# Patient Record
Sex: Female | Born: 1985 | Race: Black or African American | Hispanic: No | Marital: Single | Smoking: Former smoker
Health system: Southern US, Community
[De-identification: ages and names within clinical notes are randomized; demographics above are authoritative.]

## PROBLEM LIST (undated history)

## (undated) DIAGNOSIS — M25519 Pain in unspecified shoulder: Secondary | ICD-10-CM

## (undated) DIAGNOSIS — D649 Anemia, unspecified: Secondary | ICD-10-CM

## (undated) DIAGNOSIS — F419 Anxiety disorder, unspecified: Secondary | ICD-10-CM

## (undated) HISTORY — PX: EUSTACHIAN TUBE DILATION: SHX6770

## (undated) HISTORY — DX: Anemia, unspecified: D64.9

## (undated) HISTORY — DX: Anxiety disorder, unspecified: F41.9

## (undated) HISTORY — PX: TONSILLECTOMY: SUR1361

---

## 2003-01-11 ENCOUNTER — Emergency Department (HOSPITAL_COMMUNITY): Admission: EM | Admit: 2003-01-11 | Discharge: 2003-01-11 | Payer: Self-pay | Admitting: Emergency Medicine

## 2003-02-08 ENCOUNTER — Encounter: Admission: RE | Admit: 2003-02-08 | Discharge: 2003-02-08 | Payer: Self-pay | Admitting: Cardiology

## 2003-02-08 ENCOUNTER — Encounter: Payer: Self-pay | Admitting: Cardiology

## 2003-06-05 ENCOUNTER — Emergency Department (HOSPITAL_COMMUNITY): Admission: AD | Admit: 2003-06-05 | Discharge: 2003-06-05 | Payer: Self-pay | Admitting: Family Medicine

## 2003-08-31 ENCOUNTER — Emergency Department (HOSPITAL_COMMUNITY): Admission: AD | Admit: 2003-08-31 | Discharge: 2003-08-31 | Payer: Self-pay | Admitting: Family Medicine

## 2005-01-14 ENCOUNTER — Ambulatory Visit (HOSPITAL_COMMUNITY): Admission: RE | Admit: 2005-01-14 | Discharge: 2005-01-14 | Payer: Self-pay | Admitting: Obstetrics & Gynecology

## 2005-01-22 HISTORY — PX: DILATION AND EVACUATION: SHX1459

## 2005-11-01 ENCOUNTER — Emergency Department (HOSPITAL_COMMUNITY): Admission: EM | Admit: 2005-11-01 | Discharge: 2005-11-01 | Payer: Self-pay | Admitting: Family Medicine

## 2006-05-09 ENCOUNTER — Emergency Department (HOSPITAL_COMMUNITY): Admission: EM | Admit: 2006-05-09 | Discharge: 2006-05-09 | Payer: Self-pay | Admitting: Family Medicine

## 2006-12-27 ENCOUNTER — Emergency Department (HOSPITAL_COMMUNITY): Admission: EM | Admit: 2006-12-27 | Discharge: 2006-12-27 | Payer: Self-pay | Admitting: Family Medicine

## 2007-02-15 ENCOUNTER — Ambulatory Visit (HOSPITAL_COMMUNITY): Admission: RE | Admit: 2007-02-15 | Discharge: 2007-02-15 | Payer: Self-pay | Admitting: Family Medicine

## 2007-03-15 ENCOUNTER — Ambulatory Visit (HOSPITAL_COMMUNITY): Admission: RE | Admit: 2007-03-15 | Discharge: 2007-03-15 | Payer: Self-pay | Admitting: Obstetrics & Gynecology

## 2007-03-24 ENCOUNTER — Ambulatory Visit (HOSPITAL_COMMUNITY): Admission: RE | Admit: 2007-03-24 | Discharge: 2007-03-24 | Payer: Self-pay | Admitting: Obstetrics & Gynecology

## 2007-04-13 ENCOUNTER — Ambulatory Visit (HOSPITAL_COMMUNITY): Admission: RE | Admit: 2007-04-13 | Discharge: 2007-04-13 | Payer: Self-pay | Admitting: Obstetrics & Gynecology

## 2007-05-11 ENCOUNTER — Ambulatory Visit (HOSPITAL_COMMUNITY): Admission: RE | Admit: 2007-05-11 | Discharge: 2007-05-11 | Payer: Self-pay | Admitting: Obstetrics & Gynecology

## 2007-05-24 ENCOUNTER — Emergency Department (HOSPITAL_COMMUNITY): Admission: EM | Admit: 2007-05-24 | Discharge: 2007-05-24 | Payer: Self-pay | Admitting: Emergency Medicine

## 2007-06-02 ENCOUNTER — Inpatient Hospital Stay (HOSPITAL_COMMUNITY): Admission: AD | Admit: 2007-06-02 | Discharge: 2007-06-28 | Payer: Self-pay | Admitting: Family Medicine

## 2007-06-06 ENCOUNTER — Encounter: Payer: Self-pay | Admitting: Obstetrics & Gynecology

## 2007-06-07 ENCOUNTER — Encounter: Payer: Self-pay | Admitting: Obstetrics & Gynecology

## 2007-06-08 ENCOUNTER — Encounter: Payer: Self-pay | Admitting: Obstetrics & Gynecology

## 2007-06-16 ENCOUNTER — Encounter: Payer: Self-pay | Admitting: Obstetrics & Gynecology

## 2007-06-28 ENCOUNTER — Encounter: Payer: Self-pay | Admitting: Obstetrics & Gynecology

## 2007-07-02 ENCOUNTER — Inpatient Hospital Stay (HOSPITAL_COMMUNITY): Admission: AD | Admit: 2007-07-02 | Discharge: 2007-07-05 | Payer: Self-pay | Admitting: Obstetrics & Gynecology

## 2007-07-02 ENCOUNTER — Encounter: Payer: Self-pay | Admitting: Obstetrics & Gynecology

## 2008-08-26 ENCOUNTER — Emergency Department (HOSPITAL_COMMUNITY): Admission: EM | Admit: 2008-08-26 | Discharge: 2008-08-27 | Payer: Self-pay | Admitting: Emergency Medicine

## 2008-12-07 IMAGING — US US OB DETAIL+14 WK
1 series · 14 of 28 positions shown · non-contrast
Comparison: none

OBSTETRICAL ULTRASOUND:
 This ultrasound was performed in The [HOSPITAL], and the [facility] report will be stored to [REDACTED] PACS.

[Series 1: us ob detail+14 wk · 14 of 145 slices shown]
[im 6/145]
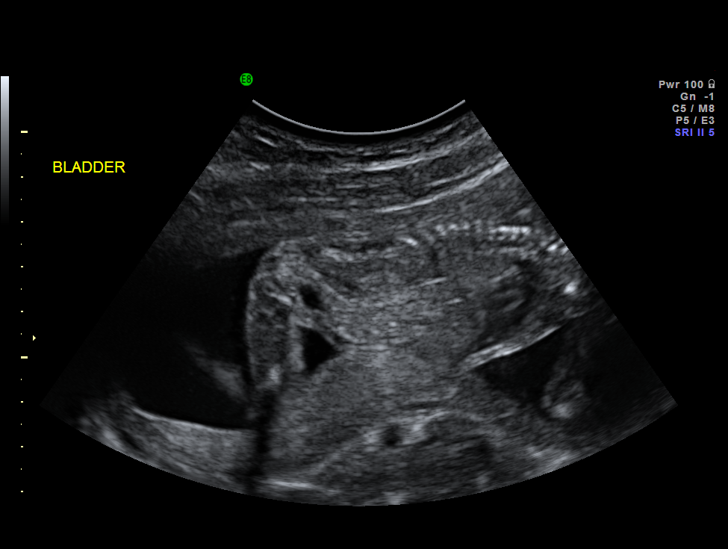
[im 17/145]
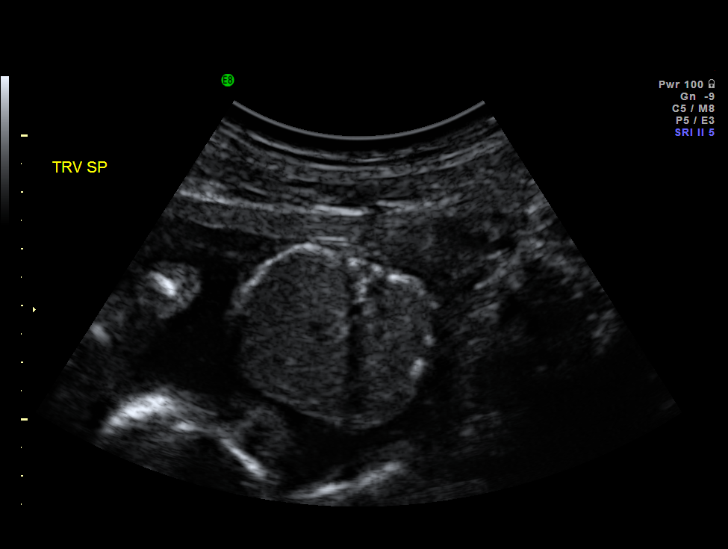
[im 27/145]
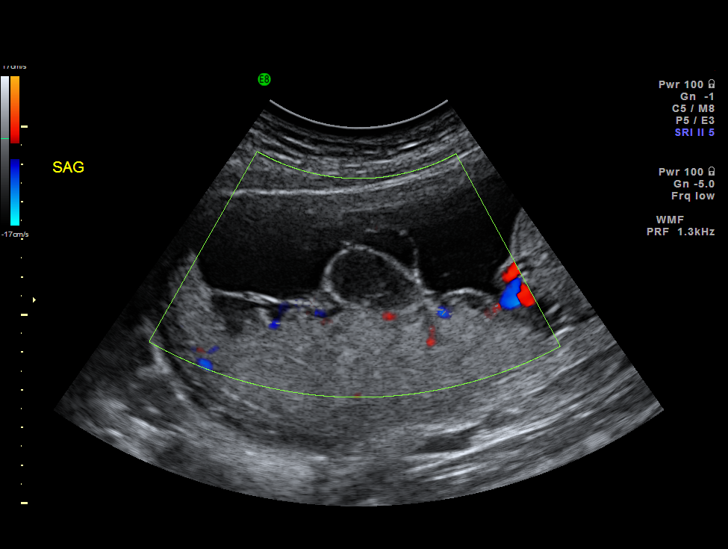
[im 38/145]
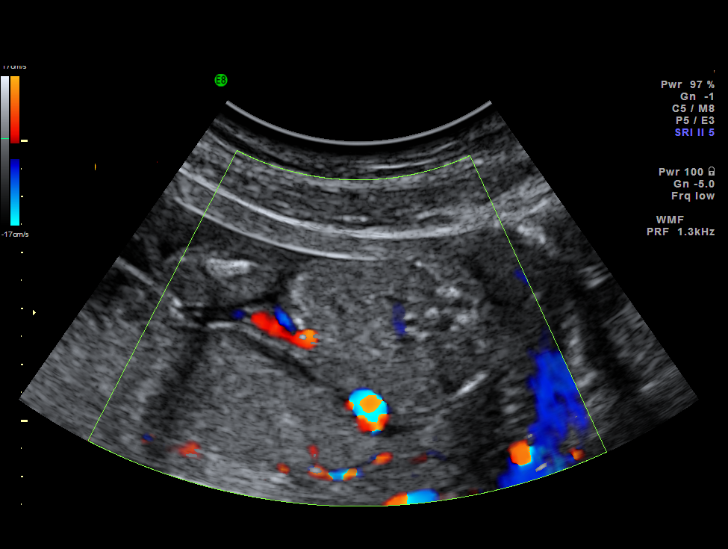
[im 49/145]
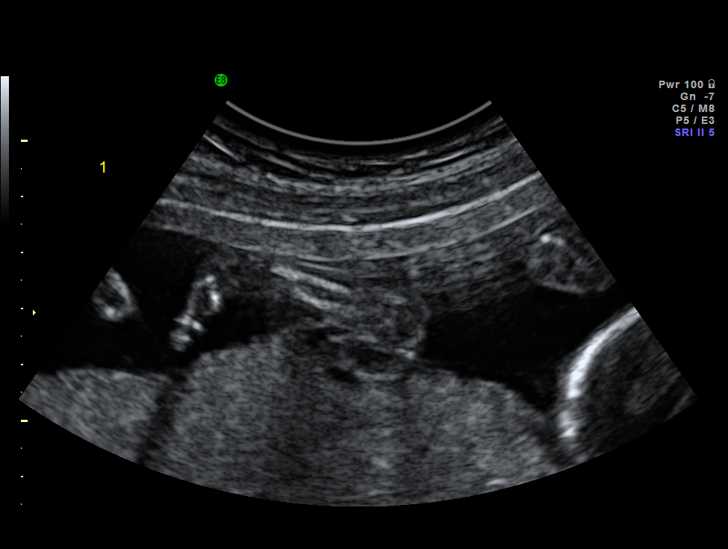
[im 59/145]
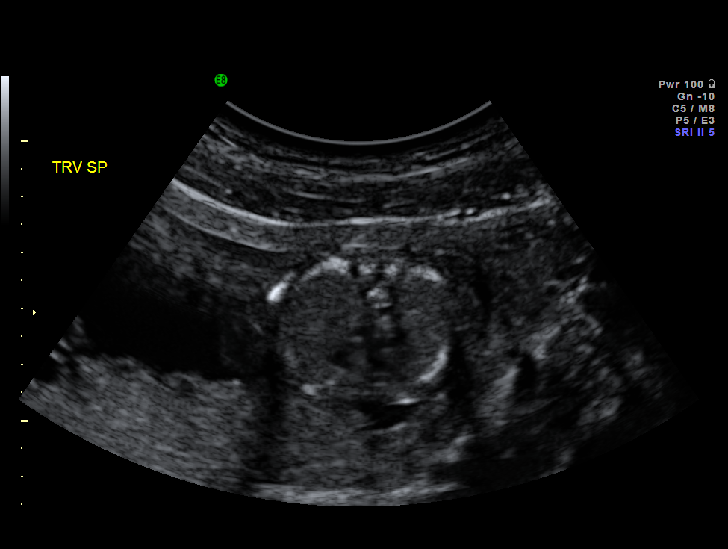
[im 70/145]
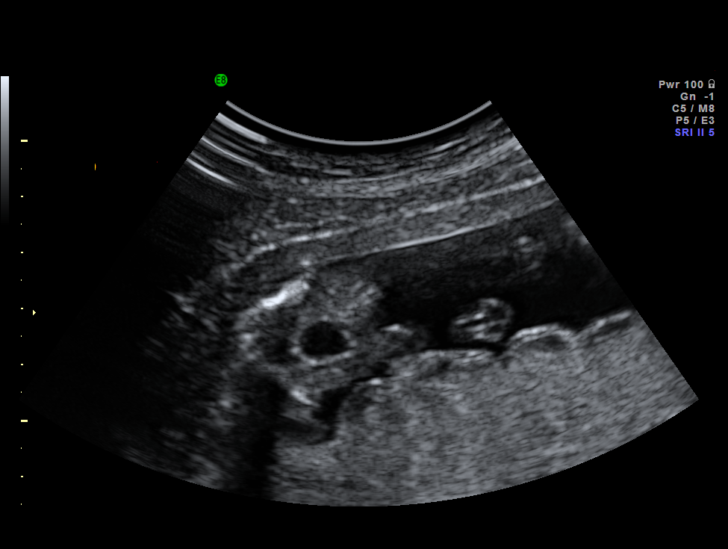
[im 81/145]
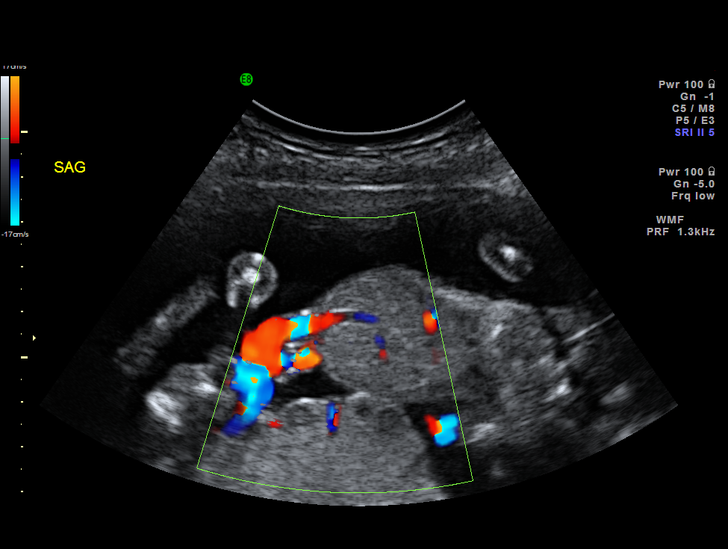
[im 91/145]
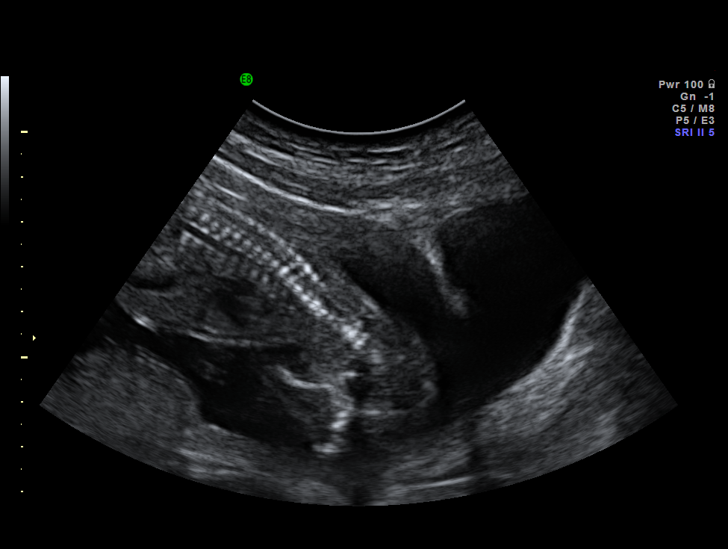
[im 102/145]
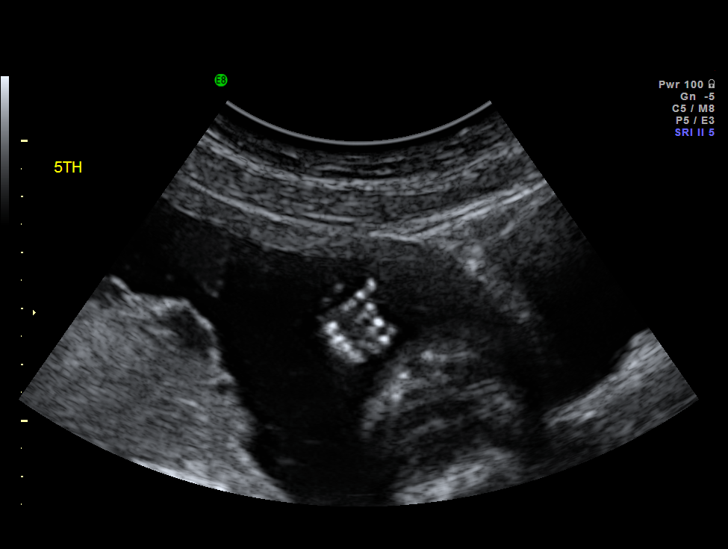
[im 113/145]
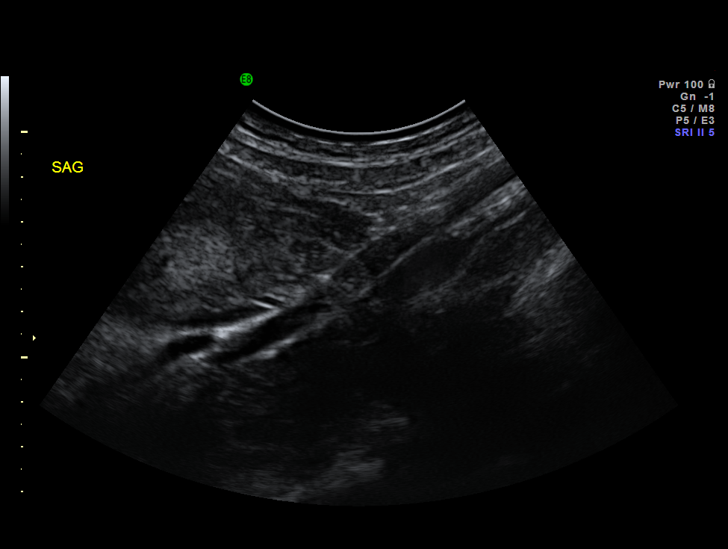
[im 123/145]
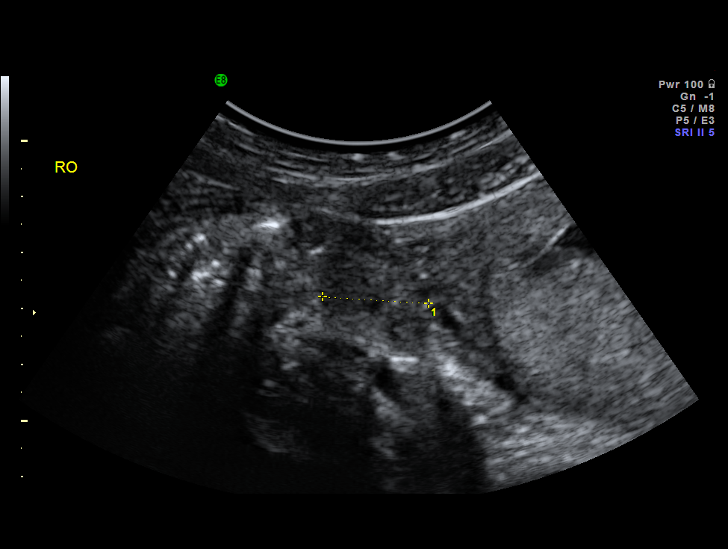
[im 134/145]
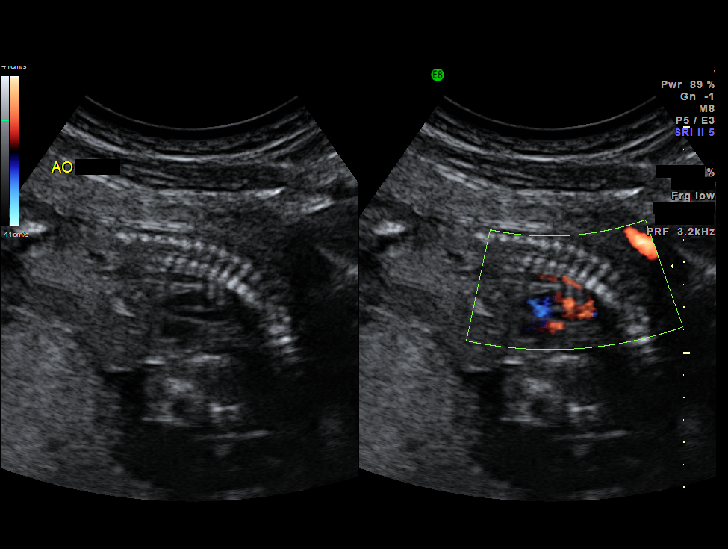
[im 145/145]
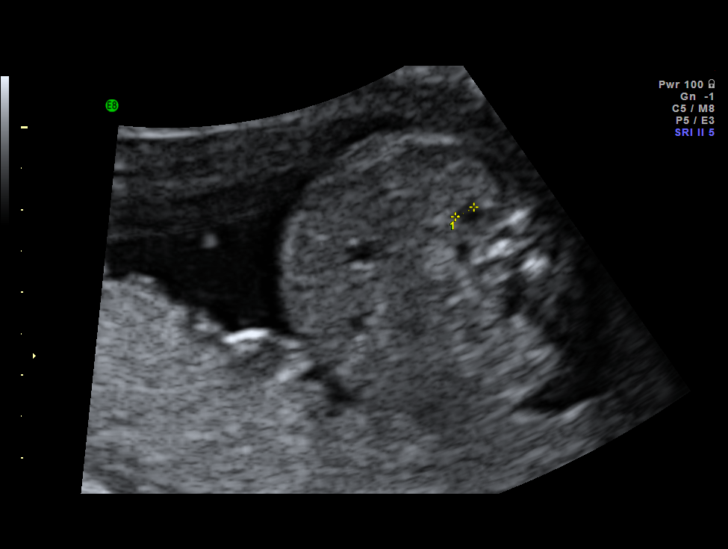

[14 of 28 positions shown; findings below may reference images not displayed]

IMPRESSION: The [facility] report has also been faxed to the ordering physician.

## 2009-03-08 ENCOUNTER — Emergency Department (HOSPITAL_COMMUNITY): Admission: EM | Admit: 2009-03-08 | Discharge: 2009-03-08 | Payer: Self-pay | Admitting: Emergency Medicine

## 2009-10-08 ENCOUNTER — Ambulatory Visit: Payer: Self-pay | Admitting: Internal Medicine

## 2009-10-24 ENCOUNTER — Ambulatory Visit (HOSPITAL_COMMUNITY): Admission: RE | Admit: 2009-10-24 | Discharge: 2009-10-24 | Payer: Self-pay | Admitting: *Deleted

## 2009-12-17 ENCOUNTER — Ambulatory Visit: Payer: Self-pay | Admitting: Obstetrics & Gynecology

## 2009-12-18 ENCOUNTER — Encounter (INDEPENDENT_AMBULATORY_CARE_PROVIDER_SITE_OTHER): Payer: Self-pay | Admitting: *Deleted

## 2009-12-18 LAB — CONVERTED CEMR LAB: Trich, Wet Prep: NONE SEEN

## 2010-01-29 ENCOUNTER — Ambulatory Visit: Payer: Self-pay | Admitting: Obstetrics & Gynecology

## 2010-01-29 ENCOUNTER — Ambulatory Visit (HOSPITAL_COMMUNITY): Admission: RE | Admit: 2010-01-29 | Discharge: 2010-01-29 | Payer: Self-pay | Admitting: Obstetrics & Gynecology

## 2010-02-19 ENCOUNTER — Ambulatory Visit: Payer: Self-pay | Admitting: Obstetrics and Gynecology

## 2010-06-18 ENCOUNTER — Emergency Department (HOSPITAL_COMMUNITY)
Admission: EM | Admit: 2010-06-18 | Discharge: 2010-06-18 | Payer: Self-pay | Source: Home / Self Care | Admitting: Emergency Medicine

## 2010-07-19 ENCOUNTER — Encounter: Payer: Self-pay | Admitting: Obstetrics & Gynecology

## 2010-07-19 ENCOUNTER — Encounter: Payer: Self-pay | Admitting: *Deleted

## 2010-07-20 ENCOUNTER — Encounter: Payer: Self-pay | Admitting: Obstetrics

## 2010-09-07 LAB — URINALYSIS, ROUTINE W REFLEX MICROSCOPIC
Glucose, UA: NEGATIVE mg/dL
Protein, ur: NEGATIVE mg/dL
Specific Gravity, Urine: 1.014 (ref 1.005–1.030)

## 2010-09-07 LAB — URINE MICROSCOPIC-ADD ON

## 2010-09-11 LAB — PREGNANCY, URINE: Preg Test, Ur: NEGATIVE

## 2010-09-11 LAB — CBC
HCT: 36.3 % (ref 36.0–46.0)
Hemoglobin: 11.8 g/dL — ABNORMAL LOW (ref 12.0–15.0)
MCHC: 32.5 g/dL (ref 30.0–36.0)
MCV: 79.8 fL (ref 78.0–100.0)
WBC: 5.7 10*3/uL (ref 4.0–10.5)

## 2010-10-02 LAB — GC/CHLAMYDIA PROBE AMP, GENITAL: Chlamydia, DNA Probe: NEGATIVE

## 2010-10-02 LAB — POCT URINALYSIS DIP (DEVICE)
Bilirubin Urine: NEGATIVE
Hgb urine dipstick: NEGATIVE
Ketones, ur: NEGATIVE mg/dL
Protein, ur: NEGATIVE mg/dL
pH: 6 (ref 5.0–8.0)

## 2010-10-02 LAB — RPR: RPR Ser Ql: NONREACTIVE

## 2010-10-02 LAB — URINALYSIS, ROUTINE W REFLEX MICROSCOPIC
Nitrite: NEGATIVE
Protein, ur: NEGATIVE mg/dL
Urobilinogen, UA: 0.2 mg/dL (ref 0.0–1.0)

## 2010-10-02 LAB — WET PREP, GENITAL: Yeast Wet Prep HPF POC: NONE SEEN

## 2010-10-08 LAB — POCT I-STAT, CHEM 8
BUN: 8 mg/dL (ref 6–23)
Calcium, Ion: 1.19 mmol/L (ref 1.12–1.32)
Chloride: 106 mEq/L (ref 96–112)
Creatinine, Ser: 0.8 mg/dL (ref 0.4–1.2)
Glucose, Bld: 77 mg/dL (ref 70–99)
HCT: 38 % (ref 36.0–46.0)
Hemoglobin: 12.9 g/dL (ref 12.0–15.0)
Potassium: 3.5 meq/L (ref 3.5–5.1)
Sodium: 141 meq/L (ref 135–145)
TCO2: 24 mmol/L (ref 0–100)

## 2010-10-08 LAB — DIFFERENTIAL
Basophils Relative: 0 % (ref 0–1)
Monocytes Absolute: 0.6 10*3/uL (ref 0.1–1.0)
Monocytes Relative: 10 % (ref 3–12)
Neutro Abs: 2.9 10*3/uL (ref 1.7–7.7)

## 2010-10-08 LAB — URINALYSIS, ROUTINE W REFLEX MICROSCOPIC
Bilirubin Urine: NEGATIVE
Glucose, UA: NEGATIVE mg/dL
Hgb urine dipstick: NEGATIVE
Nitrite: NEGATIVE
Specific Gravity, Urine: 1.02 (ref 1.005–1.030)
pH: 8.5 — ABNORMAL HIGH (ref 5.0–8.0)

## 2010-10-08 LAB — CBC
Hemoglobin: 11.2 g/dL — ABNORMAL LOW (ref 12.0–15.0)
MCHC: 33.1 g/dL (ref 30.0–36.0)
RBC: 4.34 MIL/uL (ref 3.87–5.11)

## 2010-10-08 LAB — POCT PREGNANCY, URINE: Preg Test, Ur: NEGATIVE
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
## 2010-12-21 ENCOUNTER — Inpatient Hospital Stay (INDEPENDENT_AMBULATORY_CARE_PROVIDER_SITE_OTHER)
Admission: RE | Admit: 2010-12-21 | Discharge: 2010-12-21 | Disposition: A | Discharge status: Home / Self Care | Payer: Self-pay | Source: Ambulatory Visit | Attending: Family Medicine | Admitting: Family Medicine

## 2010-12-21 DIAGNOSIS — K047 Periapical abscess without sinus: Secondary | ICD-10-CM

## 2010-12-21 DIAGNOSIS — B86 Scabies: Secondary | ICD-10-CM

## 2011-03-18 LAB — CBC
HCT: 31.3 — ABNORMAL LOW
Hemoglobin: 10.2 — ABNORMAL LOW
MCHC: 32.5
MCV: 79.3
Platelets: 205
RBC: 3.9
RDW: 13.8
RDW: 14.1
WBC: 8.6

## 2011-03-18 LAB — URINE MICROSCOPIC-ADD ON

## 2011-03-18 LAB — URINALYSIS, ROUTINE W REFLEX MICROSCOPIC
Glucose, UA: NEGATIVE
Leukocytes, UA: NEGATIVE
Specific Gravity, Urine: <1.005 — ABNORMAL LOW

## 2011-03-18 LAB — RPR: RPR Ser Ql: NONREACTIVE

## 2011-04-02 LAB — GLUCOSE, FASTING GESTATIONAL: Glucose Tolerance, Fasting: 85

## 2011-04-02 LAB — GLUCOSE, 3 HOUR GESTATIONAL: Glucose, GTT - 3 Hour: 108

## 2011-04-02 LAB — GLUCOSE, 1 HOUR GESTATIONAL: Glucose Tolerance, 1 hour: 156

## 2011-04-05 LAB — CBC
Hemoglobin: 10.3 — ABNORMAL LOW
MCHC: 32.5
Platelets: 209
Platelets: 211
RBC: 3.95
RDW: 14
WBC: 9.4

## 2011-04-05 LAB — MAGNESIUM
Magnesium: 4.7 — ABNORMAL HIGH
Magnesium: 5.4 — ABNORMAL HIGH

## 2011-04-05 LAB — SAMPLE TO BLOOD BANK

## 2011-04-05 LAB — COMPREHENSIVE METABOLIC PANEL
ALT: 12
Albumin: 2.4 — ABNORMAL LOW
Calcium: 8.7
GFR calc Af Amer: >60
Glucose, Bld: 86
Sodium: 135
Total Protein: 5.4 — ABNORMAL LOW

## 2011-04-05 LAB — URINALYSIS, ROUTINE W REFLEX MICROSCOPIC
Glucose, UA: NEGATIVE
Ketones, ur: NEGATIVE
Nitrite: NEGATIVE
Protein, ur: NEGATIVE

## 2011-04-05 LAB — LIPASE, BLOOD: Lipase: 21

## 2011-04-05 LAB — URINE CULTURE
Colony Count: NO GROWTH
Culture: NO GROWTH

## 2011-04-05 LAB — AMYLASE: Amylase: 56

## 2011-04-05 LAB — WET PREP, GENITAL: Yeast Wet Prep HPF POC: NONE SEEN

## 2011-04-06 LAB — URINALYSIS, ROUTINE W REFLEX MICROSCOPIC
Bilirubin Urine: NEGATIVE
Glucose, UA: NEGATIVE
Nitrite: NEGATIVE
Specific Gravity, Urine: 1.025
pH: 7

## 2011-04-06 LAB — I-STAT 8, (EC8 V) (CONVERTED LAB)
Acid-base deficit: 1
Chloride: 105
HCT: 34 — ABNORMAL LOW
Hemoglobin: 11.6 — ABNORMAL LOW
Operator id: 234501
Potassium: 3.8

## 2011-04-06 LAB — URINE MICROSCOPIC-ADD ON

## 2011-04-06 LAB — RPR: RPR Ser Ql: NONREACTIVE

## 2011-04-06 LAB — POCT I-STAT CREATININE: Operator id: 234501

## 2011-04-06 LAB — WET PREP, GENITAL: WBC, Wet Prep HPF POC: NONE SEEN

## 2011-04-13 LAB — POCT URINALYSIS DIP (DEVICE)
Glucose, UA: NEGATIVE
Hgb urine dipstick: NEGATIVE
Nitrite: NEGATIVE
Operator id: 282151
Urobilinogen, UA: 1

## 2011-04-13 LAB — HIV ANTIBODY (ROUTINE TESTING W REFLEX): HIV: NONREACTIVE

## 2011-04-13 LAB — GC/CHLAMYDIA PROBE AMP, GENITAL: Chlamydia, DNA Probe: NEGATIVE

## 2011-04-13 LAB — HEPATITIS B SURFACE ANTIBODY,QUALITATIVE: Hep B S Ab: POSITIVE — AB

## 2011-04-13 LAB — HSV 2 ANTIBODY, IGG: HSV 2 Glycoprotein G Ab, IgG: 0.22

## 2011-04-13 LAB — WET PREP, GENITAL
Trich, Wet Prep: NONE SEEN
WBC, Wet Prep HPF POC: NONE SEEN
Yeast Wet Prep HPF POC: NONE SEEN

## 2011-04-13 LAB — HEPATITIS C ANTIBODY: HCV Ab: NEGATIVE

## 2011-04-13 LAB — HSV 1 ANTIBODY, IGG: HSV 1 Glycoprotein G Ab, IgG: 5.03 — ABNORMAL HIGH

## 2011-05-02 DIAGNOSIS — W07XXXA Fall from chair, initial encounter: Secondary | ICD-10-CM | POA: Insufficient documentation

## 2011-05-02 DIAGNOSIS — M542 Cervicalgia: Secondary | ICD-10-CM | POA: Insufficient documentation

## 2011-05-02 DIAGNOSIS — S139XXA Sprain of joints and ligaments of unspecified parts of neck, initial encounter: Secondary | ICD-10-CM | POA: Insufficient documentation

## 2011-05-02 DIAGNOSIS — Y92009 Unspecified place in unspecified non-institutional (private) residence as the place of occurrence of the external cause: Secondary | ICD-10-CM | POA: Insufficient documentation

## 2011-05-03 ENCOUNTER — Emergency Department (HOSPITAL_COMMUNITY)
Admission: EM | Admit: 2011-05-03 | Discharge: 2011-05-03 | Disposition: A | Discharge status: Home / Self Care | Payer: Self-pay | Attending: Emergency Medicine | Admitting: Emergency Medicine

## 2011-05-03 ENCOUNTER — Emergency Department (HOSPITAL_COMMUNITY): Payer: Self-pay

## 2011-05-03 DIAGNOSIS — S161XXA Strain of muscle, fascia and tendon at neck level, initial encounter: Secondary | ICD-10-CM

## 2011-05-03 MED ORDER — KETOROLAC TROMETHAMINE 60 MG/2ML IM SOLN: 60.0000 mg | Freq: Once | INTRAMUSCULAR | Status: DC

## 2011-05-03 MED ORDER — CYCLOBENZAPRINE HCL 10 MG PO TABS: 10.0000 mg | ORAL_TABLET | Freq: Three times a day (TID) | ORAL | Status: AC | PRN

## 2011-05-03 MED ORDER — KETOROLAC TROMETHAMINE 30 MG/ML IJ SOLN
INTRAMUSCULAR | Status: AC
  Administered 2011-05-03: 60 mg via INTRAMUSCULAR
  Filled 2011-05-03: qty 2

## 2011-05-03 NOTE — ED Provider Notes (Addendum)
History     CSN: [id number] Arrival date & time: 05/03/2011 12:02 AM   First MD Initiated Contact with Patient 05/03/11 0451      Chief Complaint  Patient presents with  . Muscle Pain  . Neck Pain    (Consider location/radiation/quality/duration/timing/severity/associated sxs/prior treatment) HPI Comments: She states that she fell off a chair while playing with her family 3 weeks ago. This has been constant pain on the sides of her neck, trapezius muscles and in the center of her neck. She denies weakness numbness or difficulty walking  Patient is a 25 y.o. female presenting with neck injury. The history is provided by the patient.  Neck Injury Chronicity: 3 weeks ago. Episode onset: 3 weeks ago. The problem occurs constantly. The problem has not changed since onset.Pertinent negatives include no chest pain, no abdominal pain, no headaches and no shortness of breath. The symptoms are aggravated by bending and twisting. The symptoms are relieved by rest. She has tried acetaminophen for the symptoms. The treatment provided no relief.    History reviewed. No pertinent past medical history.  History reviewed. No pertinent past surgical history.  History reviewed. No pertinent family history.  History  Substance Use Topics  . Smoking status: Not on file  . Smokeless tobacco: Not on file  . Alcohol Use: Not on file    OB History    Grav Para Term Preterm Abortions TAB SAB Ect Mult Living                  Review of Systems  HENT: Positive for neck pain.   Respiratory: Negative for shortness of breath.   Cardiovascular: Negative for chest pain.  Gastrointestinal: Negative for abdominal pain.  Neurological: Negative for headaches.    Allergies  Review of patient's allergies indicates no known allergies.  Home Medications   Current Outpatient Rx  Name Route Sig Dispense Refill  . ACETAMINOPHEN 325 MG PO TABS Oral Take 650 mg by mouth every 6 (six) hours as needed. FOR  PAIN     . IBUPROFEN 200 MG PO TABS Oral Take 400 mg by mouth every 6 (six) hours as needed. FOR PAIN     . NAPROXEN SODIUM 220 MG PO TABS Oral Take 440 mg by mouth every 6 (six) hours as needed. FOR PAIN     . CYCLOBENZAPRINE HCL 10 MG PO TABS Oral Take 1 tablet (10 mg total) by mouth 3 (three) times daily as needed for muscle spasms. 20 tablet 0    BP 107/60  Pulse 60  Temp(Src) 98.3 F (36.8 C) (Oral)  Resp 18  Wt 127 lb (57.607 kg)  SpO2 100%  Physical Exam  Nursing note and vitals reviewed. Constitutional: She appears well-developed and well-nourished. No distress.  HENT:  Head: Normocephalic and atraumatic.  Mouth/Throat: Oropharynx is clear and moist. No oropharyngeal exudate.  Eyes: Conjunctivae and EOM are normal. Pupils are equal, round, and reactive to light. Right eye exhibits no discharge. Left eye exhibits no discharge. No scleral icterus.  Neck: Normal range of motion. Neck supple. No JVD present. No thyromegaly present.  Cardiovascular: Normal rate, regular rhythm, normal heart sounds and intact distal pulses.  Exam reveals no gallop and no friction rub.   No murmur heard. Pulmonary/Chest: Effort normal and breath sounds normal. No respiratory distress. She has no wheezes. She has no rales.  Abdominal: Soft. Bowel sounds are normal. She exhibits no distension and no mass. There is no tenderness.  Musculoskeletal: Normal range of  motion. She exhibits tenderness ( Tender to palpation in the bilateral trapezius and paraspinal muscles of the cervical spine. No other spinal tenderness to palpation). She exhibits no edema.  Lymphadenopathy:    She has no cervical adenopathy.  Neurological: She is alert. Coordination normal.  Skin: Skin is warm and dry. No rash noted. No erythema.  Psychiatric: She has a normal mood and affect. Her behavior is normal.    ED Course  Procedures (including critical care time)  Labs Reviewed - No data to display Dg Cervical Spine  Complete  05/03/2011  *RADIOLOGY REPORT*  Clinical Data: Trauma, posterior neck pain.  CERVICAL SPINE - COMPLETE 4+ VIEW  Comparison: None.  Findings: The imaged vertebral bodies and inter-vertebral disc spaces are maintained. No displaced acute fracture or dislocation identified.   The para-vertebral and overlying soft tissues are within normal limits.  Loss of lordosis may reflect positioning or muscle spasm.  Maintained C1-2 articulation.  IMPRESSION: No acute osseous abnormality.  Loss of lordosis may be positional or secondary to muscle spasm.  Original Report Authenticated By: Waneta Martins, M.D.     1. Cervical strain       MDM  Normal neurologic exam, strength, sensation, gait. Tenderness reproducible on my exam. We'll rule out spinal injury, treat with anti-inflammatories.      xrays negative.  Pt to be d/c home with muscle relaxer in addition to NSAIDs.  Vida Roller, MD 05/03/11 4098  Vida Roller, MD 05/03/11 [id number]

## 2011-05-03 NOTE — ED Notes (Signed)
Rx, given for flexeril.

## 2011-05-03 NOTE — ED Notes (Signed)
Pt complains of neck and back pain for three weeks, was playing with her neices and injured herself, states that the pain is unbearable

## 2013-06-30 ENCOUNTER — Emergency Department (HOSPITAL_COMMUNITY)
Admission: EM | Admit: 2013-06-30 | Discharge: 2013-06-30 | Disposition: A | Discharge status: Home / Self Care | Payer: Self-pay | Attending: Emergency Medicine | Admitting: Emergency Medicine

## 2013-06-30 ENCOUNTER — Encounter (HOSPITAL_COMMUNITY): Payer: Self-pay | Admitting: Emergency Medicine

## 2013-06-30 DIAGNOSIS — Z3202 Encounter for pregnancy test, result negative: Secondary | ICD-10-CM | POA: Insufficient documentation

## 2013-06-30 DIAGNOSIS — M549 Dorsalgia, unspecified: Secondary | ICD-10-CM | POA: Insufficient documentation

## 2013-06-30 DIAGNOSIS — R3919 Other difficulties with micturition: Secondary | ICD-10-CM | POA: Insufficient documentation

## 2013-06-30 LAB — URINE MICROSCOPIC-ADD ON

## 2013-06-30 LAB — URINALYSIS, ROUTINE W REFLEX MICROSCOPIC
Bilirubin Urine: NEGATIVE
GLUCOSE, UA: NEGATIVE mg/dL
Hgb urine dipstick: NEGATIVE
Ketones, ur: NEGATIVE mg/dL
Nitrite: NEGATIVE
PH: 7.5 (ref 5.0–8.0)
Protein, ur: NEGATIVE mg/dL
SPECIFIC GRAVITY, URINE: 1.019 (ref 1.005–1.030)
Urobilinogen, UA: 0.2 mg/dL (ref 0.0–1.0)

## 2013-06-30 LAB — PREGNANCY, URINE: PREG TEST UR: NEGATIVE

## 2013-06-30 MED ORDER — HYDROCODONE-ACETAMINOPHEN 5-325 MG PO TABS
1.0000 | ORAL_TABLET | Freq: Once | ORAL | Status: AC
  Administered 2013-06-30: 1 via ORAL
  Filled 2013-06-30: qty 1

## 2013-06-30 MED ORDER — CYCLOBENZAPRINE HCL 10 MG PO TABS: 10.0000 mg | ORAL_TABLET | Freq: Two times a day (BID) | ORAL | Status: DC | PRN

## 2013-06-30 MED ORDER — IBUPROFEN 800 MG PO TABS: 800.0000 mg | ORAL_TABLET | Freq: Three times a day (TID) | ORAL | Status: DC

## 2013-06-30 MED ORDER — METRONIDAZOLE 500 MG PO TABS
2000.0000 mg | ORAL_TABLET | Freq: Once | ORAL | Status: AC
  Administered 2013-06-30: 2000 mg via ORAL
  Filled 2013-06-30: qty 4

## 2013-06-30 MED ORDER — HYDROCODONE-ACETAMINOPHEN 5-325 MG PO TABS: 1.0000 | ORAL_TABLET | ORAL | Status: DC | PRN

## 2013-06-30 NOTE — ED Provider Notes (Signed)
CSN: [id number]     Arrival date & time 06/30/13  1741 History  This chart was scribed for non-physician practitioner, Elpidio AnisShari Syris Brookens, PA-C,working with Layla MawKristen N Ward, DO, by Karle PlumberJennifer Tensley, ED Scribe.  This patient was seen in room WTR9/WTR9 and the patient's care was started at 6:54 PM.  Chief Complaint  Patient presents with  . Back Pain   The history is provided by the patient. No language interpreter was used.   HPI Comments:  Ann Clark is a 28 y.o. female who presents to the Emergency Department complaining of worsening chronic back pain for the past 3-4 days. Pt states she has had back problems for the past year. 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 She states she has some trouble urinating secondary to it being hard to sit up. She states she does not have a PCP.  History reviewed. No pertinent past medical history. History reviewed. No pertinent past surgical history. History reviewed. No pertinent family history. History  Substance Use Topics  . Smoking status: Not on file  . Smokeless tobacco: Not on file  . Alcohol Use: Not on file   OB History   Grav Para Term Preterm Abortions TAB SAB Ect Mult Living                 Review of Systems  Musculoskeletal: Positive for back pain.  All other systems reviewed and are negative.    Allergies  Review of patient's allergies indicates no known allergies.  Home Medications   Current Outpatient Rx  Name  Route  Sig  Dispense  Refill  . acetaminophen (TYLENOL) 325 MG tablet   Oral   Take 650 mg by mouth every 6 (six) hours as needed. FOR PAIN          . ibuprofen (ADVIL,MOTRIN) 200 MG tablet   Oral   Take 400 mg by mouth every 6 (six) hours as needed. FOR PAIN           BP 98/70  Pulse 75  Temp(Src) 97.9 F (36.6 C) (Oral)  Resp 16 Physical Exam  Nursing note and vitals reviewed. Constitutional: She is oriented to person, place, and time.  She appears well-developed and well-nourished.  HENT:  Head: Normocephalic and atraumatic.  Eyes: EOM are normal.  Neck: Normal range of motion.  Pulmonary/Chest: Effort normal.  Abdominal: Soft. There is no tenderness.  Genitourinary:  Minimal left flank tenderness to palpation.   Musculoskeletal: Normal range of motion.  No lumbar or other spinal tenderness. Fully weight bearing.   Neurological: She is alert and oriented to person, place, and time.  Skin: Skin is warm and dry.  Psychiatric: She has a normal mood and affect. Her behavior is normal.    ED Course  Procedures (including critical care time) DIAGNOSTIC STUDIES:    COORDINATION OF CARE: 6:57 PM- Will obtain urine for a urinalysis. Pt verbalizes understanding and agrees to plan.  Medications - No data to display  Labs Review Labs Reviewed  URINALYSIS, ROUTINE W REFLEX MICROSCOPIC - Abnormal; Notable for the following:    APPearance CLOUDY (*)    Leukocytes, UA SMALL (*)    All other components within normal limits  PREGNANCY, URINE  URINE MICROSCOPIC-ADD ON   Results for orders placed during the hospital encounter of 06/30/13  URINALYSIS, ROUTINE W REFLEX MICROSCOPIC      Result Value Range   Color, Urine YELLOW  YELLOW   APPearance CLOUDY (*) CLEAR   Specific Gravity, Urine 1.019  1.005 - 1.030   pH 7.5  5.0 - 8.0   Glucose, UA NEGATIVE  NEGATIVE mg/dL   Hgb urine dipstick NEGATIVE  NEGATIVE   Bilirubin Urine NEGATIVE  NEGATIVE   Ketones, ur NEGATIVE  NEGATIVE mg/dL   Protein, ur NEGATIVE  NEGATIVE mg/dL   Urobilinogen, UA 0.2  0.0 - 1.0 mg/dL   Nitrite NEGATIVE  NEGATIVE   Leukocytes, UA SMALL (*) NEGATIVE  PREGNANCY, URINE      Result Value Range   Preg Test, Ur NEGATIVE  NEGATIVE  URINE MICROSCOPIC-ADD ON      Result Value Range   Squamous Epithelial / LPF RARE  RARE   WBC, UA 7-10  <3 WBC/hpf   RBC / HPF 0-2  <3 RBC/hpf   Bacteria, UA RARE  RARE   Urine-Other TRICHOMONAS PRESENT       Imaging Review No results found.  EKG Interpretation   None       MDM  No diagnosis found. 1. Chronic back pain  Uncomplicated muscular back pain.  I personally performed the services described in this documentation, which was scribed in my presence. The recorded information has been reviewed and is accurate.     Arnoldo Hooker, PA-C 07/01/13 1459

## 2013-06-30 NOTE — ED Notes (Signed)
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

## 2013-06-30 NOTE — Discharge Instructions (Signed)
\Heat Therapy Heat therapy can help ease achy, tense, stiff, and tight muscles and joints. Heat should not be used on new injuries. Wait at least 48 hours after the injury before using heat therapy. Heat also should not be used for discomfort or pain that occurs right after doing an activity. If you still have pain or stiffness 3 hours after finishing the activity, then heat therapy may be used. PRECAUTIONS  High heat or prolonged exposure to heat can cause burns. Be careful when using heat therapy to avoid burning your skin. If you have any of the following conditions, do not use heat until you have discussed heat therapy with your caregiver:  Poor circulation.  Healing wounds or scarred skin in the area being treated.  Diabetes, heart disease, or high blood pressure.  Numbness of the area being treated.  Unusual swelling of the area being treated.  Active infections.  Blood clots.  Cancer.  Inability to communicate your response to pain. This can include young children and people with dementia. HOME CARE INSTRUCTIONS Moist heat pack  Soak a clean towel in warm water, and squeeze out the extra water. The water temperature should be comfortable to the skin.  Put the warm, wet towel in a plastic bag.  Place a thin, dry towel between your skin and the bag.  Put the heat pack on the area for 5 minutes, and check your skin. Your skin may be pink, but it should not be red.  Leave the heat pack on the area for a total of 15 to 30 minutes.  Repeat this every 2 to 4 hours while awake. Do not use heat while you are sleeping. Warm water bath  Fill a tub with warm water. The water temperature should be comfortable to the skin.  Place the affected body part in the tub.  Soak the area for 20 to 40 minutes.  Repeat as needed. Hot water bottle  Fill the water bottle half full with hot water.  Press out the extra air. Close the cap tightly.  Place a dry towel between your skin and  the bottle.  Put the bottle on the area for 5 minutes, and check your skin. Your skin may be pink, but it should not be red.  Leave the bottle on the area for a total of 15 to 30 minutes.  Repeat this every 2 to 4 hours while awake. Electric heating pad  Place a dry towel between your skin and the heating pad.  Set the heating pad on low heat.  Put the heating pad on the area for 10 minutes, and check your skin. Your skin may be pink, but it should not be red.  Leave the heating pad on the area for a total of 20 to 40 minutes.  Repeat this every 2 to 4 hours while awake.  Do not lie on the heating pad.  Do not fall asleep while using the heating pad.  Do not use the heating pad near water. Contact with water can result in an electrical shock. SEEK MEDICAL CARE IF:  You have blisters, redness, swelling, or numbness.  You have any new problems.  Your problems are getting worse.  You have any questions or concerns. If you develop any problems, stop using heat therapy until you see your caregiver. MAKE SURE YOU:  Understand these instructions.  Will watch your condition.  Will get help right away if you are not doing well or get worse. Document Released: 09/06/2011  Document Reviewed: 09/06/2011 [facility] Patient Information 2014 [place], [place]. Back Pain, Adult Low back pain is very common. About 1 in 5 people have back pain.The cause of low back pain is rarely dangerous. The pain often gets better over time.About half of people with a sudden onset of back pain feel better in just 2 weeks. About 8 in 10 people feel better by 6 weeks.  CAUSES Some common causes of back pain include:  Strain of the muscles or ligaments supporting the spine.  Wear and tear (degeneration) of the spinal discs.  Arthritis.  Direct injury to the back. DIAGNOSIS Most of the time, the direct cause of low back pain is not known.However, back pain can be treated effectively even when the  exact cause of the pain is unknown.Answering your caregiver's questions about your overall health and symptoms is one of the most accurate ways to make sure the cause of your pain is not dangerous. If your caregiver needs more information, he or she may order lab work or imaging tests (X-rays or MRIs).However, even if imaging tests show changes in your back, this usually does not require surgery. HOME CARE INSTRUCTIONS For many people, back pain returns.Since low back pain is rarely dangerous, it is often a condition that people can learn to [facility] their own.   Remain active. It is stressful on the back to sit or stand in one place. Do not sit, drive, or stand in one place for more than 30 minutes at a time. Take short walks on level surfaces as soon as pain allows.Try to increase the length of time you walk each day.  Do not stay in bed.Resting more than 1 or 2 days can delay your recovery.  Do not avoid exercise or work.Your body is made to move.It is not dangerous to be active, even though your back may hurt.Your back will likely heal faster if you return to being active before your pain is gone.  Pay attention to your body when you bend and lift. Many people have less discomfortwhen lifting if they bend their knees, keep the load close to their bodies,and avoid twisting. Often, the most comfortable positions are those that put less stress on your recovering back.  Find a comfortable position to sleep. Use a firm mattress and lie on your side with your knees slightly bent. If you lie on your back, put a pillow under your knees.  Only take over-the-counter or prescription medicines as directed by your caregiver. Over-the-counter medicines to reduce pain and inflammation are often the most helpful.Your caregiver may prescribe muscle relaxant drugs.These medicines help dull your pain so you can more quickly return to your normal activities and healthy exercise.  Put ice on the injured  area.  Put ice in a plastic bag.  Place a towel between your skin and the bag.  Leave the ice on for 15-20 minutes, 03-04 times a day for the first 2 to 3 days. After that, ice and heat may be alternated to reduce pain and spasms.  Ask your caregiver about trying back exercises and gentle massage. This may be of some benefit.  Avoid feeling anxious or stressed.Stress increases muscle tension and can worsen back pain.It is important to recognize when you are anxious or stressed and learn ways to manage it.Exercise is a great option. SEEK MEDICAL CARE IF:  You have pain that is not relieved with rest or medicine.  You have pain that does not improve in 1 week.  You have  new symptoms.  You are generally not feeling well. SEEK IMMEDIATE MEDICAL CARE IF:   You have pain that radiates from your back into your legs.  You develop new bowel or bladder control problems.  You have unusual weakness or numbness in your arms or legs.  You develop nausea or vomiting.  You develop abdominal pain.  You feel faint. Document Released: 06/14/2005 Document Revised: 12/14/2011 Document Reviewed: 11/02/2010 [facility] Patient Information 2014 [place], [place].

## 2013-07-01 NOTE — ED Provider Notes (Signed)
Medical screening examination/treatment/procedure(s) were performed by non-physician practitioner and as supervising physician I was immediately available for consultation/collaboration.  EKG Interpretation   None         Kaylany Tesoriero N Julien Berryman, DO 07/01/13 2341 

## 2014-09-25 ENCOUNTER — Emergency Department (HOSPITAL_COMMUNITY)
Admission: EM | Admit: 2014-09-25 | Discharge: 2014-09-25 | Disposition: A | Discharge status: Home / Self Care | Payer: Medicaid Other | Attending: Emergency Medicine | Admitting: Emergency Medicine

## 2014-09-25 ENCOUNTER — Encounter (HOSPITAL_COMMUNITY): Payer: Self-pay | Admitting: *Deleted

## 2014-09-25 DIAGNOSIS — Y999 Unspecified external cause status: Secondary | ICD-10-CM | POA: Insufficient documentation

## 2014-09-25 DIAGNOSIS — S199XXA Unspecified injury of neck, initial encounter: Secondary | ICD-10-CM | POA: Insufficient documentation

## 2014-09-25 DIAGNOSIS — Z720 Tobacco use: Secondary | ICD-10-CM | POA: Insufficient documentation

## 2014-09-25 DIAGNOSIS — Y939 Activity, unspecified: Secondary | ICD-10-CM | POA: Insufficient documentation

## 2014-09-25 DIAGNOSIS — S46911A Strain of unspecified muscle, fascia and tendon at shoulder and upper arm level, right arm, initial encounter: Secondary | ICD-10-CM | POA: Insufficient documentation

## 2014-09-25 DIAGNOSIS — X58XXXA Exposure to other specified factors, initial encounter: Secondary | ICD-10-CM | POA: Diagnosis not present

## 2014-09-25 DIAGNOSIS — Z791 Long term (current) use of non-steroidal anti-inflammatories (NSAID): Secondary | ICD-10-CM | POA: Insufficient documentation

## 2014-09-25 DIAGNOSIS — S4991XA Unspecified injury of right shoulder and upper arm, initial encounter: Secondary | ICD-10-CM | POA: Diagnosis present

## 2014-09-25 DIAGNOSIS — Y929 Unspecified place or not applicable: Secondary | ICD-10-CM | POA: Insufficient documentation

## 2014-09-25 DIAGNOSIS — S46811A Strain of other muscles, fascia and tendons at shoulder and upper arm level, right arm, initial encounter: Secondary | ICD-10-CM

## 2014-09-25 MED ORDER — CYCLOBENZAPRINE HCL 10 MG PO TABS: 10.0000 mg | ORAL_TABLET | Freq: Two times a day (BID) | ORAL | Status: DC | PRN

## 2014-09-25 MED ORDER — KETOROLAC TROMETHAMINE 60 MG/2ML IM SOLN
60.0000 mg | Freq: Once | INTRAMUSCULAR | Status: AC
  Administered 2014-09-25: 60 mg via INTRAMUSCULAR
  Filled 2014-09-25: qty 2

## 2014-09-25 MED ORDER — HYDROCODONE-ACETAMINOPHEN 5-325 MG PO TABS: 2.0000 | ORAL_TABLET | ORAL | Status: DC | PRN

## 2014-09-25 NOTE — ED Provider Notes (Signed)
CSN: [id number]     Arrival date & time 09/25/14  1219 History  This chart was scribed for non-physician practitioner, Emilia BeckKaitlyn Fernandez Kenley, working with Blane OharaJoshua Zavitz, MD by Richarda Overlieichard Holland, ED Scribe. This patient was seen in room TR10C/TR10C and the patient's care was started at 1:13 PM.     Chief Complaint  Patient presents with  . Muscle Pain   The history is provided by the patient. No language interpreter was used.   HPI Comments: Ann Clark is a 29 y.o. female who presents to the Emergency Department complaining of worsening right shoulder pain for the last 3 days. She states that she has pain in her right neck that radiates to her head as well. Pt sates that her pain worsened this morning after she sneezed. She states that she has tried bengay cream, ibuprofen, aleve and tylenol with no relief. She reports no similar prior episodes. Pt reports no alleviating or exacerbating factors at this time. She denies any numbness or weakness.   History reviewed. No pertinent past medical history. History reviewed. No pertinent past surgical history. No family history on file. History  Substance Use Topics  . Smoking status: Current Every Day Smoker  . Smokeless tobacco: Not on file  . Alcohol Use: Yes   OB History    No data available     Review of Systems  Musculoskeletal: Positive for myalgias and neck pain.  Neurological: Negative for weakness and numbness.  All other systems reviewed and are negative.   Allergies  Review of patient's allergies indicates no known allergies.  Home Medications   Prior to Admission medications   Medication Sig Start Date End Date Taking? Authorizing Provider  acetaminophen (TYLENOL) 325 MG tablet Take 650 mg by mouth every 6 (six) hours as needed. FOR PAIN     Historical Provider, MD  cyclobenzaprine (FLEXERIL) 10 MG tablet Take 1 tablet (10 mg total) by mouth 2 (two) times daily as needed for muscle spasms. 06/30/13   Elpidio AnisShari Upstill, PA-C   HYDROcodone-acetaminophen (NORCO/VICODIN) 5-325 MG per tablet Take 1-2 tablets by mouth every 4 (four) hours as needed. 06/30/13   Elpidio AnisShari Upstill, PA-C  ibuprofen (ADVIL,MOTRIN) 200 MG tablet Take 400 mg by mouth every 6 (six) hours as needed. FOR PAIN     Historical Provider, MD  ibuprofen (ADVIL,MOTRIN) 800 MG tablet Take 1 tablet (800 mg total) by mouth 3 (three) times daily. 06/30/13   Shari Upstill, PA-C   BP 103/67 mmHg  Pulse 80  Temp(Src) 97.6 F (36.4 C) (Oral)  Resp 18  Ht 5' (1.524 m)  Wt 135 lb (61.236 kg)  BMI 26.37 kg/m2  SpO2 100% Physical Exam  Constitutional: She is oriented to person, place, and time. She appears well-developed and well-nourished.  HENT:  Head: Normocephalic and atraumatic.  Eyes: Right eye exhibits no discharge. Left eye exhibits no discharge.  Neck: No tracheal deviation present.  Cardiovascular: Normal rate.   Pulmonary/Chest: Effort normal. No respiratory distress.  Abdominal: She exhibits no distension. There is no tenderness.  Musculoskeletal:  No midline spine tenderness to palpation. Right trapezius tenderness to palpation.   Neurological: She is alert and oriented to person, place, and time.  Skin: Skin is warm and dry.  Psychiatric: She has a normal mood and affect. Her behavior is normal.  Nursing note and vitals reviewed.   ED Course  Procedures   DIAGNOSTIC STUDIES: Oxygen Saturation is 100% on RA, normal by my interpretation.    COORDINATION OF CARE: 1:17  PM Discussed treatment plan with pt at bedside and pt agreed to plan.   Labs Review Labs Reviewed - No data to display  Imaging Review No results found.   EKG Interpretation None      MDM   Final diagnoses:  Strain of trapezius muscle, right, initial encounter   Right trapezius tenderness to palpation. Patient likely has severe muscle spasm. Patient advised to use heat and try vicodin and flexeril for pain. No neuro deficits at this time.   I personally performed  the services described in this documentation, which was scribed in my presence. The recorded information has been reviewed and is accurate.     Emilia Beck, PA-C 09/25/14 1602  Blane Ohara, MD 09/25/14 [id number]

## 2014-09-25 NOTE — Discharge Instructions (Signed)
Take vicodin as needed for pain. Take flexeril as needed for muscle spasm. Apply heat to the affected area. Refer to attached documents for more information.

## 2014-09-25 NOTE — ED Notes (Signed)
Pt states that she has had neck pain for 3 days and states that it was worse when she woke this morning. Pt states that she feels like she slept wrong on it.

## 2015-03-24 ENCOUNTER — Emergency Department (HOSPITAL_COMMUNITY)
Admission: EM | Admit: 2015-03-24 | Discharge: 2015-03-24 | Disposition: A | Discharge status: Home / Self Care | Payer: Medicaid Other | Attending: Emergency Medicine | Admitting: Emergency Medicine

## 2015-03-24 ENCOUNTER — Encounter (HOSPITAL_COMMUNITY): Payer: Self-pay | Admitting: Cardiology

## 2015-03-24 DIAGNOSIS — Z3202 Encounter for pregnancy test, result negative: Secondary | ICD-10-CM | POA: Diagnosis not present

## 2015-03-24 DIAGNOSIS — R110 Nausea: Secondary | ICD-10-CM | POA: Insufficient documentation

## 2015-03-24 DIAGNOSIS — H6091 Unspecified otitis externa, right ear: Secondary | ICD-10-CM | POA: Insufficient documentation

## 2015-03-24 DIAGNOSIS — Z720 Tobacco use: Secondary | ICD-10-CM | POA: Diagnosis not present

## 2015-03-24 DIAGNOSIS — H9203 Otalgia, bilateral: Secondary | ICD-10-CM | POA: Insufficient documentation

## 2015-03-24 LAB — I-STAT BETA HCG BLOOD, ED (MC, WL, AP ONLY): I-stat hCG, quantitative: <5 m[IU]/mL (ref <5)

## 2015-03-24 MED ORDER — NEOMYCIN-POLYMYXIN-HC 3.5-10000-1 OT SUSP: 4.0000 [drp] | Freq: Four times a day (QID) | OTIC | Status: DC

## 2015-03-24 MED ORDER — ONDANSETRON HCL 4 MG PO TABS: 4.0000 mg | ORAL_TABLET | Freq: Three times a day (TID) | ORAL | Status: DC | PRN

## 2015-03-24 NOTE — ED Provider Notes (Signed)
CSN: [id number]     Arrival date & time 03/24/15  1324 History  This chart was scribed for non-physician practitioner, Trixie Dredge, PA-C, working with Elwin Mocha, MD by Marica Otter, ED Scribe. This patient was seen in room TR11C/TR11C and the patient's care was started at 3:49 PM.   Chief Complaint  Patient presents with  . Otalgia  . Nausea   Patient is a 29 y.o. female presenting with ear pain.  Otalgia Associated symptoms: cough   Associated symptoms: no abdominal pain, no diarrhea, no fever, no neck pain, no rash, no sore throat and no vomiting    [facility]  HPI Comments: Ann Clark is a 29 y.o. female who presents to the Emergency Department complaining of bilateral ear pain with associated nausea onset three weeks ago. Pt notes her pain began with the right ear three weeks ago and the left ear began to hurt this past week. The primary symptom is that they feel "blocked."  Pt also complains of associated cough onset 3-4 days ago. Pt reports using zyrtec, benadryl and theraflu at home for her Sx-- while the meds improved her cough, it did not improve her ear pain. Pt denies abd pain, vomiting, diarrhea, vaginal discharge, fever. Pt notes abnormal periods since she started depo. Pt denies she is pregnant, noting that she has been taking home pregnancy tests which are negative-- last pregnancy test completed was one month ago.    History reviewed. No pertinent past medical history. History reviewed. No pertinent past surgical history. History reviewed. No pertinent family history. Social History  Substance Use Topics  . Smoking status: Current Every Day Smoker  . Smokeless tobacco: None  . Alcohol Use: Yes   OB History    No data available     Review of Systems  Constitutional: Negative for fever.  HENT: Positive for ear pain. Negative for sore throat.   Respiratory: Positive for cough. Negative for shortness of breath.   Cardiovascular: Negative for chest pain.   Gastrointestinal: Positive for nausea. Negative for vomiting, abdominal pain and diarrhea.  Genitourinary: Negative for dysuria, urgency, frequency, vaginal bleeding and vaginal discharge.  Musculoskeletal: Negative for neck pain and neck stiffness.  Skin: Negative for rash.  Allergic/Immunologic: Negative for immunocompromised state.  Hematological: Does not bruise/bleed easily.  Psychiatric/Behavioral: Negative for self-injury.   Allergies  Review of patient's allergies indicates no known allergies.  Home Medications   Prior to Admission medications   Medication Sig Start Date End Date Taking? Authorizing Provider  acetaminophen (TYLENOL) 325 MG tablet Take 650 mg by mouth every 6 (six) hours as needed. FOR PAIN     Historical Provider, MD  cyclobenzaprine (FLEXERIL) 10 MG tablet Take 1 tablet (10 mg total) by mouth 2 (two) times daily as needed for muscle spasms. 09/25/14   Emilia Beck, PA-C  HYDROcodone-acetaminophen (NORCO/VICODIN) 5-325 MG per tablet Take 2 tablets by mouth every 4 (four) hours as needed. 09/25/14   Kaitlyn Szekalski, PA-C  ibuprofen (ADVIL,MOTRIN) 200 MG tablet Take 400 mg by mouth every 6 (six) hours as needed. FOR PAIN     Historical Provider, MD  ibuprofen (ADVIL,MOTRIN) 800 MG tablet Take 1 tablet (800 mg total) by mouth 3 (three) times daily. 06/30/13   Elpidio Anis, PA-C   Triage Vitals: BP 109/70 mmHg  Pulse 68  Temp(Src) 98.4 F (36.9 C) (Oral)  Resp 16  Ht 5' (1.524 m)  Wt 154 lb 9.6 oz (70.126 kg)  BMI 30.19 kg/m2  SpO2  100%  LMP 01/13/2015 Physical Exam  Constitutional: She appears well-developed and well-nourished. No distress.  HENT:  Head: Normocephalic and atraumatic.  Right Ear: External ear normal. Tympanic membrane is erythematous.  Left Ear: Tympanic membrane, external ear and ear canal normal.  Mouth/Throat: Oropharynx is clear and moist. No oropharyngeal exudate.  No frontal or maxillary tenderness   Eyes: Conjunctivae are  normal.  Neck: Neck supple.  Cardiovascular: Normal rate and regular rhythm.   Pulmonary/Chest: Effort normal and breath sounds normal. No respiratory distress. She has no wheezes. She has no rales.  Abdominal: Soft. She exhibits no distension. There is no tenderness. There is no rebound and no guarding.  Neurological: She is alert.  Skin: She is not diaphoretic.  Nursing note and vitals reviewed.  ED Course  Procedures (including critical care time) DIAGNOSTIC STUDIES: Oxygen Saturation is 100% on RA, nl by my interpretation.   Results for orders placed or performed during the hospital encounter of 03/24/15  I-Stat Beta hCG blood, ED ([facility], [facility], [facility] only)  Result Value Ref Range   I-stat hCG, quantitative <5.0 <5 mIU/mL   Comment 3           No results found.   COORDINATION OF CARE: 3:56 PM: Discussed treatment plan which includes pregnancy test and meds with pt at bedside; patient verbalizes understanding and agrees with treatment plan.  MDM   Final diagnoses:  Otalgia, bilateral  Otitis external, right  Nausea   Afebrile, nontoxic patient with bilateral ear discomfort x 3 weeks.  Ears overall appear normal, some erythema of right canal.  Possibly painful due to other mild URI symptoms.  Pt with occasional nausea without abdominal pain or other GI/GU symptoms.  Pregnancy test negative.   D/C home with cortisporin otic, zofran, PCP follow up.  Discussed result, findings, treatment, and follow up  with patient.  Pt given return precautions.  Pt verbalizes understanding and agrees with plan.       I personally performed the services described in this documentation, which was scribed in my presence. The recorded information has been reviewed and is accurate.    Trixie Dredge, PA-C 03/24/15 1733  Elwin Mocha, MD 03/25/15 1254

## 2015-03-24 NOTE — Discharge Instructions (Signed)
Read the information below.  Use the prescribed medication as directed.  Please discuss all new medications with your pharmacist.  You may return to the Emergency Department at any time for worsening condition or any new symptoms that concern you.  If you develop fevers, uncontrolled ear pain, bleeding or discharge from your ear, see your doctor or return for a recheck.      Otalgia Otalgia is pain in or around the ear. When the pain is from the ear itself it is called primary otalgia. Pain may also be coming from somewhere else, like the head and neck. This is called secondary otalgia.  CAUSES  Causes of primary otalgia include:  Middle ear infection.  It can also be caused by injury to the ear or infection of the ear canal (swimmer's ear). Swimmer's ear causes pain, swelling and often drainage from the ear canal. Causes of secondary otalgia include:  Sinus infections.  Allergies and colds that cause stuffiness of the nose and tubes that drain the ears (eustachian tubes).  Dental problems like cavities, gum infections or teething.  Sore Throat (tonsillitis and pharyngitis).  Swollen glands in the neck.  Infection of the bone behind the ear (mastoiditis).  TMJ discomfort (problems with the joint between your jaw and your skull).  Other problems such as nerve disorders, circulation problems, heart disease and tumors of the head and neck can also cause symptoms of ear pain. This is rare. DIAGNOSIS  Evaluation, Diagnosis and Testing:  Examination by your medical caregiver is recommended to evaluate and diagnose the cause of otalgia.  Further testing or referral to a specialist may be indicated if the cause of the ear pain is not found and the symptom persists. TREATMENT   Your doctor may prescribe antibiotics if an ear infection is diagnosed.  Pain relievers and topical analgesics may be recommended.  It is important to take all medications as prescribed. HOME CARE INSTRUCTIONS     It may be helpful to sleep with the painful ear in the up position.  A warm compress over the painful ear may provide relief.  A soft diet and avoiding gum may help while ear pain is present. SEEK IMMEDIATE MEDICAL CARE IF:  You develop severe pain, a high fever, repeated vomiting or dehydration.  You develop extreme dizziness, headache, confusion, ringing in the ears (tinnitus) or hearing loss. Document Released: 07/22/2004 Document Revised: 09/06/2011 Document Reviewed: 04/23/2009 [facility] Patient Information 2015 [place], [place]. This information is not intended to replace advice given to you by your health care provider. Make sure you discuss any questions you have with your health care provider.  Otitis Externa Otitis externa is a germ infection in the outer ear. The outer ear is the area from the eardrum to the outside of the ear. Otitis externa is sometimes called "swimmer's ear." HOME CARE  Put drops in the ear as told by your doctor.  Only take medicine as told by your doctor.  If you have diabetes, your doctor may give you more directions. Follow your doctor's directions.  Keep all doctor visits as told. To avoid another infection:  Keep your ear dry. Use the corner of a towel to dry your ear after swimming or bathing.  Avoid scratching or putting things inside your ear.  Avoid swimming in lakes, dirty water, or pools that use a chemical called chlorine poorly.  You may use ear drops after swimming. Combine equal amounts of white vinegar and alcohol in a bottle. Put 3 or  4 drops in each ear. GET HELP IF:   You have a fever.  Your ear is still red, puffy (swollen), or painful after 3 days.  You still have yellowish-white fluid (pus) coming from the ear after 3 days.  Your redness, puffiness, or pain gets worse.  You have a really bad headache.  You have redness, puffiness, pain, or tenderness behind your ear. MAKE SURE YOU:   Understand these  instructions.  Will watch your condition.  Will get help right away if you are not doing well or get worse. Document Released: 12/01/2007 Document Revised: 10/29/2013 Document Reviewed: 07/01/2011 [facility] Patient Information 2015 [place], [place]. This information is not intended to replace advice given to you by your health care provider. Make sure you discuss any questions you have with your health care provider.

## 2015-03-24 NOTE — ED Notes (Signed)
Pt reports bilateral ear pain and nausea over the past couple of days. Denies any vomiting.

## 2015-05-18 ENCOUNTER — Encounter (HOSPITAL_COMMUNITY): Payer: Self-pay | Admitting: Emergency Medicine

## 2015-05-18 ENCOUNTER — Emergency Department (HOSPITAL_COMMUNITY): Payer: Medicaid Other

## 2015-05-18 ENCOUNTER — Emergency Department (HOSPITAL_COMMUNITY)
Admission: EM | Admit: 2015-05-18 | Discharge: 2015-05-18 | Disposition: A | Discharge status: Home / Self Care | Payer: Medicaid Other | Attending: Emergency Medicine | Admitting: Emergency Medicine

## 2015-05-18 DIAGNOSIS — Z79899 Other long term (current) drug therapy: Secondary | ICD-10-CM | POA: Insufficient documentation

## 2015-05-18 DIAGNOSIS — Z791 Long term (current) use of non-steroidal anti-inflammatories (NSAID): Secondary | ICD-10-CM | POA: Diagnosis not present

## 2015-05-18 DIAGNOSIS — K219 Gastro-esophageal reflux disease without esophagitis: Secondary | ICD-10-CM | POA: Diagnosis not present

## 2015-05-18 DIAGNOSIS — F17200 Nicotine dependence, unspecified, uncomplicated: Secondary | ICD-10-CM | POA: Diagnosis not present

## 2015-05-18 DIAGNOSIS — M25511 Pain in right shoulder: Secondary | ICD-10-CM

## 2015-05-18 HISTORY — DX: Pain in unspecified shoulder: M25.519

## 2015-05-18 MED ORDER — METHOCARBAMOL 500 MG PO TABS: 500.0000 mg | ORAL_TABLET | Freq: Two times a day (BID) | ORAL | Status: DC | PRN

## 2015-05-18 MED ORDER — ONDANSETRON 4 MG PO TBDP: 4.0000 mg | ORAL_TABLET | Freq: Three times a day (TID) | ORAL | Status: DC | PRN

## 2015-05-18 MED ORDER — ONDANSETRON 4 MG PO TBDP
4.0000 mg | ORAL_TABLET | Freq: Once | ORAL | Status: AC
  Administered 2015-05-18: 4 mg via ORAL
  Filled 2015-05-18: qty 1

## 2015-05-18 MED ORDER — ACETAMINOPHEN 325 MG PO TABS
650.0000 mg | ORAL_TABLET | Freq: Once | ORAL | Status: AC
  Administered 2015-05-18: 650 mg via ORAL
  Filled 2015-05-18: qty 2

## 2015-05-18 MED ORDER — GI COCKTAIL ~~LOC~~
30.0000 mL | Freq: Once | ORAL | Status: AC
  Administered 2015-05-18: 30 mL via ORAL
  Filled 2015-05-18: qty 30

## 2015-05-18 MED ORDER — OMEPRAZOLE 20 MG PO CPDR: 20.0000 mg | DELAYED_RELEASE_CAPSULE | Freq: Every day | ORAL | Status: DC

## 2015-05-18 MED ORDER — NAPROXEN 250 MG PO TABS: 250.0000 mg | ORAL_TABLET | Freq: Two times a day (BID) | ORAL | Status: DC

## 2015-05-18 NOTE — ED Notes (Addendum)
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  Reports shoulder always hurts but it has been worse since starting new job.

## 2015-05-18 NOTE — ED Notes (Signed)
Patient transported to X-ray 

## 2015-05-18 NOTE — Discharge Instructions (Signed)
Shoulder Pain The shoulder is the joint that connects your arms to your body. The bones that form the shoulder joint include the upper arm bone (humerus), the shoulder blade (scapula), and the collarbone (clavicle). The top of the humerus is shaped like a ball and fits into a rather flat socket on the scapula (glenoid cavity). A combination of muscles and strong, fibrous tissues that connect muscles to bones (tendons) support your shoulder joint and hold the ball in the socket. Small, fluid-filled sacs (bursae) are located in different areas of the joint. They act as cushions between the bones and the overlying soft tissues and help reduce friction between the gliding tendons and the bone as you move your arm. Your shoulder joint allows a wide range of motion in your arm. This range of motion allows you to do things like scratch your back or throw a ball. However, this range of motion also makes your shoulder more prone to pain from overuse and injury. Causes of shoulder pain can originate from both injury and overuse and usually can be grouped in the following four categories:  Redness, swelling, and pain (inflammation) of the tendon (tendinitis) or the bursae (bursitis).  Instability, such as a dislocation of the joint.  Inflammation of the joint (arthritis).  Broken bone (fracture). HOME CARE INSTRUCTIONS   Apply ice to the sore area.  Put ice in a plastic bag.  Place a towel between your skin and the bag.  Leave the ice on for 15-20 minutes, 3-4 times per day for the first 2 days, or as directed by your health care provider.  Stop using cold packs if they do not help with the pain.  If you have a shoulder sling or immobilizer, wear it as long as your caregiver instructs. Only remove it to shower or bathe. Move your arm as little as possible, but keep your hand moving to prevent swelling.  Squeeze a soft ball or foam pad as much as possible to help prevent swelling.  Only take  over-the-counter or prescription medicines for pain, discomfort, or fever as directed by your caregiver. SEEK MEDICAL CARE IF:   Your shoulder pain increases, or new pain develops in your arm, hand, or fingers.  Your hand or fingers become cold and numb.  Your pain is not relieved with medicines. SEEK IMMEDIATE MEDICAL CARE IF:   Your arm, hand, or fingers are numb or tingling.  Your arm, hand, or fingers are significantly swollen or turn white or blue. MAKE SURE YOU:   Understand these instructions.  Will watch your condition.  Will get help right away if you are not doing well or get worse.   This information is not intended to replace advice given to you by your health care provider. Make sure you discuss any questions you have with your health care provider.   Document Released: 03/24/2005 Document Revised: 07/05/2014 Document Reviewed: 10/07/2014 Elsevier Interactive Patient Education 2016 [employer] Inc. Shoulder Range of Motion Exercises Shoulder range of motion (ROM) exercises are designed to keep the shoulder moving freely. They are often recommended for people who have shoulder pain. MOVEMENT EXERCISE When you are able, do this exercise 5-6 days per week, or as told by your health care provider. Work toward doing 2 sets of 10 swings. Pendulum Exercise How To Do This Exercise Lying Down  Lie face-down on a bed with your abdomen close to the side of the bed.  Let your arm hang over the side of the bed.  Relax your shoulder, arm, and hand.  Slowly and gently swing your arm forward and back. Do not use your neck muscles to swing your arm. They should be relaxed. If you are struggling to swing your arm, have someone gently swing it for you. When you do this exercise for the first time, swing your arm at a 15 degree angle for 15 seconds, or swing your arm 10 times. As pain lessens over time, increase the angle of the swing to 30-45 degrees.  Repeat steps 1-4 with the other  arm. How To Do This Exercise While Standing  Stand next to a sturdy chair or table and hold on to it with your hand.  Bend forward at the waist.  Bend your knees slightly.  Relax your other arm and let it hang limp.  Relax the shoulder blade of the arm that is hanging and let it drop.  While keeping your shoulder relaxed, use body motion to swing your arm in small circles. The first time you do this exercise, swing your arm for about 30 seconds or 10 times. When you do it next time, swing your arm for a little longer.  Stand up tall and relax.  Repeat steps 1-7, this time changing the direction of the circles.  Repeat steps 1-8 with the other arm. STRETCHING EXERCISES Do these exercises 3-4 times per day on 5-6 days per week or as told by your health care provider. Work toward holding the stretch for 20 seconds. Stretching Exercise 1  Lift your arm straight out in front of you.  Bend your arm 90 degrees at the elbow (right angle) so your forearm goes across your body and looks like the letter "L."  Use your other arm to gently pull the elbow forward and across your body.  Repeat steps 1-3 with the other arm. Stretching Exercise 2 You will need a towel or rope for this exercise.  Bend one arm behind your back with the palm facing outward.  Hold a towel with your other hand.  Reach the arm that holds the towel above your head, and bend that arm at the elbow. Your wrist should be behind your neck.  Use your free hand to grab the free end of the towel.  With the higher hand, gently pull the towel up behind you.  With the lower hand, pull the towel down behind you.  Repeat steps 1-6 with the other arm. STRENGTHENING EXERCISES Do each of these exercises at four different times of day (sessions) every day or as told by your health care provider. To begin with, repeat each exercise 5 times (repetitions). Work toward doing 3 sets of 12 repetitions or as told by your health care  provider. Strengthening Exercise 1 You will need a light weight for this activity. As you grow stronger, you may use a heavier weight.  Standing with a weight in your hand, lift your arm straight out to the side until it is at the same height as your shoulder.  Bend your arm at 90 degrees so that your fingers are pointing to the ceiling.  Slowly raise your hand until your arm is straight up in the air.  Repeat steps 1-3 with the other arm. Strengthening Exercise 2 You will need a light weight for this activity. As you grow stronger, you may use a heavier weight.  Standing with a weight in your hand, gradually move your straight arm in an arc, starting at your side, then out in front  of you, then straight up over your head.  Gradually move your other arm in an arc, starting at your side, then out in front of you, then straight up over your head.  Repeat steps 1-2 with the other arm. Strengthening Exercise 3 You will need an elastic band for this activity. As you grow stronger, gradually increase the size of the bands or increase the number of bands that you use at one time.  While standing, hold an elastic band in one hand and raise that arm up in the air.  With your other hand, pull down the band until that hand is by your side.  Repeat steps 1-2 with the other arm.   This information is not intended to replace advice given to you by your health care provider. Make sure you discuss any questions you have with your health care provider.   Document Released: 03/13/2003 Document Revised: 10/29/2014 Document Reviewed: 06/10/2014 Elsevier Interactive Patient Education 2016 [employer]. Food Choices for Gastroesophageal Reflux Disease, Adult When you have gastroesophageal reflux disease (GERD), the foods you eat and your eating habits are very important. Choosing the right foods can help ease the discomfort of GERD. WHAT GENERAL GUIDELINES DO I NEED TO FOLLOW?  Choose fruits,  vegetables, whole grains, low-fat dairy products, and low-fat meat, fish, and poultry.  Limit fats such as oils, salad dressings, butter, nuts, and avocado.  Keep a food diary to identify foods that cause symptoms.  Avoid foods that cause reflux. These may be different for different people.  Eat frequent small meals instead of three large meals each day.  Eat your meals slowly, in a relaxed setting.  Limit fried foods.  Cook foods using methods other than frying.  Avoid drinking alcohol.  Avoid drinking large amounts of liquids with your meals.  Avoid bending over or lying down until 2-3 hours after eating. WHAT FOODS ARE NOT RECOMMENDED? The following are some foods and drinks that may worsen your symptoms: Vegetables Tomatoes. Tomato juice. Tomato and spaghetti sauce. Chili peppers. Onion and garlic. Horseradish. Fruits Oranges, grapefruit, and lemon (fruit and juice). Meats High-fat meats, fish, and poultry. This includes hot dogs, ribs, ham, sausage, salami, and bacon. Dairy Whole milk and chocolate milk. Sour cream. Cream. Butter. Ice cream. Cream cheese.  Beverages Coffee and tea, with or without caffeine. Carbonated beverages or energy drinks. Condiments Hot sauce. Barbecue sauce.  Sweets/Desserts Chocolate and cocoa. Donuts. Peppermint and spearmint. Fats and Oils High-fat foods, including [place] fries and potato chips. Other Vinegar. Strong spices, such as black pepper, white pepper, red pepper, cayenne, curry powder, cloves, ginger, and chili powder. The items listed above may not be a complete list of foods and beverages to avoid. Contact your dietitian for more information.   This information is not intended to replace advice given to you by your health care provider. Make sure you discuss any questions you have with your health care provider.   Document Released: 06/14/2005 Document Revised: 07/05/2014 Document Reviewed: 04/18/2013 Elsevier Interactive  Patient Education 2016 Elsevier Inc. Gastroesophageal Reflux Disease, Adult Normally, food travels down the esophagus and stays in the stomach to be digested. However, when a person has gastroesophageal reflux disease (GERD), food and stomach acid move back up into the esophagus. When this happens, the esophagus becomes sore and inflamed. Over time, GERD can create small holes (ulcers) in the lining of the esophagus.  CAUSES This condition is caused by a problem with the muscle between the esophagus and the stomach (  lower esophageal sphincter, or LES). Normally, the LES muscle closes after food passes through the esophagus to the stomach. When the LES is weakened or abnormal, it does not close properly, and that allows food and stomach acid to go back up into the esophagus. The LES can be weakened by certain dietary substances, medicines, and medical conditions, including:  Tobacco use.  Pregnancy.  Having a hiatal hernia.  Heavy alcohol use.  Certain foods and beverages, such as coffee, chocolate, onions, and peppermint. RISK FACTORS This condition is more likely to develop in:  People who have an increased body weight.  People who have connective tissue disorders.  People who use NSAID medicines. SYMPTOMS Symptoms of this condition include:  Heartburn.  Difficult or painful swallowing.  The feeling of having a lump in the throat.  Abitter taste in the mouth.  Bad breath.  Having a large amount of saliva.  Having an upset or bloated stomach.  Belching.  Chest pain.  Shortness of breath or wheezing.  Ongoing (chronic) cough or a night-time cough.  Wearing away of tooth enamel.  Weight loss. Different conditions can cause chest pain. Make sure to see your health care provider if you experience chest pain. DIAGNOSIS Your health care provider will take a medical history and perform a physical exam. To determine if you have mild or severe GERD, your health care  provider may also monitor how you respond to treatment. You may also have other tests, including:  An endoscopy toexamine your stomach and esophagus with a small camera.  A test thatmeasures the acidity level in your esophagus.  A test thatmeasures how much pressure is on your esophagus.  A barium swallow or modified barium swallow to show the shape, size, and functioning of your esophagus. TREATMENT The goal of treatment is to help relieve your symptoms and to prevent complications. Treatment for this condition may vary depending on how severe your symptoms are. Your health care provider may recommend:  Changes to your diet.  Medicine.  Surgery. HOME CARE INSTRUCTIONS Diet  Follow a diet as recommended by your health care provider. This may involve avoiding foods and drinks such as:  Coffee and tea (with or without caffeine).  Drinks that containalcohol.  Energy drinks and sports drinks.  Carbonated drinks or sodas.  Chocolate and cocoa.  Peppermint and mint flavorings.  Garlic and onions.  Horseradish.  Spicy and acidic foods, including peppers, chili powder, curry powder, vinegar, hot sauces, and barbecue sauce.  Citrus fruit juices and citrus fruits, such as oranges, lemons, and limes.  Tomato-based foods, such as red sauce, chili, salsa, and pizza with red sauce.  Fried and fatty foods, such as donuts, french fries, potato chips, and high-fat dressings.  High-fat meats, such as hot dogs and fatty cuts of red and white meats, such as rib eye steak, sausage, ham, and bacon.  High-fat dairy items, such as whole milk, butter, and cream cheese.  Eat small, frequent meals instead of large meals.  Avoid drinking large amounts of liquid with your meals.  Avoid eating meals during the 2-3 hours before bedtime.  Avoid lying down right after you eat.  Do not exercise right after you eat. General Instructions  Pay attention to any changes in your  symptoms.  Take over-the-counter and prescription medicines only as told by your health care provider. Do not take aspirin, ibuprofen, or other NSAIDs unless your health care provider told you to do so.  Do not use any tobacco products,  including cigarettes, chewing tobacco, and e-cigarettes. If you need help quitting, ask your health care provider.  Wear loose-fitting clothing. Do not wear anything tight around your waist that causes pressure on your abdomen.  Raise (elevate) the head of your bed 6 inches (15cm).  Try to reduce your stress, such as with yoga or meditation. If you need help reducing stress, ask your health care provider.  If you are overweight, reduce your weight to an amount that is healthy for you. Ask your health care provider for guidance about a safe weight loss goal.  Keep all follow-up visits as told by your health care provider. This is important. SEEK MEDICAL CARE IF:  You have new symptoms.  You have unexplained weight loss.  You have difficulty swallowing, or it hurts to swallow.  You have wheezing or a persistent cough.  Your symptoms do not improve with treatment.  You have a hoarse voice. SEEK IMMEDIATE MEDICAL CARE IF:  You have pain in your arms, neck, jaw, teeth, or back.  You feel sweaty, dizzy, or light-headed.  You have chest pain or shortness of breath.  You vomit and your vomit looks like blood or coffee grounds.  You faint.  Your stool is bloody or black.  You cannot swallow, drink, or eat.   This information is not intended to replace advice given to you by your health care provider. Make sure you discuss any questions you have with your health care provider.   Document Released: 03/24/2005 Document Revised: 03/05/2015 Document Reviewed: 10/09/2014 Elsevier Interactive Patient Education [employer] Elsevier Inc.

## 2015-05-18 NOTE — ED Provider Notes (Signed)
CSN: [id number]     Arrival date & time 05/18/15  1859 History   First MD Initiated Contact with Patient 05/18/15 1925     Chief Complaint  Patient presents with  . Shoulder Pain   Ann Clark is a 29 y.o. female with a history of shoulder pain who presents to the ED complaining of worsening right shoulder pain over the past month. The patient complains of 8 out of 10 pain at her right shoulder which is worse with movement. Patient reports she has a history of shoulder dislocation when she was younger. She denies recent dislocations. She denies any recent trauma to her right shoulder. She reports she has been taking 5 ibuprofen, 2 aleve and 3 tylenol at at time at night to try and help with her pain. She reports this makes her stomach upset and she has been having lots of acid reflux symptoms with associated nausea. 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 The patient denies fevers, chills, abdominal pain, vomiting, diarrhea, numbness, tingling, weakness, or rashes.  (Consider location/radiation/quality/duration/timing/severity/associated sxs/prior Treatment) HPI  Past Medical History  Diagnosis Date  . Shoulder pain    History reviewed. No pertinent past surgical history. No family history on file. Social History  Substance Use Topics  . Smoking status: Current Every Day Smoker  . Smokeless tobacco: None  . Alcohol Use: Yes   OB History    No data available     Review of Systems  Constitutional: Negative for fever and chills.  Gastrointestinal: Positive for nausea. Negative for vomiting, abdominal pain and diarrhea.  Musculoskeletal: Positive for arthralgias. Negative for joint swelling, neck pain and neck stiffness.  Skin: Negative for rash and wound.  Neurological: Negative for weakness and numbness.      Allergies  Review of patient's allergies indicates no known allergies.  Home Medications   Prior  to Admission medications   Medication Sig Start Date End Date Taking? Authorizing Provider  acetaminophen (TYLENOL) 325 MG tablet Take 650 mg by mouth every 6 (six) hours as needed. FOR PAIN     Historical Provider, MD  cyclobenzaprine (FLEXERIL) 10 MG tablet Take 1 tablet (10 mg total) by mouth 2 (two) times daily as needed for muscle spasms. 09/25/14   Emilia Beck, PA-C  HYDROcodone-acetaminophen (NORCO/VICODIN) 5-325 MG per tablet Take 2 tablets by mouth every 4 (four) hours as needed. 09/25/14   Kaitlyn Szekalski, PA-C  ibuprofen (ADVIL,MOTRIN) 200 MG tablet Take 400 mg by mouth every 6 (six) hours as needed. FOR PAIN     Historical Provider, MD  ibuprofen (ADVIL,MOTRIN) 800 MG tablet Take 1 tablet (800 mg total) by mouth 3 (three) times daily. 06/30/13   Elpidio Anis, PA-C  methocarbamol (ROBAXIN) 500 MG tablet Take 1 tablet (500 mg total) by mouth 2 (two) times daily as needed for muscle spasms. 05/18/15   Everlene Farrier, PA-C  naproxen (NAPROSYN) 250 MG tablet Take 1 tablet (250 mg total) by mouth 2 (two) times daily with a meal. 05/18/15   Everlene Farrier, PA-C  neomycin-polymyxin-hydrocortisone (CORTISPORIN) 3.5-10000-1 otic suspension Place 4 drops into the right ear 4 (four) times daily. X 7 days 03/24/15   Trixie Dredge, PA-C  omeprazole (PRILOSEC) 20 MG capsule Take 1 capsule (20 mg total) by mouth daily. 05/18/15   Everlene Farrier, PA-C  ondansetron (ZOFRAN ODT) 4 MG disintegrating tablet Take 1 tablet (4 mg total) by mouth every 8 (eight) hours as needed for  nausea or vomiting. 05/18/15   Everlene FarrierWilliam Isam Unrein, PA-C  ondansetron (ZOFRAN) 4 MG tablet Take 1 tablet (4 mg total) by mouth every 8 (eight) hours as needed for nausea or vomiting. 03/24/15   Trixie DredgeEmily West, PA-C   BP 119/85 mmHg  Pulse 74  Temp(Src) 98.4 F (36.9 C) (Oral)  Resp 16  Ht 5' (1.524 m)  Wt 149 lb (67.586 kg)  BMI 29.10 kg/m2  SpO2 99%  LMP 03/16/2015 (Approximate) Physical Exam  Constitutional: She appears  well-developed and well-nourished. No distress.  HENT:  Head: Normocephalic and atraumatic.  Eyes: Right eye exhibits no discharge. Left eye exhibits no discharge.  Cardiovascular: Normal rate, regular rhythm and intact distal pulses.   Bilateral radial pulses are intact. Good capillary refill to right distal fingertips.  Pulmonary/Chest: Effort normal. No respiratory distress.  Abdominal: Soft. There is no tenderness. There is no guarding.  Musculoskeletal: Normal range of motion. She exhibits no edema or tenderness.  Patient has good range of motion of her right shoulder. Patient has good strength of her right upper extremity. No bony point tenderness of the shoulder. Good and equal grip strength bilaterally. No clavicle tenderness. No joint edema or deformity noted.  Neurological: She is alert. Coordination normal.  The patient's sensation is intact in her bilateral upper extremities.  Skin: Skin is warm and dry. No rash noted. She is not diaphoretic. No erythema. No pallor.  Psychiatric: She has a normal mood and affect. Her behavior is normal.  Nursing note and vitals reviewed.   ED Course  Procedures (including critical care time) Labs Review Labs Reviewed - No data to display  Imaging Review Dg Shoulder Right  05/18/2015  CLINICAL DATA:  Posterior RIGHT shoulder pain for 2 weeks. History of prior dislocation. EXAM: RIGHT SHOULDER - 2+ VIEW COMPARISON:  None. FINDINGS: There is no evidence of fracture or dislocation. There is no evidence of arthropathy or other focal bone abnormality. Soft tissues are unremarkable. IMPRESSION: Negative. Electronically Signed   By: Elsie StainJohn T Curnes M.D.   On: 05/18/2015 20:09   I have personally reviewed and evaluated these images as part of my medical decision-making.   EKG Interpretation None      Filed Vitals:   05/18/15 1909  BP: 119/85  Pulse: 74  Temp: 98.4 F (36.9 C)  TempSrc: Oral  Resp: 16  Height: 5' (1.524 m)  Weight: 149 lb  (67.586 kg)  SpO2: 99%     MDM   Meds given in ED:  Medications  ondansetron (ZOFRAN-ODT) disintegrating tablet 4 mg (4 mg Oral Given 05/18/15 1940)  gi cocktail (Maalox,Lidocaine,Donnatal) (30 mLs Oral Given 05/18/15 1940)  acetaminophen (TYLENOL) tablet 650 mg (650 mg Oral Given 05/18/15 1940)    Discharge Medication List as of 05/18/2015  8:19 PM    START taking these medications   Details  methocarbamol (ROBAXIN) 500 MG tablet Take 1 tablet (500 mg total) by mouth 2 (two) times daily as needed for muscle spasms., Starting 05/18/2015, Until Discontinued, Print    naproxen (NAPROSYN) 250 MG tablet Take 1 tablet (250 mg total) by mouth 2 (two) times daily with a meal., Starting 05/18/2015, Until Discontinued, Print    omeprazole (PRILOSEC) 20 MG capsule Take 1 capsule (20 mg total) by mouth daily., Starting 05/18/2015, Until Discontinued, Print    ondansetron (ZOFRAN ODT) 4 MG disintegrating tablet Take 1 tablet (4 mg total) by mouth every 8 (eight) hours as needed for nausea or vomiting., Starting 05/18/2015, Until Discontinued, Print  Final diagnoses:  Right shoulder pain  Gastroesophageal reflux disease, esophagitis presence not specified   This is a 29 y.o. female with a history of shoulder pain who presents to the ED complaining of worsening right shoulder pain over the past month. The patient complains of 8 out of 10 pain at her right shoulder which is worse with movement. Patient reports she has a history of shoulder dislocation when she was younger. She denies recent dislocations. She denies any recent trauma to her right shoulder. She reports all the medication she is taking at night is upsetting her stomach. She reports acid reflux symptoms. On exam the patient is afebrile nontoxic appearing. She has good range of motion of her right shoulder. No right shoulder deformity. No right shoulder bony point tenderness. 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 She reports her pain is improved with rest. Patient is requesting x-ray. Left shoulder x-ray is unremarkable. Patient provided with GI cocktail to help settle her stomach. At reevaluation after GI cocktail she reports her stomach feels much better. I advised her to no longer take any over-the-counter pain medicines and only take medicines are prescribed to her. We'll start the patient on Prilosec for her acid reflux. I advised to only take an NSAID with food and to stop taking if her stomach is upset. I also provided the patient with follow-up with orthopedic surgeon Dr. Roda Shutters. I also encouraged follow up by PCP. I advised the patient to follow-up with their primary care provider this week. I advised the patient to return to the emergency department with new or worsening symptoms or new concerns. The patient verbalized understanding and agreement with plan.       Everlene Farrier, PA-C 05/18/15 2047  Doug Sou, MD 05/18/15 2326

## 2015-07-09 ENCOUNTER — Ambulatory Visit: Payer: Medicaid Other

## 2015-07-16 ENCOUNTER — Encounter: Payer: Self-pay | Admitting: Family Medicine

## 2015-07-16 ENCOUNTER — Ambulatory Visit: Payer: Medicaid Other | Attending: Family Medicine | Admitting: Family Medicine

## 2015-07-16 ENCOUNTER — Encounter (HOSPITAL_BASED_OUTPATIENT_CLINIC_OR_DEPARTMENT_OTHER): Payer: Medicaid Other | Admitting: Clinical

## 2015-07-16 VITALS — BP 111/74 | HR 62 | Temp 97.8°F | Resp 13 | Ht 60.0 in | Wt 152.0 lb

## 2015-07-16 DIAGNOSIS — R110 Nausea: Secondary | ICD-10-CM | POA: Diagnosis not present

## 2015-07-16 DIAGNOSIS — M2550 Pain in unspecified joint: Secondary | ICD-10-CM | POA: Diagnosis not present

## 2015-07-16 DIAGNOSIS — F319 Bipolar disorder, unspecified: Secondary | ICD-10-CM | POA: Diagnosis present

## 2015-07-16 DIAGNOSIS — Z131 Encounter for screening for diabetes mellitus: Secondary | ICD-10-CM | POA: Diagnosis not present

## 2015-07-16 DIAGNOSIS — N926 Irregular menstruation, unspecified: Secondary | ICD-10-CM

## 2015-07-16 LAB — POCT GLYCOSYLATED HEMOGLOBIN (HGB A1C): HEMOGLOBIN A1C: 5.2

## 2015-07-16 LAB — POCT URINE PREGNANCY: PREG TEST UR: NEGATIVE

## 2015-07-16 LAB — RHEUMATOID FACTOR: Rhuematoid fact SerPl-aCnc: <10 IU/mL (ref <=14)

## 2015-07-16 MED ORDER — PANTOPRAZOLE SODIUM 40 MG PO TBEC: 40.0000 mg | DELAYED_RELEASE_TABLET | Freq: Every day | ORAL | Status: DC

## 2015-07-16 NOTE — Progress Notes (Signed)
ASSESSMENT: Pt currently experiencing untreated bipolar I disorder, and needs to establish care with psychiatry and f/u with PCP and [facility]; would benefit from therapy to cope with symptoms of bipolar disorder.  Stage of Change: contemplative  PLAN: 1. F/U with behavioral health consultant in 2 weeks, or earlier, as needed 2. Psychiatric Medications: none. 3. Behavioral recommendation(s):   -Go to [facility] of the [place] walk in clinic on 07-17-15   SUBJECTIVE: Pt. referred by Dr Venetia Night for bipolar:  Pt. reports the following symptoms/concerns: Pt states that her primary concern is feeling so anxious that she cannot sleep, that nothing has helped; she is taking up to 3 Tylenol pm tablets/night and still only sleeping 2 hours at a time; was being treated by [facility], but did not like it there and does not want to go back.  Duration of problem: undetermined number of years Severity: severe  OBJECTIVE: Orientation & Cognition: Oriented x3. Thought processes normal and appropriate to situation. Mood: appropriate. Affect: appropriate Appearance: appropriate Risk of harm to self or others: no known risk of harm to self or others (noted"never want to hurt myself or anyone else" on PHQ9 Substance use: tobacco Assessments administered: PHQ9: 23/ GAD7: 21  Diagnosis: Bipolar I disorder CPT Code: F31.9 -------------------------------------------- Other(s) present in the room: none  Time spent with patient in exam room: 16 minutes

## 2015-07-16 NOTE — Patient Instructions (Signed)

## 2015-07-16 NOTE — Patient Instructions (Addendum)
PLAN:  1. F/U with behavioral health consultant in 2 weeks, or earlier, as needed 2. Psychiatric medications: none 3. Behavioral recommendation(s):  -Go to [facility] of the [facility] walk-in clinic on 07-17-15 -Call Asher Muir at [phone] to let [facility] know that [facility] appointment was complete

## 2015-07-16 NOTE — Progress Notes (Signed)
Patient here to establish care She has not had a menstrual cycle since October 2016 She was taking depo shot but last shot was in April 2016 She has been diagnosed with Bipolar and was receiving treatment at [facility] but was unhappy with care and has not been seen recently States she does smoke marijuana and used to drink 1 pint of liquor a day and 6 pack of beer -she reports last drink was on [place] Years Eve

## 2015-07-16 NOTE — Progress Notes (Signed)
Subjective:  Patient ID: Ann Clark, female    DOB: October 14, 1985  Age: 30 y.o. MRN: [id number]  CC: Establish Care   HPI Ann Clark is a 30 year old female with a history of bipolar disorder who comes into the clinic to establish care. She was under the assumption she would be having a physical today but was not scheduled for one. She complains of nausea which is unrelated to meals are not specific to any time of the day and denies any reflux or abdominal pain. For her bipolar she was treated with Seroquel in the past which did not help and so she stopped it. Complains of multiple joint aches for several years in her knees and shoulders and has been on NSAIDs with no relief. States her shoulders get dislocated and she is able to reposition it back on her own. She is concerned she has not had a period been 03/2015 when she stopped her Depo-Provera but then before being on Depo-Provera she had been on Nexplanon.  Outpatient Prescriptions Prior to Visit  Medication Sig Dispense Refill  . ibuprofen (ADVIL,MOTRIN) 200 MG tablet Take 400 mg by mouth every 6 (six) hours as needed. FOR PAIN     . naproxen (NAPROSYN) 250 MG tablet Take 1 tablet (250 mg total) by mouth 2 (two) times daily with a meal. 30 tablet 0  . acetaminophen (TYLENOL) 325 MG tablet Take 650 mg by mouth every 6 (six) hours as needed. Reported on 07/16/2015    . ondansetron (ZOFRAN) 4 MG tablet Take 1 tablet (4 mg total) by mouth every 8 (eight) hours as needed for nausea or vomiting. 12 tablet 0  . cyclobenzaprine (FLEXERIL) 10 MG tablet Take 1 tablet (10 mg total) by mouth 2 (two) times daily as needed for muscle spasms. (Patient not taking: Reported on 07/16/2015) 20 tablet 0  . HYDROcodone-acetaminophen (NORCO/VICODIN) 5-325 MG per tablet Take 2 tablets by mouth every 4 (four) hours as needed. 6 tablet 0  . ibuprofen (ADVIL,MOTRIN) 800 MG tablet Take 1 tablet (800 mg total) by mouth 3 (three) times daily. 21 tablet 0  .  methocarbamol (ROBAXIN) 500 MG tablet Take 1 tablet (500 mg total) by mouth 2 (two) times daily as needed for muscle spasms. 20 tablet 0  . neomycin-polymyxin-hydrocortisone (CORTISPORIN) 3.5-10000-1 otic suspension Place 4 drops into the right ear 4 (four) times daily. X 7 days 10 mL 0  . omeprazole (PRILOSEC) 20 MG capsule Take 1 capsule (20 mg total) by mouth daily. 30 capsule 1  . ondansetron (ZOFRAN ODT) 4 MG disintegrating tablet Take 1 tablet (4 mg total) by mouth every 8 (eight) hours as needed for nausea or vomiting. 10 tablet 0   No facility-administered medications prior to visit.    ROS Review of Systems  Constitutional: Negative for activity change, appetite change and fatigue.  HENT: Negative for congestion, sinus pressure and sore throat.   Eyes: Negative for visual disturbance.  Respiratory: Negative for cough, chest tightness, shortness of breath and wheezing.   Cardiovascular: Negative for chest pain and palpitations.  Gastrointestinal: Positive for nausea. Negative for abdominal pain, constipation and abdominal distention.  Endocrine: Negative for polydipsia.  Genitourinary: Negative for dysuria and frequency.  Musculoskeletal: Positive for arthralgias. Negative for back pain.  Skin: Negative for rash.  Neurological: Negative for tremors, light-headedness and numbness.  Hematological: Does not bruise/bleed easily.  Psychiatric/Behavioral: Negative for behavioral problems and agitation.    Objective:  BP 111/74 mmHg  Pulse 62  Temp(Src)  97.8 F (36.6 C)  Resp 13  Ht 5' (1.524 m)  Wt 152 lb (68.947 kg)  BMI 29.69 kg/m2  SpO2 99%  LMP 03/29/2015  BP/Weight 07/16/2015 05/18/2015 03/24/2015  Systolic BP 111 119 109  Diastolic BP 74 85 70  Wt. (Lbs) 152 149 154.6  BMI 29.69 29.1 30.19      Physical Exam  Constitutional: She is oriented to person, place, and time. She appears well-developed and well-nourished.  Cardiovascular: Normal rate, normal heart sounds  and intact distal pulses.   No murmur heard. Pulmonary/Chest: Effort normal and breath sounds normal. She has no wheezes. She has no rales. She exhibits no tenderness.  Abdominal: Soft. Bowel sounds are normal. She exhibits no distension and no mass. There is no tenderness.  Musculoskeletal: Normal range of motion.  Neurological: She is alert and oriented to person, place, and time.     Assessment & Plan:   1. Diabetes mellitus screening A1c is 5.2 - HgB A1c  2. Missed period I have explained to her that this is likely due to the DT the fact of being on Nexplanon and then subsequently on Depo-Provera - POCT urine pregnancy  3. Arthralgia Unknown etiology - ANA - Rheumatoid factor - Sedimentation rate - Cyclic Citrul Peptide Antibody, IGG  4. Bipolar I disorder (HCC) Previously on Seroquel which she did not like and is currently not under the care of of any psychiatrists.  5. Nausea Will treat presumptively for GERD and reassess at next viist No orders of the defined types were placed in this encounter.    Follow-up: Return in about 2 weeks (around 07/30/2015) for Complete physical exam.   Jaclyn Shaggy MD

## 2015-07-17 LAB — SEDIMENTATION RATE: Sed Rate: 1 mm/hr (ref 0–20)

## 2015-07-17 LAB — CYCLIC CITRUL PEPTIDE ANTIBODY, IGG

## 2015-07-17 LAB — ANA: ANA: NEGATIVE

## 2015-07-23 ENCOUNTER — Telehealth: Payer: Self-pay

## 2015-07-23 NOTE — Telephone Encounter (Signed)
-----   Message from Jaclyn Shaggy, MD sent at 07/18/2015  2:14 PM EST ----- Please inform the patient that labs are normal. Thank you.

## 2015-07-23 NOTE — Telephone Encounter (Signed)
CMA called patient, patient didn't answer. A message was left for patient to return my call asap.

## 2015-07-29 ENCOUNTER — Telehealth: Payer: Self-pay

## 2015-07-29 NOTE — Telephone Encounter (Signed)
CMA called patient, patient did not answer. Left message for patient to return my call. This is the 3rd attempt to reach patient. Letter will be sent to patients address on file.

## 2015-07-29 NOTE — Telephone Encounter (Signed)
CMA called Patient, patient did not answer. This is the 2nd attempt reaching patient. A message was left for patient to return my call.

## 2015-07-30 ENCOUNTER — Encounter: Payer: Self-pay | Admitting: Family Medicine

## 2015-07-30 ENCOUNTER — Other Ambulatory Visit (HOSPITAL_COMMUNITY)
Admission: RE | Admit: 2015-07-30 | Discharge: 2015-07-30 | Disposition: A | Discharge status: Home / Self Care | Payer: Medicaid Other | Source: Ambulatory Visit | Attending: Family Medicine | Admitting: Family Medicine

## 2015-07-30 ENCOUNTER — Ambulatory Visit: Payer: Medicaid Other | Attending: Family Medicine | Admitting: Family Medicine

## 2015-07-30 ENCOUNTER — Other Ambulatory Visit: Payer: Self-pay

## 2015-07-30 VITALS — BP 98/59 | HR 71 | Temp 98.3°F | Resp 16 | Ht 60.0 in | Wt 150.0 lb

## 2015-07-30 DIAGNOSIS — N760 Acute vaginitis: Secondary | ICD-10-CM | POA: Diagnosis present

## 2015-07-30 DIAGNOSIS — M791 Myalgia, unspecified site: Secondary | ICD-10-CM | POA: Insufficient documentation

## 2015-07-30 DIAGNOSIS — Z0000 Encounter for general adult medical examination without abnormal findings: Secondary | ICD-10-CM

## 2015-07-30 DIAGNOSIS — B3731 Acute candidiasis of vulva and vagina: Secondary | ICD-10-CM

## 2015-07-30 DIAGNOSIS — Z01419 Encounter for gynecological examination (general) (routine) without abnormal findings: Secondary | ICD-10-CM | POA: Insufficient documentation

## 2015-07-30 DIAGNOSIS — N912 Amenorrhea, unspecified: Secondary | ICD-10-CM | POA: Diagnosis not present

## 2015-07-30 DIAGNOSIS — Z79899 Other long term (current) drug therapy: Secondary | ICD-10-CM | POA: Insufficient documentation

## 2015-07-30 DIAGNOSIS — K5909 Other constipation: Secondary | ICD-10-CM

## 2015-07-30 DIAGNOSIS — Z13220 Encounter for screening for lipoid disorders: Secondary | ICD-10-CM

## 2015-07-30 DIAGNOSIS — B373 Candidiasis of vulva and vagina: Secondary | ICD-10-CM | POA: Diagnosis not present

## 2015-07-30 DIAGNOSIS — Z1151 Encounter for screening for human papillomavirus (HPV): Secondary | ICD-10-CM | POA: Insufficient documentation

## 2015-07-30 DIAGNOSIS — K589 Irritable bowel syndrome without diarrhea: Secondary | ICD-10-CM | POA: Diagnosis not present

## 2015-07-30 DIAGNOSIS — K5900 Constipation, unspecified: Secondary | ICD-10-CM | POA: Insufficient documentation

## 2015-07-30 DIAGNOSIS — N911 Secondary amenorrhea: Secondary | ICD-10-CM | POA: Diagnosis not present

## 2015-07-30 DIAGNOSIS — Z113 Encounter for screening for infections with a predominantly sexual mode of transmission: Secondary | ICD-10-CM | POA: Diagnosis present

## 2015-07-30 DIAGNOSIS — M609 Myositis, unspecified: Secondary | ICD-10-CM | POA: Diagnosis not present

## 2015-07-30 DIAGNOSIS — K219 Gastro-esophageal reflux disease without esophagitis: Secondary | ICD-10-CM | POA: Diagnosis not present

## 2015-07-30 DIAGNOSIS — Z1159 Encounter for screening for other viral diseases: Secondary | ICD-10-CM

## 2015-07-30 DIAGNOSIS — Z124 Encounter for screening for malignant neoplasm of cervix: Secondary | ICD-10-CM

## 2015-07-30 DIAGNOSIS — IMO0001 Reserved for inherently not codable concepts without codable children: Secondary | ICD-10-CM

## 2015-07-30 DIAGNOSIS — Z23 Encounter for immunization: Secondary | ICD-10-CM

## 2015-07-30 LAB — COMPLETE METABOLIC PANEL WITH GFR
ALBUMIN: 4.1 g/dL (ref 3.6–5.1)
ALK PHOS: 39 U/L (ref 33–115)
ALT: 7 U/L (ref 6–29)
AST: 15 U/L (ref 10–30)
BILIRUBIN TOTAL: 0.5 mg/dL (ref 0.2–1.2)
BUN: 10 mg/dL (ref 7–25)
CO2: 21 mmol/L (ref 20–31)
CREATININE: 0.81 mg/dL (ref 0.50–1.10)
Calcium: 8.8 mg/dL (ref 8.6–10.2)
Chloride: 106 mmol/L (ref 98–110)
GLUCOSE: 79 mg/dL (ref 65–99)
Potassium: 4.5 mmol/L (ref 3.5–5.3)
Sodium: 136 mmol/L (ref 135–146)
TOTAL PROTEIN: 6.7 g/dL (ref 6.1–8.1)

## 2015-07-30 LAB — LIPID PANEL
Cholesterol: 118 mg/dL — ABNORMAL LOW (ref 125–200)
HDL: 40 mg/dL — AB (ref >=46)
LDL CALC: 66 mg/dL (ref <130)
TRIGLYCERIDES: 59 mg/dL (ref <150)
Total CHOL/HDL Ratio: 3 Ratio (ref <=5.0)
VLDL: 12 mg/dL (ref <30)

## 2015-07-30 MED ORDER — FLUCONAZOLE 150 MG PO TABS
150.0000 mg | ORAL_TABLET | Freq: Once | ORAL | Status: DC
Start: 2015-07-30 — End: 2016-04-07

## 2015-07-30 MED ORDER — METHOCARBAMOL 500 MG PO TABS: 500.0000 mg | ORAL_TABLET | Freq: Three times a day (TID) | ORAL | Status: DC | PRN

## 2015-07-30 MED ORDER — POLYETHYLENE GLYCOL 3350 17 GM/SCOOP PO POWD: 1.0000 | Freq: Once | ORAL | Status: DC

## 2015-07-30 MED ORDER — OMEPRAZOLE 40 MG PO CPDR: 40.0000 mg | DELAYED_RELEASE_CAPSULE | Freq: Every day | ORAL | Status: DC

## 2015-07-30 NOTE — Progress Notes (Signed)
Patient's here for complete physical.  Patient has no further concerns

## 2015-07-30 NOTE — Progress Notes (Signed)
Subjective:  Patient ID: Ann Clark, female    DOB: 04-07-1986  Age: 30 y.o. MRN: [id number]  CC: Annual Exam   HPI Ann Clark presents for a complete physical exam. She complains of constipation which comes from a previous history of IBS and is requesting MiraLAX. She would also like to receive omeprazole which helps her reflux symptoms. She continues to have myalgias for which she takes ibuprofen with minimal relief and previous blood work was negative for autoimmune disease  Outpatient Prescriptions Prior to Visit  Medication Sig Dispense Refill  . acetaminophen (TYLENOL) 325 MG tablet Take 650 mg by mouth every 6 (six) hours as needed. Reported on 07/16/2015    . diphenhydramine-acetaminophen (TYLENOL PM) 25-500 MG TABS tablet Take 1 tablet by mouth at bedtime as needed.    Marland Kitchen ibuprofen (ADVIL,MOTRIN) 200 MG tablet Take 400 mg by mouth every 6 (six) hours as needed. FOR PAIN     . naproxen (NAPROSYN) 250 MG tablet Take 1 tablet (250 mg total) by mouth 2 (two) times daily with a meal. 30 tablet 0  . pantoprazole (PROTONIX) 40 MG tablet Take 1 tablet (40 mg total) by mouth daily. 30 tablet 3  . ondansetron (ZOFRAN) 4 MG tablet Take 1 tablet (4 mg total) by mouth every 8 (eight) hours as needed for nausea or vomiting. (Patient not taking: Reported on 07/30/2015) 12 tablet 0   No facility-administered medications prior to visit.    ROS Review of Systems  Constitutional: Negative for activity change, appetite change and fatigue.  HENT: Negative for congestion, sinus pressure and sore throat.   Eyes: Negative for visual disturbance.  Respiratory: Negative for cough, chest tightness, shortness of breath and wheezing.   Cardiovascular: Negative for chest pain and palpitations.  Gastrointestinal: Positive for constipation. Negative for abdominal pain and abdominal distention.  Endocrine: Negative for polydipsia.  Genitourinary: Negative for dysuria and frequency.  Musculoskeletal:  Positive for myalgias. Negative for back pain and arthralgias.  Skin: Negative for rash.  Neurological: Negative for tremors, light-headedness and numbness.  Hematological: Does not bruise/bleed easily.  Psychiatric/Behavioral: Negative for behavioral problems and agitation.    Objective:  BP 98/59 mmHg  Pulse 71  Temp(Src) 98.3 F (36.8 C) (Oral)  Resp 16  Ht 5' (1.524 m)  Wt 150 lb (68.04 kg)  BMI 29.30 kg/m2  SpO2 99%  LMP 03/29/2015  BP/Weight 07/30/2015 07/16/2015 05/18/2015  Systolic BP 98 111 119  Diastolic BP 59 74 85  Wt. (Lbs) 150 152 149  BMI 29.3 29.69 29.1      Physical Exam  Constitutional: She is oriented to person, place, and time. She appears well-developed and well-nourished. No distress.  HENT:  Head: Normocephalic.  Right Ear: External ear normal.  Left Ear: External ear normal.  Nose: Nose normal.  Mouth/Throat: Oropharynx is clear and moist.  Eyes: Conjunctivae and EOM are normal. Pupils are equal, round, and reactive to light.  Neck: Normal range of motion. No JVD present.  Cardiovascular: Normal rate, regular rhythm, normal heart sounds and intact distal pulses.  Exam reveals no gallop.   No murmur heard. Pulmonary/Chest: Effort normal and breath sounds normal. No respiratory distress. She has no wheezes. She has no rales. She exhibits no tenderness.  Abdominal: Soft. Bowel sounds are normal. She exhibits no distension and no mass. There is no tenderness.  Genitourinary: No breast swelling, tenderness or discharge. There is no rash on the right labia. There is no rash on the left labia.  Vaginal discharge (whitish curdy discharge) found.  Cervix: Normal, no cervical motion tenderness.  Musculoskeletal: Normal range of motion. She exhibits no edema or tenderness.  Neurological: She is alert and oriented to person, place, and time. She has normal reflexes.  Skin: Skin is warm and dry. She is not diaphoretic.  Psychiatric: She has a normal mood and  affect.     Assessment & Plan:   1. Screening for malignant neoplasm of cervix - Cervicovaginal ancillary only  2. Absence of menstruation Secondary to recent use of Nexplanon and Depo-Provera  3. Screening for lipid disorders - Lipid panel - COMPLETE METABOLIC PANEL WITH GFR  4. Screening for viral disease - VDRL, Serum  5. Myalgia and myositis Unknown etiology - HIV antibody - methocarbamol (ROBAXIN) 500 MG tablet; Take 1 tablet (500 mg total) by mouth every 8 (eight) hours as needed for muscle spasms.  Dispense: 90 tablet; Refill: 1  6. Gastroesophageal reflux disease without esophagitis - omeprazole (PRILOSEC) 40 MG capsule; Take 1 capsule (40 mg total) by mouth daily.  Dispense: 30 capsule; Refill: 2  7. Other constipation -MiraLAX Dietary modifications to increase intake of fluids and fiber  8. Health care maintenance - Tdap vaccine greater than or equal to 7yo IM  9. Vaginal candidiasis -Treated with Diflucan   Meds ordered this encounter  Medications  . omeprazole (PRILOSEC) 40 MG capsule    Sig: Take 1 capsule (40 mg total) by mouth daily.    Dispense:  30 capsule    Refill:  2  . polyethylene glycol powder (MIRALAX) powder    Sig: Take 255 g by mouth once.    Dispense:  850 g    Refill:  2  . methocarbamol (ROBAXIN) 500 MG tablet    Sig: Take 1 tablet (500 mg total) by mouth every 8 (eight) hours as needed for muscle spasms.    Dispense:  90 tablet    Refill:  1    Follow-up: Return in about 3 months (around 10/27/2015) for Follow-up for myalgias.   Jaclyn Shaggy MD

## 2015-07-31 ENCOUNTER — Other Ambulatory Visit: Payer: Self-pay | Admitting: Family Medicine

## 2015-07-31 DIAGNOSIS — B9689 Other specified bacterial agents as the cause of diseases classified elsewhere: Secondary | ICD-10-CM

## 2015-07-31 DIAGNOSIS — N760 Acute vaginitis: Principal | ICD-10-CM

## 2015-07-31 LAB — CYTOLOGY - PAP

## 2015-07-31 LAB — VDRL: VDRL, Serum: NONREACTIVE

## 2015-07-31 LAB — HIV ANTIBODY (ROUTINE TESTING W REFLEX): HIV: NONREACTIVE

## 2015-07-31 LAB — CERVICOVAGINAL ANCILLARY ONLY
Chlamydia: NEGATIVE
NEISSERIA GONORRHEA: NEGATIVE
TRICH (WINDOWPATH): POSITIVE — AB
WET PREP (BD AFFIRM): POSITIVE — AB

## 2015-07-31 MED ORDER — METRONIDAZOLE 0.75 % VA GEL: 1.0000 | Freq: Every day | VAGINAL | Status: DC

## 2015-08-01 ENCOUNTER — Telehealth: Payer: Self-pay

## 2015-08-01 ENCOUNTER — Other Ambulatory Visit: Payer: Self-pay | Admitting: Family Medicine

## 2015-08-01 DIAGNOSIS — A599 Trichomoniasis, unspecified: Secondary | ICD-10-CM

## 2015-08-01 HISTORY — DX: Trichomoniasis, unspecified: A59.9

## 2015-08-01 MED ORDER — METRONIDAZOLE 500 MG PO TABS: 500.0000 mg | ORAL_TABLET | Freq: Two times a day (BID) | ORAL | Status: DC

## 2015-08-01 NOTE — Telephone Encounter (Signed)
-----   Message from Jaclyn Shaggy, MD sent at 07/31/2015  9:37 AM EST ----- Please inform her that she tested positive for bacterial vaginosis which is not an STD and I have sent a prescription for MetroGel to her pharmacy. The results of her Pap smear and other test are still pending.

## 2015-08-01 NOTE — Telephone Encounter (Signed)
CMA called patient, patient verified name and DOB. Patient was given lab results including HIV status. Patient verbalized she understood with no further questions.     She tested positive for Trichomonas which is an STD; i have sent a prescription for metronidazole to her pharmacy. Her partner needs to be treated too.

## 2015-08-05 LAB — CERVICOVAGINAL ANCILLARY ONLY: HERPES (WINDOWPATH): NEGATIVE

## 2015-08-06 ENCOUNTER — Telehealth: Payer: Self-pay | Admitting: *Deleted

## 2015-08-06 NOTE — Telephone Encounter (Signed)
Verified name and date of birth and gave results 

## 2015-08-06 NOTE — Telephone Encounter (Signed)
-----   Message from Jaclyn Shaggy, MD sent at 08/05/2015  8:27 AM EST ----- Negative for Herpes

## 2015-10-03 ENCOUNTER — Telehealth: Payer: Self-pay | Admitting: Clinical

## 2015-10-03 NOTE — Telephone Encounter (Signed)
Attempt to f/u with pt, voicemail not set up, no message left.

## 2015-10-25 ENCOUNTER — Emergency Department (HOSPITAL_COMMUNITY): Payer: Medicaid Other

## 2015-10-25 ENCOUNTER — Emergency Department (HOSPITAL_COMMUNITY)
Admission: EM | Admit: 2015-10-25 | Discharge: 2015-10-25 | Disposition: A | Discharge status: Home / Self Care | Payer: Medicaid Other | Attending: Emergency Medicine | Admitting: Emergency Medicine

## 2015-10-25 ENCOUNTER — Encounter (HOSPITAL_COMMUNITY): Payer: Self-pay | Admitting: *Deleted

## 2015-10-25 DIAGNOSIS — Y9289 Other specified places as the place of occurrence of the external cause: Secondary | ICD-10-CM | POA: Diagnosis not present

## 2015-10-25 DIAGNOSIS — S43014A Anterior dislocation of right humerus, initial encounter: Secondary | ICD-10-CM | POA: Insufficient documentation

## 2015-10-25 DIAGNOSIS — Y998 Other external cause status: Secondary | ICD-10-CM | POA: Diagnosis not present

## 2015-10-25 DIAGNOSIS — W06XXXA Fall from bed, initial encounter: Secondary | ICD-10-CM | POA: Diagnosis not present

## 2015-10-25 DIAGNOSIS — F17200 Nicotine dependence, unspecified, uncomplicated: Secondary | ICD-10-CM | POA: Insufficient documentation

## 2015-10-25 DIAGNOSIS — S43004A Unspecified dislocation of right shoulder joint, initial encounter: Secondary | ICD-10-CM

## 2015-10-25 DIAGNOSIS — Y9389 Activity, other specified: Secondary | ICD-10-CM | POA: Insufficient documentation

## 2015-10-25 DIAGNOSIS — S4991XA Unspecified injury of right shoulder and upper arm, initial encounter: Secondary | ICD-10-CM | POA: Diagnosis present

## 2015-10-25 MED ORDER — ONDANSETRON HCL 4 MG/2ML IJ SOLN
4.0000 mg | Freq: Once | INTRAMUSCULAR | Status: AC
  Administered 2015-10-25: 4 mg via INTRAVENOUS
  Filled 2015-10-25: qty 2

## 2015-10-25 MED ORDER — IBUPROFEN 600 MG PO TABS: 600.0000 mg | ORAL_TABLET | Freq: Four times a day (QID) | ORAL | Status: DC | PRN

## 2015-10-25 MED ORDER — CYCLOBENZAPRINE HCL 10 MG PO TABS: 10.0000 mg | ORAL_TABLET | Freq: Three times a day (TID) | ORAL | Status: DC | PRN

## 2015-10-25 MED ORDER — FENTANYL CITRATE (PF) 100 MCG/2ML IJ SOLN
25.0000 ug | Freq: Once | INTRAMUSCULAR | Status: AC
  Administered 2015-10-25: 25 ug via INTRAVENOUS
  Filled 2015-10-25: qty 2

## 2015-10-25 MED ORDER — MORPHINE SULFATE (PF) 4 MG/ML IV SOLN
4.0000 mg | Freq: Once | INTRAVENOUS | Status: AC
  Administered 2015-10-25: 4 mg via INTRAVENOUS
  Filled 2015-10-25: qty 1

## 2015-10-25 MED ORDER — PROPOFOL 10 MG/ML IV BOLUS
INTRAVENOUS | Status: AC
  Administered 2015-10-25: 100 mg via INTRAVENOUS
  Filled 2015-10-25: qty 40

## 2015-10-25 MED ORDER — PROPOFOL 10 MG/ML IV BOLUS
1.0000 mg/kg | Freq: Once | INTRAVENOUS | Status: AC
  Administered 2015-10-25: 100 mg via INTRAVENOUS

## 2015-10-25 NOTE — Discharge Instructions (Signed)
How to Use a Shoulder Immobilizer °A shoulder immobilizer is a device that you may have to wear after a shoulder injury or surgery. This device keeps your arm from moving. This prevents additional pain or injury. It also supports your arm next to your body as your shoulder heals. °You may need to wear a shoulder immobilizer to treat a broken bone (fracture) in your shoulder. You may also need to wear one if you have an injury that moves your shoulder out of position (dislocation). There are different types of shoulder immobilizers. The one that you get depends on your injury. °RISKS AND COMPLICATIONS °Wearing a shoulder immobilizer in the wrong way can let your injured shoulder move around too much. This may delay healing and make your pain and swelling worse. °HOW TO USE YOUR SHOULDER IMMOBILIZER °· The part of the immobilizer that goes around your neck (sling) should support your upper arm, with your elbow bent and your lower arm and hand across your chest. °· Make sure that your elbow: °· Is snug against the back pocket of the sling. °· Does not move away from your body. °· The strap of the immobilizer should go over your shoulder and support your arm and hand. Your hand should be slightly higher than your elbow. It should not hang loosely over the edge of the sling. °· If the long strap has a pad, place it where it is most comfortable on your neck. °· Carefully follow your health care provider's instructions for wearing your shoulder immobilizer. Your health care provider may want you to: °· Loosen your immobilizer to straighten your elbow and move your wrist and fingers. You may have to do this several times each day. Ask your health care provider when you should do this and how often. °· Remove your immobilizer once every day to shower, but limit the movement in your injured arm. Before putting the immobilizer back on, use a towel to dry the area under your arm completely. °· Remove your immobilizer to do  shoulder exercises at home as directed by your health care provider. °· Wear your immobilizer while you sleep. You may sleep more comfortably if you have your upper body raised on pillows. °SEEK MEDICAL CARE IF: °· Your immobilizer is not supporting your arm properly. °· Your immobilizer gets damaged. °· You have worsening pain or swelling in your shoulder, arm, or hand. °· Your shoulder, arm, or hand changes color or temperature. °· You lose feeling in your shoulder, arm, or hand. °  °This information is not intended to replace advice given to you by your health care provider. Make sure you discuss any questions you have with your health care provider. °  °Document Released: 07/22/2004 Document Revised: 10/29/2014 Document Reviewed: 05/22/2014 °Elsevier Interactive Patient Education ©2016 [employer] ° °Shoulder Dislocation °A shoulder dislocation happens when the upper arm bone (humerus) moves out of the shoulder joint. The shoulder joint is the part of the shoulder where the humerus, shoulder blade (scapula), and collarbone (clavicle) meet. °CAUSES °This condition is often caused by: °· A fall. °· A hit to the shoulder. °· A forceful movement of the shoulder. °RISK FACTORS °This condition is more likely to develop in people who play sports. °SYMPTOMS °Symptoms of this condition include: °· Deformity of the shoulder. °· Intense pain. °· Inability to move the shoulder. °· Numbness, weakness, or tingling in your neck or down your arm. °· Bruising or swelling around your shoulder. °DIAGNOSIS °This condition is diagnosed with   a physical exam. After the exam, tests may be done to check for related problems. Tests that may be done include: °· X-ray. This may be done to check for broken bones. °· MRI. This may be done to check for damage to the tissues around the shoulder. °· Electromyogram. This may be done to check for nerve damage. °TREATMENT °This condition is treated with a procedure to place the humerus back in  the joint. This procedure is called a reduction. There are two types of reduction: °· Closed reduction. In this procedure, the humerus is placed back in the joint without surgery. The health care provider uses his or her hands to guide the bone back into place. °· Open reduction. In this procedure, the humerus is placed back in the joint with surgery. An open reduction may be recommended if: °¨ You have a weak shoulder joint or weak ligaments. °¨ You have had more than one shoulder dislocation. °¨ The nerves or blood vessels around your shoulder have been damaged. °After the humerus is placed back into the joint, your arm will be placed in a splint or sling to prevent it from moving. You will need to wear the splint or sling until your shoulder heals. When the splint or sling is removed, you may have physical therapy to help improve the range of motion in your shoulder joint. °HOME CARE INSTRUCTIONS °If You Have a Splint or Sling: °· Wear it as told by your health care provider. Remove it only as told by your health care provider. °· Loosen it if your fingers become numb and tingle, or if they turn cold and blue. °· Keep it clean and dry. °Bathing °· Do not take baths, swim, or use a hot tub until your health care provider approves. Ask your health care provider if you can take showers. You may only be allowed to take sponge baths for bathing. °· If your health care provider approves bathing and showering, cover your splint or sling with a watertight plastic bag to protect it from water. Do not let the splint or sling get wet. °Managing Pain, Stiffness, and Swelling °· If directed, apply ice to the injured area. °¨ Put ice in a plastic bag. °¨ Place a towel between your skin and the bag. °¨ Leave the ice on for 20 minutes, 2-3 times per day. °· Move your fingers often to avoid stiffness and to decrease swelling. °· Raise (elevate) the injured area above the level of your heart while you are sitting or lying  down. °Driving °· Do not drive while wearing a splint or sling on a hand that you use for driving. °· Do not drive or operate heavy machinery while taking pain medicine. °Activity °· Return to your normal activities as told by your health care provider. Ask your health care provider what activities are safe for you. °· Perform range-of-motion exercises only as told by your health care provider. °· Exercise your hand by squeezing a soft ball. This helps to decrease stiffness and swelling in your hand and wrist. °General Instructions °· Take over-the-counter and prescription medicines only as told by your health care provider. °· Do not use any tobacco products, including cigarettes, chewing tobacco, or e-cigarettes. Tobacco can delay bone and tissue healing. If you need help quitting, ask your health care provider. °· Keep all follow-up visits as told by your health care provider. This is important. °SEEK MEDICAL CARE IF: °· Your splint or sling gets damaged. °SEEK IMMEDIATE MEDICAL CARE   IF: °· Your pain gets worse rather than better. °· You lose feeling in your arm or hand. °· Your arm or hand becomes white and cold. °  °This information is not intended to replace advice given to you by your health care provider. Make sure you discuss any questions you have with your health care provider. °  °Document Released: 03/09/2001 Document Revised: 03/05/2015 Document Reviewed: 10/07/2014 °Elsevier Interactive Patient Education ©2016 Else[employer] Inc[employer] ° °

## 2015-10-25 NOTE — Sedation Documentation (Signed)
Right shoulder reduced. Placed in sling by Waylan BogaJon, Ortho Tech.

## 2015-10-25 NOTE — ED Notes (Signed)
Bed: WA21 Expected date:  Expected time:  Means of arrival:  Comments: Dislocated shoulder

## 2015-10-25 NOTE — ED Notes (Signed)
Per EMS pt coming from home with c/o right shoulder dislocation, per report pt rolled over in bed and felt shoulder "pop out", she had similar episodes in past, and usually was able to reduce injury on her own this time she couldn't. Pain 7/10, 150 mcg fentanyl given en route.

## 2015-10-25 NOTE — ED Provider Notes (Signed)
CSN: [id number]     Arrival date & time 10/25/15  1327 History   First MD Initiated Contact with Patient 10/25/15 1359     Chief Complaint  Patient presents with  . Shoulder Injury     (Consider location/radiation/quality/duration/timing/severity/associated sxs/prior Treatment) Patient is a 30 y.o. female presenting with shoulder injury.  Shoulder Injury Pertinent negatives include no chest pain, no abdominal pain, no headaches and no shortness of breath.  Trying to pull blanket over head in bed and right shoulder popped out 1 hour ago Tried to put it back in herself, has succeeded in past but could not this time This is probably 10th time she has dislocated shoulder First happened at age 89 No n/v/numbness/weakness Pain right now 10/10, med in ambulance fentanyl helped   Past Medical History  Diagnosis Date  . Shoulder pain    History reviewed. No pertinent past surgical history. Family History  Problem Relation Age of Onset  . COPD Mother   . Diabetes Mother   . Hyperlipidemia Mother   . Hypertension Mother   . Kidney disease Father    Social History  Substance Use Topics  . Smoking status: Current Every Day Smoker  . Smokeless tobacco: None  . Alcohol Use: No     Comment: last drink Jun 29 2015-used to drink pint of liquor daily and 6 pack beer   OB History    No data available     Review of Systems  Constitutional: Negative for fever.  HENT: Negative for sore throat.   Eyes: Negative for visual disturbance.  Respiratory: Negative for cough and shortness of breath.   Cardiovascular: Negative for chest pain.  Gastrointestinal: Negative for abdominal pain.  Genitourinary: Negative for difficulty urinating.  Musculoskeletal: Positive for arthralgias. Negative for back pain and neck pain.  Skin: Negative for rash.  Neurological: Negative for syncope and headaches.      Allergies  Review of patient's allergies indicates no known allergies.  Home  Medications   Prior to Admission medications   Medication Sig Start Date End Date Taking? Authorizing Provider  diphenhydramine-acetaminophen (TYLENOL PM) 25-500 MG TABS tablet Take 1-2 tablets by mouth at bedtime as needed (sleep).    Yes Historical Provider, MD  methocarbamol (ROBAXIN) 500 MG tablet Take 1 tablet (500 mg total) by mouth every 8 (eight) hours as needed for muscle spasms. 07/30/15  Yes Jaclyn ShaggyEnobong Amao, MD  cyclobenzaprine (FLEXERIL) 10 MG tablet Take 1 tablet (10 mg total) by mouth 3 (three) times daily as needed for muscle spasms. 10/25/15   Alvira MondayErin Naoma Boxell, MD  fluconazole (DIFLUCAN) 150 MG tablet Take 1 tablet (150 mg total) by mouth once. Patient not taking: Reported on 10/25/2015 07/30/15   Jaclyn ShaggyEnobong Amao, MD  ibuprofen (ADVIL,MOTRIN) 600 MG tablet Take 1 tablet (600 mg total) by mouth every 6 (six) hours as needed. 10/25/15   Alvira MondayErin Romanita Fager, MD  metroNIDAZOLE (FLAGYL) 500 MG tablet Take 1 tablet (500 mg total) by mouth 2 (two) times daily. Patient not taking: Reported on 10/25/2015 08/01/15   Jaclyn ShaggyEnobong Amao, MD  metroNIDAZOLE (METROGEL VAGINAL) 0.75 % vaginal gel Place 1 Applicatorful vaginally at bedtime. For 5 days. Patient not taking: Reported on 10/25/2015 07/31/15   Jaclyn ShaggyEnobong Amao, MD  naproxen (NAPROSYN) 250 MG tablet Take 1 tablet (250 mg total) by mouth 2 (two) times daily with a meal. Patient not taking: Reported on 10/25/2015 05/18/15   Everlene FarrierWilliam Dansie, PA-C  omeprazole (PRILOSEC) 40 MG capsule Take 1 capsule (40 mg total) by mouth  daily. Patient not taking: Reported on 10/25/2015 07/30/15   Jaclyn Shaggy, MD  ondansetron (ZOFRAN) 4 MG tablet Take 1 tablet (4 mg total) by mouth every 8 (eight) hours as needed for nausea or vomiting. Patient not taking: Reported on 07/30/2015 03/24/15   Trixie Dredge, PA-C  pantoprazole (PROTONIX) 40 MG tablet Take 1 tablet (40 mg total) by mouth daily. Patient not taking: Reported on 10/25/2015 07/16/15   Jaclyn Shaggy, MD  polyethylene glycol powder (MIRALAX)  powder Take 255 g by mouth once. Patient not taking: Reported on 10/25/2015 07/30/15   Jaclyn Shaggy, MD   BP 106/73 mmHg  Pulse 60  Temp(Src) 97.7 F (36.5 C) (Oral)  Resp 12  Ht 5' (1.524 m)  Wt 150 lb (68.04 kg)  BMI 29.30 kg/m2  SpO2 100% Physical Exam  Constitutional: She is oriented to person, place, and time. She appears well-developed and well-nourished. She appears distressed.  HENT:  Head: Normocephalic and atraumatic.  Eyes: Conjunctivae and EOM are normal.  Neck: Normal range of motion.  Cardiovascular: Normal rate, regular rhythm, normal heart sounds and intact distal pulses.  Exam reveals no gallop and no friction rub.   No murmur heard. Pulmonary/Chest: Effort normal and breath sounds normal. No respiratory distress. She has no wheezes. She has no rales.  Abdominal: Soft. She exhibits no distension. There is no tenderness. There is no guarding.  Musculoskeletal: She exhibits no edema.       Right shoulder: She exhibits decreased range of motion, tenderness, bony tenderness, deformity and pain.       Right wrist: She exhibits normal range of motion, no tenderness, no bony tenderness and no laceration.       Right hand: Normal sensation noted. Decreased sensation is not present in the ulnar distribution, is not present in the medial distribution and is not present in the radial distribution. Normal strength noted. She exhibits no finger abduction, no thumb/finger opposition and no wrist extension trouble.  Neurological: She is alert and oriented to person, place, and time.  Skin: Skin is warm and dry. No rash noted. She is not diaphoretic. No erythema.  Nursing note and vitals reviewed.   ED Course  .Sedation Date/Time: 10/25/2015 8:18 PM Performed by: Alvira Monday Authorized by: Alvira Monday  Consent:    Consent obtained:  Written   Consent given by:  Patient   Alternatives discussed:  Analgesia without sedation and regional anesthesia Indications:     Sedation purpose:  Dislocation reduction   Procedure necessitating sedation performed by:  Physician performing sedation   Intended level of sedation:  Moderate (conscious sedation) Pre-sedation assessment:    ASA classification: class 1 - normal, healthy patient     Neck mobility: normal     Mouth opening:  3 or more finger widths   Thyromental distance:  3 finger widths   Mallampati score:  II - soft palate, uvula, fauces visible   Pre-sedation assessments completed and reviewed: airway patency, cardiovascular function, hydration status, mental status and nausea/vomiting   Immediate pre-procedure details:    Reassessment: Patient reassessed immediately prior to procedure   Procedure details (see MAR for exact dosages):    Sedation start time:  10/25/2015 3:45 PM   Preoxygenation:  Nasal cannula   Sedation:  Propofol   Analgesia:  Fentanyl   Intra-procedure monitoring:  Blood pressure monitoring, cardiac monitor, continuous capnometry, continuous pulse oximetry, frequent vital sign checks and frequent LOC assessments   Intra-procedure events: none     Sedation end time:  10/25/2015 4:05 PM Post-procedure details:    Post-sedation assessments completed and reviewed: cardiovascular function and mental status     Patient is stable for discharge or admission: Yes     Patient tolerance:  Tolerated well, no immediate complications Reduction of dislocation Date/Time: 10/25/2015 3:45 PM Performed by: Alvira Monday Authorized by: Alvira Monday Consent: Verbal consent obtained. Written consent obtained. Risks and benefits: risks, benefits and alternatives were discussed Consent given by: patient Patient understanding: patient states understanding of the procedure being performed Patient consent: the patient's understanding of the procedure matches consent given Procedure consent: procedure consent matches procedure scheduled Relevant documents: relevant documents present and  verified Test results: test results available and properly labeled Site marked: the operative site was marked Imaging studies: imaging studies available Required items: required blood products, implants, devices, and special equipment available Patient identity confirmed: verbally with patient Time out: Immediately prior to procedure a "time out" was called to verify the correct patient, procedure, equipment, support staff and site/side marked as required. Local anesthesia used: no Patient sedated: yes Sedatives: propofol Sedation start date/time: 10/25/2015 3:45 PM Sedation end date/time: 10/25/2015 4:05 PM Vitals: Vital signs were monitored during sedation. Patient tolerance: Patient tolerated the procedure well with no immediate complications   (including critical care time) Labs Review Labs Reviewed - No data to display  Imaging Review Dg Shoulder Right  10/25/2015  CLINICAL DATA:  Larey Seat and dislocated right shoulder EXAM: RIGHT SHOULDER - 2+ VIEW COMPARISON:  05/18/2015 FINDINGS: Suboptimal study due to limited positioning. The humeral head does appear to be positioned anteriorly relative to the glenoid on the scapula Y-view. No evidence of fracture. IMPRESSION: Limited study.  Possible anterior dislocation. Electronically Signed   By: Esperanza Heir M.D.   On: 10/25/2015 14:29   Dg Shoulder Right Port  10/25/2015  CLINICAL DATA:  Status post EXAM: PORTABLE RIGHT SHOULDER - 2+ VIEW COMPARISON:  None. FINDINGS: Interval reduction of the glenohumeral joint. Osseous structures are in anatomic alignment. No fracture identified. IMPRESSION: 1. Interval reduction of glenohumeral joint. Electronically Signed   By: Signa Kell M.D.   On: 10/25/2015 16:39   I have personally reviewed and evaluated these images and lab results as part of my medical decision-making.   EKG Interpretation None      MDM   Final diagnoses:  Shoulder dislocation, right, initial encounter   30yo female  with history of shoulder pain and shoulder dislocations presents with concern for right shoulder dislocation. Pt reports she has had multiple dislocations in the past and over the last few years has been able to reduce them at home.  Pt in pain, neurovascularly intact.  XR limited by positioning, however shows possible anterior dislocation and clinical history and exam concerning for anterior shoulder dislocation.  Patient given propofol sedation and reduction performed with success.  Post reduction XR shows reduction. Pt NV intact.  Placed in shoulder immobilizer and recommend follow up with Orthopedics. Patient discharged in stable condition with understanding of reasons to return.   Alvira Monday, MD 10/25/15 2037

## 2015-11-12 ENCOUNTER — Encounter: Payer: Self-pay | Admitting: Physical Therapy

## 2015-11-12 ENCOUNTER — Ambulatory Visit: Payer: Medicaid Other | Attending: Orthopedic Surgery | Admitting: Physical Therapy

## 2015-11-12 DIAGNOSIS — M546 Pain in thoracic spine: Secondary | ICD-10-CM | POA: Diagnosis present

## 2015-11-12 DIAGNOSIS — M542 Cervicalgia: Secondary | ICD-10-CM | POA: Diagnosis present

## 2015-11-12 DIAGNOSIS — M25511 Pain in right shoulder: Secondary | ICD-10-CM | POA: Insufficient documentation

## 2015-11-12 NOTE — Therapy (Addendum)
[place], [place], [address] Phone: [phone]   Fax:  [phone]  Physical Therapy Evaluation/Discharge Summary  Patient Details  Name: Ann Clark MRN: [id number] Date of Birth: [birth year] No Data Recorded  Encounter Date: 11/12/2015      PT End of Session - 11/12/15 1759    Visit Number 1   Number of Visits 9   Date for PT Re-Evaluation 12/12/15   Authorization Type medicaid, self pay   PT Start Time 2458   PT Stop Time 1500   PT Time Calculation (min) 45 min   Activity Tolerance Patient limited by fatigue;No increased pain   Behavior During Therapy [facility] for tasks assessed/performed      Past Medical History  Diagnosis Date  . Shoulder pain     History reviewed. No pertinent past surgical history.  There were no vitals filed for this visit.       Subjective Assessment - 11/12/15 1420    Subjective Reports dislocations over the last 15 years. typically by reaching [place] or back. Shoulder dislocations causing pain in    Limitations Sitting;Lifting;Standing;Walking;Writing;House hold activities   How long can you sit comfortably? constant discomfort   Patient Stated Goals brush teeth, lifting, reaching behind/overhead   Currently in Pain? Yes   Pain Location Shoulder   Pain Orientation Right   Pain Descriptors / Indicators Aching;Sore   Pain Type Acute pain   Pain Radiating Towards RUE   Aggravating Factors  reaching behind and overhead result in dislocations   Pain Relieving Factors rest            [facility] PT Assessment - 11/12/15 0001    Assessment   Medical Diagnosis recurrent anterior R GHJ dislocation   Onset Date/Surgical Date --  no surgery   Hand Dominance Right   Prior Therapy none   Precautions   Precautions None   Restrictions   Weight Bearing Restrictions No   Balance Screen   Has the patient fallen in the past 6 months No   Prior Function   Level of  Independence Independent   Vocation --  not currently working   Cognition   Overall Cognitive Status Within Functional Limits for tasks assessed   Observation/Other Assessments   Observations --   Posture/Postural Control   Posture Comments kyphotic posture with forward head, R winging scapula   ROM / Strength   AROM / PROM / Strength Strength   Strength   Strength Assessment Site Shoulder   Right/Left Shoulder Right   Right Shoulder Extension 4/5   Right Shoulder ABduction 4-/5   Right Shoulder Internal Rotation 4-/5   Right Shoulder External Rotation 4-/5                   [facility] Adult PT Treatment/Exercise - 11/12/15 0001    Exercises   Exercises Shoulder   Shoulder Exercises: Supine   Other Supine Exercises serratus punches   Shoulder Exercises: Seated   Other Seated Exercises scapular retractions   Shoulder Exercises: Standing   External Rotation 20 reps   External Rotation Limitations leaning against wall.    Shoulder Exercises: ROM/Strengthening   Modified Plank Limitations hands on table, A/P and lat weight shift   Other ROM/Strengthening Exercises 6-way isometric                PT Education - 11/12/15 1759    Education provided Yes   Education Details anatomy of condition, POC, HEP  Person(s) Educated Patient   Methods Explanation;Demonstration;Tactile cues;Verbal cues;Handout   Comprehension Verbalized understanding;Returned demonstration;Verbal cues required;Tactile cues required;Need further instruction             PT Long Term Goals - 11/12/15 1804    PT LONG TERM GOAL #1   Title Pt will be able to brush teeth without limitation by 6/10   Time 4   Period Weeks   Status New   PT LONG TERM GOAL #2   Title Verbalize improved ability to reach overhead   Time 4   Period Weeks   Status New   PT LONG TERM GOAL #3   Title Able to perform 6-way isometrics with 30 sec holds, minimal difficutly   Time 4   Period Weeks   Status New    PT LONG TERM GOAL #4   Title Pt will verbalize minimal discomfort in cervical and thoracic spine   Time 4   Period Weeks   Status New   PT LONG TERM GOAL #5   Title Independent with HEP   Time 4   Period Weeks   Status New               Plan - 11/12/15 1801    Clinical Impression Statement Pt presents with 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 Pt MMT are fair but has severe lack of endurance of periscapular musculature as well as postural extensors. Pt c/o cervical and thoracic pain as would be expected with dislocations. Will benefit from skille PT in order to improve strength and endurance of GHJ and periscapular musculature to provide support to GHJ. Pt reports MD saying she may require surgery and was educated on benefits of strengthening prior to surgery if that was done.    Rehab Potential Fair   Clinical Impairments Affecting Rehab Potential recurrent dislocations   PT Frequency 2x / week   PT Duration 4 weeks   PT Treatment/Interventions ADLs/Self Care Home Management;Cryotherapy;Electrical Stimulation;Ultrasound;Moist Heat;Iontophoresis 20m/ml Dexamethasone;Functional mobility training;Therapeutic activities;Therapeutic exercise;Patient/family education;Neuromuscular re-education;Manual techniques;Taping;Dry needling;Passive range of motion   PT Next Visit Plan GHJ stabilization exercises   PT Home Exercise Plan see pt instructions   Consulted and Agree with Plan of Care Patient      Patient will benefit from skilled therapeutic intervention in order to improve the following deficits and impairments:  Improper body mechanics, Pain, Postural dysfunction, Decreased activity tolerance, Decreased endurance, Decreased strength, Impaired UE functional use  Visit Diagnosis: Pain in right shoulder  Cervicalgia  Pain in thoracic spine     Problem List Patient Active Problem List   Diagnosis Date Noted  .  Trichomonas infection 08/01/2015  . Myalgia and myositis 07/30/2015  . GERD (gastroesophageal reflux disease) 07/30/2015  . Arthralgia 07/16/2015  . Bipolar I disorder ([place] 07/16/2015   Micha Dosanjh C. Jermar Colter PT, DPT 11/12/2015 6:13 PM   [facility] SpringsCThe [facility]GElkton NAl[place] [address]Phone: [phone]  Fax:  [phone] Name: ETOMICA ARSENEAULTMRN: [id number] of Birth: 102-05-1986 PHYSICAL THERAPY DISCHARGE SUMMARY  Visits from Start of Care: 1  Current functional level related to goals / functional outcomes: See above   Remaining deficits: See above   Education / Equipment: Anatomy of condition, POC, HEP  Plan: Patient agrees to discharge.  Patient goals were not met. Patient is being discharged due to not returning since the last visit.  ?????     Endi Lagman C. Lacye Mccarn  PT, DPT 02/06/16 8:11 AM

## 2015-11-27 ENCOUNTER — Telehealth: Payer: Self-pay | Admitting: Family Medicine

## 2015-11-27 DIAGNOSIS — K219 Gastro-esophageal reflux disease without esophagitis: Secondary | ICD-10-CM

## 2015-11-27 MED ORDER — OMEPRAZOLE 40 MG PO CPDR: 40.0000 mg | DELAYED_RELEASE_CAPSULE | Freq: Every day | ORAL | Status: DC

## 2015-11-27 NOTE — Telephone Encounter (Signed)
Patient would like to know if she can get pain medication (tylenol) for her shoulder pain until she is seen by the Ortho.......  Please follow up   Patient is also requesting her acid reflux....omeprazole

## 2015-11-27 NOTE — Telephone Encounter (Signed)
Return call placed to patient to follow-up on shoulder pain. Call placed to #[phone]; unable to reach. Was informed incorrect number. In addition, call placed to #[phone]; unable to reach. HIPPA compliant message left requesting return call.  Refilled omeprazole per clinic protocol. Awaiting return call from patient.

## 2015-12-25 ENCOUNTER — Other Ambulatory Visit: Payer: Self-pay | Admitting: Orthopedic Surgery

## 2015-12-25 DIAGNOSIS — M25511 Pain in right shoulder: Secondary | ICD-10-CM

## 2016-01-03 ENCOUNTER — Emergency Department (HOSPITAL_COMMUNITY)
Admission: EM | Admit: 2016-01-03 | Discharge: 2016-01-03 | Disposition: A | Discharge status: Home / Self Care | Payer: Medicaid Other | Attending: Emergency Medicine | Admitting: Emergency Medicine

## 2016-01-03 ENCOUNTER — Encounter (HOSPITAL_COMMUNITY): Payer: Self-pay

## 2016-01-03 ENCOUNTER — Emergency Department (HOSPITAL_COMMUNITY): Payer: Medicaid Other

## 2016-01-03 DIAGNOSIS — F17200 Nicotine dependence, unspecified, uncomplicated: Secondary | ICD-10-CM | POA: Insufficient documentation

## 2016-01-03 DIAGNOSIS — M542 Cervicalgia: Secondary | ICD-10-CM | POA: Diagnosis present

## 2016-01-03 DIAGNOSIS — M436 Torticollis: Secondary | ICD-10-CM | POA: Diagnosis not present

## 2016-01-03 MED ORDER — METHOCARBAMOL 500 MG PO TABS: 500.0000 mg | ORAL_TABLET | Freq: Two times a day (BID) | ORAL | Status: DC | PRN

## 2016-01-03 MED ORDER — DIAZEPAM 5 MG PO TABS
5.0000 mg | ORAL_TABLET | Freq: Once | ORAL | Status: AC
  Administered 2016-01-03: 5 mg via ORAL
  Filled 2016-01-03: qty 1

## 2016-01-03 MED ORDER — KETOROLAC TROMETHAMINE 30 MG/ML IJ SOLN
30.0000 mg | Freq: Once | INTRAMUSCULAR | Status: AC
  Administered 2016-01-03: 30 mg via INTRAMUSCULAR
  Filled 2016-01-03: qty 1

## 2016-01-03 MED ORDER — NAPROXEN 500 MG PO TABS: 500.0000 mg | ORAL_TABLET | Freq: Two times a day (BID) | ORAL | Status: DC

## 2016-01-03 NOTE — ED Provider Notes (Signed)
CSN: [id number]     Arrival date & time 01/03/16  1608 History  By signing my name below, I, Rosario AdieWilliam Andrew Hiatt, attest that this documentation has been prepared under the direction and in the presence of Everlene FarrierWilliam Tymira Horkey, PA-C.   Electronically Signed: Rosario AdieWilliam Andrew Hiatt, ED Scribe. 01/03/2016. 4:57 PM.   Chief Complaint  Patient presents with  . Neck Pain   The history is provided by the patient and medical records. No language interpreter was used.   HPI Comments: Ann Clark is a 30 y.o. female who presents to the Emergency Department complaining of sudden onset, gradually worsening, constant, midline neck pain and neck stiffness onset PTA. She states that her pain and stiffness have been occuring intermittently after she had a right shoulder dislocation and relocation procedure on 10/25/15, but it began most recently today after she woke up from a nap PTA.  She has associated upper back pain. She reports intermittent tingling sensation in her bilateral arms and reports that it hurts to move her arms. Pt reports that when her pain and stiffness occur that she has been taking Ibuprofen, Trazodone, Gabapentin, and muscle relaxants with no noted relief of her symptoms. Her pain is worsened with movement of her neck. Pt denies any recent falls or trauma to her neck. She denies fever, IV drug use, headache, changes to her vision, body aches, or tingling.  Past Medical History  Diagnosis Date  . Shoulder pain    History reviewed. No pertinent past surgical history. Family History  Problem Relation Age of Onset  . COPD Mother   . Diabetes Mother   . Hyperlipidemia Mother   . Hypertension Mother   . Kidney disease Father    Social History  Substance Use Topics  . Smoking status: Current Every Day Smoker  . Smokeless tobacco: None  . Alcohol Use: No     Comment: last drink Jun 29 2015-used to drink pint of liquor daily and 6 pack beer   OB History    No data available     Review of  Systems  Constitutional: Negative for fever and chills.  Eyes: Negative for visual disturbance.  Respiratory: Negative for cough and shortness of breath.   Gastrointestinal: Negative for nausea, vomiting and abdominal pain.  Musculoskeletal: Positive for myalgias, back pain (upper) and neck pain.  Skin: Negative for rash and wound.  Neurological: Positive for numbness. Negative for dizziness, weakness, light-headedness and headaches.       Negative for tingling.   Allergies  Review of patient's allergies indicates no known allergies.  Home Medications   Prior to Admission medications   Medication Sig Start Date End Date Taking? Authorizing Provider  cyclobenzaprine (FLEXERIL) 10 MG tablet Take 1 tablet (10 mg total) by mouth 3 (three) times daily as needed for muscle spasms. 10/25/15   Alvira MondayErin Schlossman, MD  diphenhydramine-acetaminophen (TYLENOL PM) 25-500 MG TABS tablet Take 1-2 tablets by mouth at bedtime as needed (sleep).     Historical Provider, MD  fluconazole (DIFLUCAN) 150 MG tablet Take 1 tablet (150 mg total) by mouth once. Patient not taking: Reported on 10/25/2015 07/30/15   Jaclyn ShaggyEnobong Amao, MD  ibuprofen (ADVIL,MOTRIN) 600 MG tablet Take 1 tablet (600 mg total) by mouth every 6 (six) hours as needed. 10/25/15   Alvira MondayErin Schlossman, MD  methocarbamol (ROBAXIN) 500 MG tablet Take 1 tablet (500 mg total) by mouth 2 (two) times daily as needed for muscle spasms. 01/03/16   Everlene FarrierWilliam Khadijah Mastrianni, PA-C  metroNIDAZOLE (FLAGYL)  500 MG tablet Take 1 tablet (500 mg total) by mouth 2 (two) times daily. Patient not taking: Reported on 10/25/2015 08/01/15   Jaclyn Shaggy, MD  metroNIDAZOLE (METROGEL VAGINAL) 0.75 % vaginal gel Place 1 Applicatorful vaginally at bedtime. For 5 days. Patient not taking: Reported on 10/25/2015 07/31/15   Jaclyn Shaggy, MD  naproxen (NAPROSYN) 500 MG tablet Take 1 tablet (500 mg total) by mouth 2 (two) times daily with a meal. 01/03/16   Everlene Farrier, PA-C  omeprazole (PRILOSEC) 40 MG  capsule Take 1 capsule (40 mg total) by mouth daily. 11/27/15   Jaclyn Shaggy, MD  ondansetron (ZOFRAN) 4 MG tablet Take 1 tablet (4 mg total) by mouth every 8 (eight) hours as needed for nausea or vomiting. Patient not taking: Reported on 07/30/2015 03/24/15   Trixie Dredge, PA-C  pantoprazole (PROTONIX) 40 MG tablet Take 1 tablet (40 mg total) by mouth daily. Patient not taking: Reported on 10/25/2015 07/16/15   Jaclyn Shaggy, MD  polyethylene glycol powder (MIRALAX) powder Take 255 g by mouth once. Patient not taking: Reported on 10/25/2015 07/30/15   Jaclyn Shaggy, MD   BP 117/88 mmHg  Pulse 80  Temp(Src) 98.3 F (36.8 C)  Resp 18  SpO2 100%  LMP 01/01/2016   Physical Exam  Constitutional: She is oriented to person, place, and time. She appears well-developed and well-nourished. No distress.  Nontoxic appearing.  HENT:  Head: Normocephalic and atraumatic.  Right Ear: External ear normal.  Left Ear: External ear normal.  Mouth/Throat: Oropharynx is clear and moist. No oropharyngeal exudate.  Eyes: Conjunctivae and EOM are normal. Pupils are equal, round, and reactive to light. Right eye exhibits no discharge. Left eye exhibits no discharge.  Neck: Neck supple. No JVD present. No spinous process tenderness present. No tracheal deviation and no erythema present. No Brudzinski's sign and no Kernig's sign noted.  No midline neck tenderness. Patient is able to place her chin to her chest and look up at the ceiling. She is able to rotate her head greater than 45 each direction. No meningeal signs.  Cardiovascular: Normal rate, regular rhythm, normal heart sounds and intact distal pulses.   Bilateral radial pulses are intact. Good capillary refill to her bilateral distal fingertips.  Pulmonary/Chest: Effort normal and breath sounds normal. No stridor. No respiratory distress. She has no wheezes. She has no rales.  Abdominal: Soft. There is no tenderness.  Musculoskeletal: Normal range of motion.  5/5  strength in her bilateral UEs.  Lymphadenopathy:    She has no cervical adenopathy.  Neurological: She is alert and oriented to person, place, and time. No cranial nerve deficit. Coordination normal.  Sensation is intact to her bilateral UEs.   Skin: Skin is warm and dry. No rash noted. She is not diaphoretic. No erythema. No pallor.  Psychiatric: She has a normal mood and affect. Her behavior is normal.  Nursing note and vitals reviewed.  ED Course  Procedures (including critical care time)  DIAGNOSTIC STUDIES: Oxygen Saturation is 100% on RA, normal by my interpretation.   COORDINATION OF CARE: 4:57 PM-Discussed next steps with pt including DG neck. Pt verbalized understanding and is agreeable with the plan.   Imaging Review Dg Cervical Spine Complete  01/03/2016  CLINICAL DATA:  30 year old female with neck stiffness since yesterday. EXAM: CERVICAL SPINE - COMPLETE 4+ VIEW COMPARISON:  05/03/2011. FINDINGS: There is no evidence of cervical spine fracture or prevertebral soft tissue swelling. Alignment is normal. No other significant bone abnormalities are identified.  IMPRESSION: Negative cervical spine radiographs. Electronically Signed   By: Trudie Reed M.D.   On: 01/03/2016 17:41   I have personally reviewed and evaluated these images as part of my medical decision-making.  Filed Vitals:   01/03/16 1613  BP: 117/88  Pulse: 80  Temp: 98.3 F (36.8 C)  Resp: 18   MDM   Meds given in ED:  Medications  diazepam (VALIUM) tablet 5 mg (5 mg Oral Given 01/03/16 1706)  ketorolac (TORADOL) 30 MG/ML injection 30 mg (30 mg Intramuscular Given 01/03/16 1706)    Discharge Medication List as of 01/03/2016  5:53 PM      Final diagnoses:  Torticollis, acute   This is a 30 y.o. female who presents to the Emergency Department complaining of sudden onset, gradually worsening, constant, midline neck pain and neck stiffness onset PTA. She states that her pain and stiffness have been  occuring intermittently after she had a right shoulder dislocation and relocation procedure on 10/25/15, but it began most recently today after she woke up from a nap PTA.  She has associated upper back pain. She reports intermittent tingling sensation in her bilateral arms and reports that it hurts to move her arms. On exam the patient is afebrile and nontoxic appearing. She has no focal neurological deficits. No midline neck tenderness. No meningeal signs. Suspect neck muscle spasm which is possibly related to her previous shoulder injury. Patient is concerned about her neck and is requesting x-rays. Cervical spine x-ray was obtained which was unremarkable. I am not concerned for meningitis, cervical spine fracture or epidural abscess. Patient received Valium and Toradol in the emergency department with some improvement of her pain. We'll discharge her with naproxen and Robaxin and have her follow closely with her orthopedic surgeon and primary care doctor. I discussed return precautions. I advised the patient to follow-up with their primary care provider this week. I advised the patient to return to the emergency department with new or worsening symptoms or new concerns. The patient verbalized understanding and agreement with plan.    This patient was discussed with Dr. Effie Shy who agrees with assessment and plan.  I personally performed the services described in this documentation, which was scribed in my presence. The recorded information has been reviewed and is accurate.       Everlene Farrier, PA-C 01/03/16 1805  Mancel Bale, MD 01/04/16 [phone]

## 2016-01-03 NOTE — Discharge Instructions (Signed)
Acute Torticollis °Torticollis is a condition in which the muscles of the neck tighten (contract) abnormally, causing the neck to twist and the head to move into an unnatural position. Torticollis that develops suddenly is called acute torticollis. If torticollis becomes chronic and is left untreated, the face and neck can become deformed. °CAUSES °This condition may be caused by: °· Sleeping in an awkward position (common). °· Extending or twisting the neck muscles beyond their normal position. °· Infection. °In some cases, the cause may not be known. °SYMPTOMS °Symptoms of this condition include: °· An unnatural position of the head. °· Neck pain. °· A limited ability to move the neck. °· Twisting of the neck to one side. °DIAGNOSIS °This condition is diagnosed with a physical exam. You may also have imaging tests, such as an X-ray, CT scan, or MRI. °TREATMENT °Treatment for this condition involves trying to relax the neck muscles. It may include: °· Medicines or shots. °· Physical therapy. °· Surgery. This may be done in severe cases. °HOME CARE INSTRUCTIONS °· Take medicines only as directed by your health care provider. °· Do stretching exercises and massage your neck as directed by your health care provider. °· Keep all follow-up visits as directed by your health care provider. This is important. °SEEK MEDICAL CARE IF: °· You develop a fever. °SEEK IMMEDIATE MEDICAL CARE IF: °· You develop difficulty breathing. °· You develop noisy breathing (stridor). °· You start drooling. °· You have trouble swallowing or have pain with swallowing. °· You develop numbness or weakness in your hands or feet. °· You have changes in your speech, understanding, or vision. °· Your pain gets worse. °  °This information is not intended to replace advice given to you by your health care provider. Make sure you discuss any questions you have with your health care provider. °  °Document Released: 06/11/2000 Document Revised:  10/29/2014 Document Reviewed: 06/10/2014 °Elsevier Interactive Patient Education ©2016 [employer] ° °

## 2016-01-03 NOTE — ED Notes (Signed)
patient complains of neck pain/ stiffness after a nap today. States she has had same in past but normally resolves without intervention for the past 6 months

## 2016-01-07 ENCOUNTER — Ambulatory Visit
Admission: RE | Admit: 2016-01-07 | Discharge: 2016-01-07 | Disposition: A | Discharge status: Home / Self Care | Payer: Medicaid Other | Source: Ambulatory Visit | Attending: Orthopedic Surgery | Admitting: Orthopedic Surgery

## 2016-01-07 DIAGNOSIS — M25511 Pain in right shoulder: Secondary | ICD-10-CM

## 2016-01-07 MED ORDER — IOPAMIDOL (ISOVUE-M 200) INJECTION 41%
15.0000 mL | Freq: Once | INTRAMUSCULAR | Status: AC
  Administered 2016-01-07: 10 mL via INTRA_ARTICULAR

## 2016-01-30 ENCOUNTER — Other Ambulatory Visit: Payer: Self-pay | Admitting: Orthopedic Surgery

## 2016-02-02 ENCOUNTER — Ambulatory Visit: Payer: Medicaid Other

## 2016-02-20 ENCOUNTER — Encounter (HOSPITAL_COMMUNITY): Payer: Self-pay | Admitting: *Deleted

## 2016-02-20 MED ORDER — CEFAZOLIN SODIUM-DEXTROSE 2-4 GM/100ML-% IV SOLN: 2.0000 g | INTRAVENOUS | Status: DC

## 2016-02-20 NOTE — Progress Notes (Signed)
Unable to reach patient on the phone.  No voice mail.

## 2016-02-23 ENCOUNTER — Ambulatory Visit (HOSPITAL_COMMUNITY): Admission: RE | Admit: 2016-02-23 | Payer: Medicaid Other | Source: Ambulatory Visit | Admitting: Orthopedic Surgery

## 2016-02-23 ENCOUNTER — Encounter (HOSPITAL_COMMUNITY): Admission: RE | Payer: Self-pay | Source: Ambulatory Visit

## 2016-02-23 SURGERY — ARTHROSCOPY, SHOULDER, WITH GLENOID LABRUM REPAIR
Anesthesia: General | Site: Shoulder | Laterality: Right

## 2016-02-23 NOTE — H&P (Signed)
Ann Clark is an 30 y.o. female.   Chief Complaint: Right shoulder instability HPI: Ann Clark is a 30 year old female with long history of right shoulder instability.  She's had multiple dislocation episodes spanning over several years.  She is failed conservative management.  MRI scan shows A lesion off of the anterior inferior glenoid.  This dislocation and shoulder instability interferes with her activities of daily living.  Past Medical History:  Diagnosis Date  . Shoulder pain     No past surgical history on file.  Family History  Problem Relation Age of Onset  . COPD Mother   . Diabetes Mother   . Hyperlipidemia Mother   . Hypertension Mother   . Kidney disease Father    Social History:  reports that she has been smoking.  She does not have any smokeless tobacco history on file. She reports that she uses drugs, including Marijuana. She reports that she does not drink alcohol.  Allergies:  Allergies  Allergen Reactions  . No Known Allergies     No prescriptions prior to admission.    No results found for this or any previous visit (from the past 48 hour(s)). No results found.  Review of Systems  Musculoskeletal: Positive for joint pain.  All other systems reviewed and are negative.   There were no vitals taken for this visit. Physical Exam  Constitutional: She appears well-developed.  HENT:  Head: Normocephalic.  Neck: Normal range of motion.  Cardiovascular: Normal rate.   Respiratory: Effort normal.  Neurological: She is alert.  Skin: Skin is warm.  Psychiatric: She has a normal mood and affect.   examination of right shoulder demonstrates maintained passive range of motion good stability posteriorly but with anterior apprehension less than a centimeter sulcus sign is present not much in way of hyper laxity is present.  Rotator cuff strength is intact O'Brien's testing is negative  Assessment/Plan Impression is right shoulder instability with labral  pathology present on MRI arthrogram of the right shoulder she's had multiple dislocations and will likely continue to dislocate without stabilization.  The risks and benefits of surgery discussed with the patient including not limited to infection or vessel damage shoulder stiffness.  Slightly we will trade some degree of motion for stability.  Patient understands and just wants her shoulder to be more stable so that she can try to lead to more normal life AND answered anticipate outpatient surgery with discharge home same day  Cammy CopaEAN,Ceilidh Torregrossa SCOTT, MD 02/23/2016, 1:15 PM

## 2016-03-25 ENCOUNTER — Other Ambulatory Visit: Payer: Self-pay | Admitting: Family Medicine

## 2016-03-25 DIAGNOSIS — K219 Gastro-esophageal reflux disease without esophagitis: Secondary | ICD-10-CM

## 2016-04-07 ENCOUNTER — Other Ambulatory Visit (HOSPITAL_COMMUNITY)
Admission: RE | Admit: 2016-04-07 | Discharge: 2016-04-07 | Disposition: A | Discharge status: Home / Self Care | Payer: Medicaid Other | Source: Ambulatory Visit | Attending: Family Medicine | Admitting: Family Medicine

## 2016-04-07 ENCOUNTER — Telehealth: Payer: Self-pay | Admitting: Licensed Clinical Social Worker

## 2016-04-07 ENCOUNTER — Ambulatory Visit: Payer: Medicaid Other | Attending: Family Medicine | Admitting: Family Medicine

## 2016-04-07 ENCOUNTER — Encounter (INDEPENDENT_AMBULATORY_CARE_PROVIDER_SITE_OTHER): Payer: Self-pay

## 2016-04-07 ENCOUNTER — Encounter: Payer: Self-pay | Admitting: Family Medicine

## 2016-04-07 ENCOUNTER — Encounter: Payer: Self-pay | Admitting: Licensed Clinical Social Worker

## 2016-04-07 VITALS — BP 105/71 | HR 68 | Temp 98.0°F | Ht 62.0 in | Wt 165.2 lb

## 2016-04-07 DIAGNOSIS — Z79899 Other long term (current) drug therapy: Secondary | ICD-10-CM | POA: Diagnosis not present

## 2016-04-07 DIAGNOSIS — M542 Cervicalgia: Secondary | ICD-10-CM | POA: Insufficient documentation

## 2016-04-07 DIAGNOSIS — R51 Headache: Secondary | ICD-10-CM | POA: Diagnosis not present

## 2016-04-07 DIAGNOSIS — M25511 Pain in right shoulder: Secondary | ICD-10-CM | POA: Diagnosis not present

## 2016-04-07 DIAGNOSIS — N760 Acute vaginitis: Secondary | ICD-10-CM | POA: Diagnosis present

## 2016-04-07 DIAGNOSIS — B373 Candidiasis of vulva and vagina: Secondary | ICD-10-CM | POA: Diagnosis not present

## 2016-04-07 DIAGNOSIS — Z7251 High risk heterosexual behavior: Secondary | ICD-10-CM | POA: Diagnosis not present

## 2016-04-07 DIAGNOSIS — K219 Gastro-esophageal reflux disease without esophagitis: Secondary | ICD-10-CM | POA: Diagnosis not present

## 2016-04-07 DIAGNOSIS — Z113 Encounter for screening for infections with a predominantly sexual mode of transmission: Secondary | ICD-10-CM | POA: Diagnosis present

## 2016-04-07 DIAGNOSIS — G44209 Tension-type headache, unspecified, not intractable: Secondary | ICD-10-CM

## 2016-04-07 DIAGNOSIS — B3731 Acute candidiasis of vulva and vagina: Secondary | ICD-10-CM

## 2016-04-07 DIAGNOSIS — N898 Other specified noninflammatory disorders of vagina: Secondary | ICD-10-CM | POA: Diagnosis present

## 2016-04-07 LAB — POCT URINE PREGNANCY: PREG TEST UR: NEGATIVE

## 2016-04-07 MED ORDER — OMEPRAZOLE 40 MG PO CPDR: 40.0000 mg | DELAYED_RELEASE_CAPSULE | Freq: Every day | ORAL | 2 refills | Status: DC

## 2016-04-07 MED ORDER — IBUPROFEN 600 MG PO TABS: 600.0000 mg | ORAL_TABLET | Freq: Four times a day (QID) | ORAL | 2 refills | Status: DC | PRN

## 2016-04-07 MED ORDER — FLUCONAZOLE 150 MG PO TABS: 150.0000 mg | ORAL_TABLET | Freq: Once | ORAL | 0 refills | Status: AC

## 2016-04-07 MED ORDER — METHOCARBAMOL 500 MG PO TABS: 500.0000 mg | ORAL_TABLET | Freq: Two times a day (BID) | ORAL | 1 refills | Status: DC | PRN

## 2016-04-07 NOTE — Telephone Encounter (Signed)
04/07/2016 3:00 pm  Plan of Action: LCSWA encouraged pt to contact her or crisis resources if depression and anxiety symptoms worsen or fail to improve. LCSWA will follow up with pt to schedule Behavioral Health appointment and inquire if [facility].  LCSWA contacted pt via telephone. LCSWA introduced self and explained [facility]. LCSWA disclosed that she wanted to address depression and anxiety due to results from completed health screening.   Pt. reported that she has been diagnosed with Depression and Bi-Polar two years ago. She reported that she has had a recent onset of anxiety. Pt's symptoms of depression and anxiety include difficulty sleeping, low motivation to attend appointments, and withdrawing from loved ones. Pt tries to cope with mental health by "staying to myself". LCSWA commended pt on having the motivation to attend her appointment with Dr. Venetia NightAmao. Pt reported that her eight year son gives her strength to push through and she is interested in initiating services (Psychiatry and Counseling)    Pt's support system includes her mother, who she resides with. Pt is the sole provider for her son. She is currently unemployed due to ongoing should pain that prevents her from working.    Pt has participated in services with [facility]; however, discontinued services after a negative experience with staff. LCSWA provided pt with information on [facility] Medication [facility] and Management. LCSWA also informed pt that she can schedule counseling sessions with [facility]. Pt verbalized understanding and interest in initiating services.     Jenel LucksJasmine Lewis, MSW, LCSWA 3:55 PM

## 2016-04-07 NOTE — Progress Notes (Signed)
Subjective:  Patient ID: Ann Clark, female    DOB: 1986-05-09  Age: 30 y.o. MRN: [id number]  CC: Vaginal Discharge; Abdominal Pain; Headache; Arm Pain (right shoulder pain that radiates to her neck); Manic Behavior; Nausea; and Unprotected Sex   HPI Ann Clark is a 30 year old female with a history of bipolar disorder who presents today with a  2 month history of intermittent pelvic pain, vaginal discharge which has been off white and associated itching. She has had 2 sexual partners over the last 6 months and sometimes uses condoms at other times complains of condom popping. She would also like to be checked for HIV as well as STDs. Denies dysuria, urinary frequency.  Complains of headaches which are worse when she has a ponytail; they do not have any specific pattern. Admits to being stressed but denies insomnia. Denies blurry vision, nausea, vomiting.   Also has right-sided neck pain which affects her full range of motion of the neck. She also has right shoulder pain and is scheduled to have surgery by her orthopedic- Dr. August Saucer  Past Medical History:  Diagnosis Date  . Shoulder pain     History reviewed. No pertinent surgical history.   Outpatient Medications Prior to Visit  Medication Sig Dispense Refill  . polyethylene glycol powder (MIRALAX) powder Take 255 g by mouth once. 850 g 2  . omeprazole (PRILOSEC) 40 MG capsule take 1 capsule by mouth once daily 30 capsule 2  . diphenhydramine-acetaminophen (TYLENOL PM) 25-500 MG TABS tablet Take 1-2 tablets by mouth at bedtime as needed (sleep).     . cyclobenzaprine (FLEXERIL) 10 MG tablet Take 1 tablet (10 mg total) by mouth 3 (three) times daily as needed for muscle spasms. (Patient not taking: Reported on 04/07/2016) 20 tablet 0  . fluconazole (DIFLUCAN) 150 MG tablet Take 1 tablet (150 mg total) by mouth once. (Patient not taking: Reported on 04/07/2016) 1 tablet 0  . ibuprofen (ADVIL,MOTRIN) 600 MG tablet Take 1 tablet  (600 mg total) by mouth every 6 (six) hours as needed. (Patient not taking: Reported on 04/07/2016) 30 tablet 0  . methocarbamol (ROBAXIN) 500 MG tablet Take 1 tablet (500 mg total) by mouth 2 (two) times daily as needed for muscle spasms. (Patient not taking: Reported on 04/07/2016) 20 tablet 0  . metroNIDAZOLE (FLAGYL) 500 MG tablet Take 1 tablet (500 mg total) by mouth 2 (two) times daily. (Patient not taking: Reported on 04/07/2016) 14 tablet 0  . metroNIDAZOLE (METROGEL VAGINAL) 0.75 % vaginal gel Place 1 Applicatorful vaginally at bedtime. For 5 days. (Patient not taking: Reported on 04/07/2016) 70 g 0  . naproxen (NAPROSYN) 500 MG tablet Take 1 tablet (500 mg total) by mouth 2 (two) times daily with a meal. (Patient not taking: Reported on 04/07/2016) 30 tablet 0  . ondansetron (ZOFRAN) 4 MG tablet Take 1 tablet (4 mg total) by mouth every 8 (eight) hours as needed for nausea or vomiting. (Patient not taking: Reported on 04/07/2016) 12 tablet 0  . pantoprazole (PROTONIX) 40 MG tablet Take 1 tablet (40 mg total) by mouth daily. (Patient not taking: Reported on 04/07/2016) 30 tablet 3   No facility-administered medications prior to visit.     ROS Review of Systems  Constitutional: Negative for activity change, appetite change and fatigue.  HENT: Negative for congestion, sinus pressure and sore throat.   Eyes: Negative for visual disturbance.  Respiratory: Negative for cough, chest tightness, shortness of breath and wheezing.   Cardiovascular: Negative  for chest pain and palpitations.  Gastrointestinal: Negative for abdominal distention, abdominal pain and constipation.  Endocrine: Negative for polydipsia.  Genitourinary:       See history of present illness  Musculoskeletal:       See history of present illness  Skin: Negative for rash.  Neurological: Positive for headaches. Negative for tremors, light-headedness and numbness.  Hematological: Does not bruise/bleed easily.    Psychiatric/Behavioral: Negative for agitation and behavioral problems.    Objective:  BP 105/71 (BP Location: Right Arm, Patient Position: Sitting, Cuff Size: Large)   Pulse 68   Temp 98 F (36.7 C) (Oral)   Ht 5\' 2"  (1.575 m)   Wt 165 lb 3.2 oz (74.9 kg)   SpO2 100%   BMI 30.22 kg/m   BP/Weight 04/07/2016 01/03/2016 10/25/2015  Systolic BP 105 117 106  Diastolic BP 71 88 73  Wt. (Lbs) 165.2 - 150  BMI 30.22 - 29.3      Physical Exam  Constitutional: She is oriented to person, place, and time. She appears well-developed and well-nourished.  Cardiovascular: Normal rate, normal heart sounds and intact distal pulses.   No murmur heard. Pulmonary/Chest: Effort normal and breath sounds normal. She has no wheezes. She has no rales. She exhibits no tenderness.  Abdominal: Soft. Bowel sounds are normal. She exhibits no distension and no mass. There is no tenderness.  Genitourinary:  Genitourinary Comments: Normal external genitalia Vagina: Whitish curd-like discharge.  Musculoskeletal: Normal range of motion. She exhibits tenderness (tenderness on palpation of right side of neck and on ROM of neck).  Neurological: She is alert and oriented to person, place, and time.     Assessment & Plan:   1. Vaginal discharge - Cervicovaginal ancillary only - HIV antibody (with reflex)  2. Tension-type headache, not intractable, unspecified chronicity pattern Use ibuprofen as needed Could be stress related  3. Neck pain Likely of muscular etiology - ibuprofen (ADVIL,MOTRIN) 600 MG tablet; Take 1 tablet (600 mg total) by mouth every 6 (six) hours as needed.  Dispense: 30 tablet; Refill: 2 - methocarbamol (ROBAXIN) 500 MG tablet; Take 1 tablet (500 mg total) by mouth 2 (two) times daily as needed for muscle spasms.  Dispense: 60 tablet; Refill: 1  4. High risk sexual behavior Discussed sexual precautions - POCT urine pregnancy  5. Vaginal candidiasis - fluconazole (DIFLUCAN) 150 MG  tablet; Take 1 tablet (150 mg total) by mouth once.  Dispense: 1 tablet; Refill: 0  6. Gastroesophageal reflux disease without esophagitis - omeprazole (PRILOSEC) 40 MG capsule; Take 1 capsule (40 mg total) by mouth daily.  Dispense: 30 capsule; Refill: 2   Meds ordered this encounter  Medications  . ibuprofen (ADVIL,MOTRIN) 600 MG tablet    Sig: Take 1 tablet (600 mg total) by mouth every 6 (six) hours as needed.    Dispense:  30 tablet    Refill:  2  . methocarbamol (ROBAXIN) 500 MG tablet    Sig: Take 1 tablet (500 mg total) by mouth 2 (two) times daily as needed for muscle spasms.    Dispense:  60 tablet    Refill:  1  . fluconazole (DIFLUCAN) 150 MG tablet    Sig: Take 1 tablet (150 mg total) by mouth once.    Dispense:  1 tablet    Refill:  0  . omeprazole (PRILOSEC) 40 MG capsule    Sig: Take 1 capsule (40 mg total) by mouth daily.    Dispense:  30 capsule  Refill:  2    Follow-up: Return in about 3 weeks (around 04/28/2016) for Follow-up on neck pain and headache.   Jaclyn Shaggy MD

## 2016-04-08 LAB — CERVICOVAGINAL ANCILLARY ONLY
Chlamydia: NEGATIVE
Neisseria Gonorrhea: NEGATIVE
Trichomonas: NEGATIVE

## 2016-04-08 LAB — HIV ANTIBODY (ROUTINE TESTING W REFLEX): HIV 1&2 Ab, 4th Generation: NONREACTIVE

## 2016-04-12 ENCOUNTER — Other Ambulatory Visit: Payer: Self-pay | Admitting: Family Medicine

## 2016-04-12 LAB — CERVICOVAGINAL ANCILLARY ONLY: Candida vaginitis: NEGATIVE

## 2016-04-12 MED ORDER — METRONIDAZOLE 0.75 % VA GEL: 1.0000 | Freq: Two times a day (BID) | VAGINAL | 0 refills | Status: DC

## 2016-04-15 ENCOUNTER — Telehealth: Payer: Self-pay

## 2016-04-15 NOTE — Telephone Encounter (Signed)
Writer called patient and LVM asking for a return call to discuss further lab findings.

## 2016-04-15 NOTE — Telephone Encounter (Signed)
-----   Message from Jaclyn ShaggyEnobong Amao, MD sent at 04/12/2016  2:06 PM EDT ----- Please inform her that her culture report was edited by the lab to reflect presence of BV; advise to disregard the previous message with normal vaginal cultures and I have sent a prescription for MetroGel to the pharmacy..Marland Kitchen

## 2016-04-16 ENCOUNTER — Telehealth: Payer: Self-pay | Admitting: Family Medicine

## 2016-04-16 NOTE — Telephone Encounter (Signed)
Pt's received message regarding lab result and called back. Please follow up. Pt would like nurse to call her at her temporary number [phone].

## 2016-04-19 NOTE — Telephone Encounter (Signed)
Writer LVM regarding patient's test results.  Advised patient to call back to discuss..Marland Kitchen

## 2016-04-28 NOTE — Telephone Encounter (Signed)
After many attempts to try and reach patient writer sent a letter to her regarding her lab results.

## 2016-11-23 ENCOUNTER — Emergency Department (HOSPITAL_COMMUNITY)
Admission: EM | Admit: 2016-11-23 | Discharge: 2016-11-24 | Disposition: A | Discharge status: Home / Self Care | Payer: Medicaid Other | Attending: Emergency Medicine | Admitting: Emergency Medicine

## 2016-11-23 ENCOUNTER — Encounter (HOSPITAL_COMMUNITY): Payer: Self-pay | Admitting: Emergency Medicine

## 2016-11-23 DIAGNOSIS — Y92410 Unspecified street and highway as the place of occurrence of the external cause: Secondary | ICD-10-CM | POA: Insufficient documentation

## 2016-11-23 DIAGNOSIS — M546 Pain in thoracic spine: Secondary | ICD-10-CM | POA: Diagnosis not present

## 2016-11-23 DIAGNOSIS — Y9389 Activity, other specified: Secondary | ICD-10-CM | POA: Diagnosis not present

## 2016-11-23 DIAGNOSIS — S24109A Unspecified injury at unspecified level of thoracic spinal cord, initial encounter: Secondary | ICD-10-CM | POA: Diagnosis present

## 2016-11-23 DIAGNOSIS — Z79899 Other long term (current) drug therapy: Secondary | ICD-10-CM | POA: Diagnosis not present

## 2016-11-23 DIAGNOSIS — R0789 Other chest pain: Secondary | ICD-10-CM | POA: Diagnosis not present

## 2016-11-23 DIAGNOSIS — M542 Cervicalgia: Secondary | ICD-10-CM | POA: Insufficient documentation

## 2016-11-23 DIAGNOSIS — F17210 Nicotine dependence, cigarettes, uncomplicated: Secondary | ICD-10-CM | POA: Insufficient documentation

## 2016-11-23 DIAGNOSIS — Y999 Unspecified external cause status: Secondary | ICD-10-CM | POA: Insufficient documentation

## 2016-11-23 MED ORDER — IBUPROFEN 200 MG PO TABS
600.0000 mg | ORAL_TABLET | Freq: Once | ORAL | Status: AC
  Administered 2016-11-24: 600 mg via ORAL
  Filled 2016-11-23: qty 1
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
## 2016-11-24 ENCOUNTER — Emergency Department (HOSPITAL_COMMUNITY): Payer: Medicaid Other

## 2016-11-24 MED ORDER — METHOCARBAMOL 500 MG PO TABS: 500.0000 mg | ORAL_TABLET | Freq: Every evening | ORAL | 0 refills | Status: DC | PRN

## 2016-11-24 MED ORDER — NAPROXEN 500 MG PO TABS: 500.0000 mg | ORAL_TABLET | Freq: Two times a day (BID) | ORAL | 0 refills | Status: DC

## 2016-11-24 MED ORDER — IBUPROFEN 600 MG PO TABS: 600.0000 mg | ORAL_TABLET | Freq: Four times a day (QID) | ORAL | 0 refills | Status: DC | PRN

## 2016-11-24 NOTE — ED Notes (Signed)
Pt left prior to this Charge RN being able to speak with patient and apologize for breakdown in communication amongst staff.

## 2016-11-24 NOTE — ED Notes (Signed)
Patient transported to X-ray 

## 2016-11-24 NOTE — ED Notes (Signed)
Pt stable, understands discharge instructions, and reasons for return.   
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
## 2016-11-24 NOTE — ED Notes (Signed)
Jessica, PA at bedside

## 2017-01-10 ENCOUNTER — Encounter: Payer: Self-pay | Admitting: Family Medicine

## 2017-01-10 ENCOUNTER — Other Ambulatory Visit (HOSPITAL_COMMUNITY)
Admission: RE | Admit: 2017-01-10 | Discharge: 2017-01-10 | Disposition: A | Discharge status: Home / Self Care | Payer: Medicaid Other | Source: Ambulatory Visit | Attending: Family Medicine | Admitting: Family Medicine

## 2017-01-10 ENCOUNTER — Ambulatory Visit: Payer: Medicaid Other | Attending: Family Medicine | Admitting: Family Medicine

## 2017-01-10 VITALS — BP 122/84 | HR 85 | Temp 98.2°F | Resp 16 | Ht 62.0 in | Wt 162.6 lb

## 2017-01-10 DIAGNOSIS — K219 Gastro-esophageal reflux disease without esophagitis: Secondary | ICD-10-CM | POA: Insufficient documentation

## 2017-01-10 DIAGNOSIS — Z13228 Encounter for screening for other metabolic disorders: Secondary | ICD-10-CM | POA: Diagnosis not present

## 2017-01-10 DIAGNOSIS — Z0000 Encounter for general adult medical examination without abnormal findings: Secondary | ICD-10-CM | POA: Insufficient documentation

## 2017-01-10 DIAGNOSIS — M791 Myalgia, unspecified site: Secondary | ICD-10-CM

## 2017-01-10 DIAGNOSIS — Z124 Encounter for screening for malignant neoplasm of cervix: Secondary | ICD-10-CM | POA: Insufficient documentation

## 2017-01-10 DIAGNOSIS — Z113 Encounter for screening for infections with a predominantly sexual mode of transmission: Secondary | ICD-10-CM | POA: Insufficient documentation

## 2017-01-10 MED ORDER — IBUPROFEN 600 MG PO TABS: 600.0000 mg | ORAL_TABLET | Freq: Three times a day (TID) | ORAL | 2 refills | Status: DC | PRN

## 2017-01-10 MED ORDER — POLYETHYLENE GLYCOL 3350 17 GM/SCOOP PO POWD: 1.0000 | Freq: Once | ORAL | 2 refills | Status: AC

## 2017-01-10 MED ORDER — OMEPRAZOLE 40 MG PO CPDR: 40.0000 mg | DELAYED_RELEASE_CAPSULE | Freq: Every day | ORAL | 2 refills | Status: DC

## 2017-01-10 MED ORDER — METHOCARBAMOL 500 MG PO TABS: 500.0000 mg | ORAL_TABLET | Freq: Every evening | ORAL | 2 refills | Status: DC | PRN

## 2017-01-10 NOTE — Patient Instructions (Signed)
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
## 2017-01-12 LAB — CYTOLOGY - PAP
Candida vaginitis: NEGATIVE
Chlamydia: NEGATIVE
DIAGNOSIS: NEGATIVE
HPV: NOT DETECTED
Neisseria Gonorrhea: NEGATIVE
Trichomonas: POSITIVE — AB

## 2017-01-12 LAB — CMP14+EGFR
ALBUMIN: 4.4 g/dL (ref 3.5–5.5)
ALK PHOS: 53 IU/L (ref 39–117)
ALT: 16 IU/L (ref 0–32)
AST: 17 IU/L (ref 0–40)
Albumin/Globulin Ratio: 1.6 (ref 1.2–2.2)
BUN / CREAT RATIO: 11 (ref 9–23)
BUN: 9 mg/dL (ref 6–20)
Bilirubin Total: <0.2 mg/dL (ref 0.0–1.2)
CO2: 20 mmol/L (ref 20–29)
CREATININE: 0.82 mg/dL (ref 0.57–1.00)
Calcium: 9.3 mg/dL (ref 8.7–10.2)
Chloride: 101 mmol/L (ref 96–106)
GFR calc Af Amer: 109 mL/min/{1.73_m2} (ref >59)
GFR calc non Af Amer: 95 mL/min/{1.73_m2} (ref >59)
GLUCOSE: 67 mg/dL (ref 65–99)
Globulin, Total: 2.8 g/dL (ref 1.5–4.5)
Potassium: 4.4 mmol/L (ref 3.5–5.2)
Sodium: 137 mmol/L (ref 134–144)
TOTAL PROTEIN: 7.2 g/dL (ref 6.0–8.5)

## 2017-01-12 LAB — LIPID PANEL
CHOLESTEROL TOTAL: 158 mg/dL (ref 100–199)
Chol/HDL Ratio: 2.9 ratio (ref 0.0–4.4)
HDL: 54 mg/dL (ref >39)
LDL CALC: 92 mg/dL (ref 0–99)
Triglycerides: 62 mg/dL (ref 0–149)
VLDL CHOLESTEROL CAL: 12 mg/dL (ref 5–40)

## 2017-01-12 LAB — RPR: RPR Ser Ql: NONREACTIVE

## 2017-01-12 LAB — HIV ANTIBODY (ROUTINE TESTING W REFLEX): HIV Screen 4th Generation wRfx: NONREACTIVE

## 2017-01-14 ENCOUNTER — Other Ambulatory Visit: Payer: Self-pay | Admitting: Family Medicine

## 2017-01-14 ENCOUNTER — Telehealth: Payer: Self-pay | Admitting: *Deleted

## 2017-01-14 LAB — CERVICOVAGINAL ANCILLARY ONLY: HERPES (WINDOWPATH): NEGATIVE

## 2017-01-14 MED ORDER — METRONIDAZOLE 500 MG PO TABS: 2000.0000 mg | ORAL_TABLET | Freq: Once | ORAL | 0 refills | Status: AC

## 2017-01-24 ENCOUNTER — Other Ambulatory Visit: Payer: Medicaid Other

## 2017-02-21 NOTE — Telephone Encounter (Signed)
No information provided

## 2018-02-28 ENCOUNTER — Encounter: Payer: Self-pay | Admitting: Family Medicine

## 2018-03-21 ENCOUNTER — Encounter (HOSPITAL_COMMUNITY): Payer: Self-pay | Admitting: Emergency Medicine

## 2018-03-21 ENCOUNTER — Emergency Department (HOSPITAL_COMMUNITY)
Admission: EM | Admit: 2018-03-21 | Discharge: 2018-03-21 | Disposition: A | Discharge status: Home / Self Care | Payer: Self-pay | Attending: Emergency Medicine | Admitting: Emergency Medicine

## 2018-03-21 DIAGNOSIS — R109 Unspecified abdominal pain: Secondary | ICD-10-CM | POA: Insufficient documentation

## 2018-03-21 DIAGNOSIS — Z5321 Procedure and treatment not carried out due to patient leaving prior to being seen by health care provider: Secondary | ICD-10-CM | POA: Insufficient documentation

## 2018-03-21 LAB — CBC
HCT: 37.6 % (ref 36.0–46.0)
HEMOGLOBIN: 11.7 g/dL — AB (ref 12.0–15.0)
MCH: 23.9 pg — AB (ref 26.0–34.0)
MCHC: 31.1 g/dL (ref 30.0–36.0)
MCV: 76.7 fL — ABNORMAL LOW (ref 78.0–100.0)
Platelets: 199 10*3/uL (ref 150–400)
RBC: 4.9 MIL/uL (ref 3.87–5.11)
RDW: 14.1 % (ref 11.5–15.5)
WBC: 15.1 10*3/uL — AB (ref 4.0–10.5)

## 2018-03-21 LAB — COMPREHENSIVE METABOLIC PANEL
ALK PHOS: 56 U/L (ref 38–126)
ALT: 13 U/L (ref 0–44)
ANION GAP: 9 (ref 5–15)
AST: 19 U/L (ref 15–41)
Albumin: 4 g/dL (ref 3.5–5.0)
BUN: 14 mg/dL (ref 6–20)
CALCIUM: 8.8 mg/dL — AB (ref 8.9–10.3)
CO2: 22 mmol/L (ref 22–32)
Chloride: 103 mmol/L (ref 98–111)
Creatinine, Ser: 0.89 mg/dL (ref 0.44–1.00)
Glucose, Bld: 64 mg/dL — ABNORMAL LOW (ref 70–99)
Potassium: 3.9 mmol/L (ref 3.5–5.1)
Sodium: 134 mmol/L — ABNORMAL LOW (ref 135–145)
TOTAL PROTEIN: 7.2 g/dL (ref 6.5–8.1)
Total Bilirubin: 0.7 mg/dL (ref 0.3–1.2)

## 2018-03-21 LAB — URINALYSIS, ROUTINE W REFLEX MICROSCOPIC
BILIRUBIN URINE: NEGATIVE
Bacteria, UA: NONE SEEN
GLUCOSE, UA: NEGATIVE mg/dL
KETONES UR: 80 mg/dL — AB
LEUKOCYTES UA: NEGATIVE
NITRITE: NEGATIVE
PH: 5 (ref 5.0–8.0)
PROTEIN: NEGATIVE mg/dL
Specific Gravity, Urine: 1.021 (ref 1.005–1.030)

## 2018-03-21 LAB — I-STAT BETA HCG BLOOD, ED (MC, WL, AP ONLY): I-stat hCG, quantitative: <5 m[IU]/mL (ref <5)

## 2018-03-21 LAB — LIPASE, BLOOD: Lipase: 22 U/L (ref 11–51)

## 2018-03-21 NOTE — ED Notes (Addendum)
Pt asked about wait time and informed. Pt decided to leave. Informed Katherina RightKevin - Charge RN. Pt will be moved OTF.

## 2018-03-21 NOTE — ED Triage Notes (Signed)
Pt reports lower abd pain and cramping like menstrual cramps but without her period. LMP was 2 weeks ago. Pt also reports abscess in groin for the last 2 days.

## 2018-03-24 ENCOUNTER — Emergency Department (HOSPITAL_COMMUNITY)
Admission: EM | Admit: 2018-03-24 | Discharge: 2018-03-24 | Disposition: A | Discharge status: Home / Self Care | Payer: Self-pay | Attending: Emergency Medicine | Admitting: Emergency Medicine

## 2018-03-24 ENCOUNTER — Other Ambulatory Visit: Payer: Self-pay

## 2018-03-24 ENCOUNTER — Encounter (HOSPITAL_COMMUNITY): Payer: Self-pay | Admitting: *Deleted

## 2018-03-24 DIAGNOSIS — L0291 Cutaneous abscess, unspecified: Secondary | ICD-10-CM

## 2018-03-24 DIAGNOSIS — N764 Abscess of vulva: Secondary | ICD-10-CM | POA: Insufficient documentation

## 2018-03-24 MED ORDER — LIDOCAINE-EPINEPHRINE-TETRACAINE (LET) SOLUTION
3.0000 mL | Freq: Once | NASAL | Status: AC
  Administered 2018-03-24: 14:00:00 3 mL via TOPICAL
  Filled 2018-03-24: qty 3

## 2018-03-24 MED ORDER — LIDOCAINE HCL (PF) 1 % IJ SOLN
30.0000 mL | Freq: Once | INTRAMUSCULAR | Status: AC
  Administered 2018-03-24: 30 mL
  Filled 2018-03-24: qty 30

## 2018-03-24 MED ORDER — SULFAMETHOXAZOLE-TRIMETHOPRIM 800-160 MG PO TABS: 1.0000 | ORAL_TABLET | Freq: Two times a day (BID) | ORAL | 0 refills | Status: AC

## 2018-03-24 MED ORDER — NAPROXEN 500 MG PO TABS: 500.0000 mg | ORAL_TABLET | Freq: Two times a day (BID) | ORAL | 0 refills | Status: DC

## 2018-03-24 NOTE — ED Provider Notes (Signed)
[facility] Emergency Department Provider Note MRN:  [id number]  Arrival date & time: 03/24/18     Chief Complaint   Abscess   History of Present Illness   Ann Clark is a 32 y.o. year-old female with no pertinent past medical history presenting to the ED with chief complaint of abscess.  For the past 3 to 4 days, patient has been experiencing a small area of pain, swelling, redness to her right labia, progressively worsening.  Pain is a dull ache, 7 out of 10 in severity.  Pain is nonradiating.  Pain is constant.  No exacerbating or relieving factors.  Patient has tried warm compresses, with no help.  Denies fevers, no headache or vision change, no chest pain or shortness of breath, endorsing mild lower abdominal cramping today.  No vaginal bleeding or discharge.  Review of Systems  A complete 10 system review of systems was obtained and all systems are negative except as noted in the HPI and PMH.   Patient's Health History    Past Medical History:  Diagnosis Date  . Shoulder pain     Past Surgical History:  Procedure Laterality Date  . EUSTACHIAN TUBE DILATION    . TONSILLECTOMY      Family History  Problem Relation Age of Onset  . COPD Mother   . Diabetes Mother   . Hyperlipidemia Mother   . Hypertension Mother   . Kidney disease Father     Social History   Socioeconomic History  . Marital status: Single    Spouse name: Not on file  . Number of children: Not on file  . Years of education: Not on file  . Highest education level: Not on file  Occupational History  . Not on file  Social Needs  . Financial resource strain: Not on file  . Food insecurity:    Worry: Not on file    Inability: Not on file  . Transportation needs:    Medical: Not on file    Non-medical: Not on file  Tobacco Use  . Smoking status: Current Every Day Smoker    Packs/day: 0.25    Types: Cigarettes  . Smokeless tobacco: Never Used  . Tobacco comment: occasional    Substance and Sexual Activity  . Alcohol use: Yes    Alcohol/week: 2.0 - 3.0 standard drinks    Types: 2 - 3 Shots of liquor per week    Comment: two days weekly  . Drug use: Yes    Types: Marijuana    Comment: 4 times monthly  . Sexual activity: Not on file  Lifestyle  . Physical activity:    Days per week: Not on file    Minutes per session: Not on file  . Stress: Not on file  Relationships  . Social connections:    Talks on phone: Not on file    Gets together: Not on file    Attends religious service: Not on file    Active member of club or organization: Not on file    Attends meetings of clubs or organizations: Not on file    Relationship status: Not on file  . Intimate partner violence:    Fear of current or ex partner: Not on file    Emotionally abused: Not on file    Physically abused: Not on file    Forced sexual activity: Not on file  Other Topics Concern  . Not on file  Social History Narrative  . Not on  file     Physical Exam  Vital Signs and Nursing Notes reviewed Vitals:   03/24/18 1300 03/24/18 1315  BP: 117/70 107/88  Pulse: (!) 59 65  Resp:  16  Temp:    SpO2: 97% 99%    CONSTITUTIONAL: Well-appearing, NAD NEURO:  Alert and oriented x 3, no focal deficits EYES:  eyes equal and reactive ENT/NECK:  no LAD, no JVD CARDIO: Regular rate, well-perfused, normal S1 and S2 PULM:  CTAB no wheezing or rhonchi GI/GU:  normal bowel sounds, non-distended, non-tender, middle and posterior aspect of the right labia has a tender 5 cm round area of fluctuance MSK/SPINE:  No gross deformities, no edema SKIN:  no rash, atraumatic PSYCH:  Appropriate speech and behavior  Diagnostic and Interventional Summary    EKG Interpretation  Date/Time:    Ventricular Rate:    PR Interval:    QRS Duration:   QT Interval:    QTC Calculation:   R Axis:     Text Interpretation:        Labs Reviewed - No data to display  No orders to display    Medications   lidocaine-EPINEPHrine-tetracaine (LET) solution (has no administration in time range)  lidocaine (PF) (XYLOCAINE) 1 % injection 30 mL (has no administration in time range)     .Marland KitchenIncision and Drainage Date/Time: 03/24/2018 3:01 PM Performed by: Sabas Sous, MD Authorized by: Sabas Sous, MD   Consent:    Consent obtained:  Verbal   Consent given by:  Patient   Risks discussed:  Incomplete drainage and bleeding Location:    Type:  Abscess (Labial)   Size:  5cm   Location:  Anogenital   Anogenital location:  Vulva Pre-procedure details:    Skin preparation:  Betadine Anesthesia (see MAR for exact dosages):    Anesthesia method:  Local infiltration   Local anesthetic:  Lidocaine 1% w/o epi Procedure type:    Complexity:  Simple Procedure details:    Incision types:  Stab incision   Incision depth:  Submucosal   Scalpel blade:  11   Wound management:  Probed and deloculated and irrigated with saline   Drainage:  Purulent   Drainage amount:  Moderate   Wound treatment:  Wound left open   Packing materials:  1/4 in iodoform gauze   Amount 1/4" iodoform:  5cm Post-procedure details:    Patient tolerance of procedure:  Tolerated well, no immediate complications     Critical Care  ED Course and Medical Decision Making  I have reviewed the triage vital signs and the nursing notes.  Pertinent labs & imaging results that were available during my care of the patient were reviewed by me and considered in my medical decision making (see below for details).  Question of vaginal/labial abscess versus Bartholin abscess, genitourinary exam with chaperone pending.  Will likely I&D, place Word catheter if needed.  Abdominal pain is largely on concerning today, had labs performed 2 days ago which were unremarkable, completely benign and nontender exam here today.  Abscess drained as described above, prescription for Bactrim, will follow-up with GYN for repeat exam in 2  days.  After the discussed management above, the patient was determined to be safe for discharge.  The patient was in agreement with this plan and all questions regarding their care were answered.  ED return precautions were discussed and the patient will return to the ED with any significant worsening of condition.   Elmer Sow. Pilar Plate, MD [facility]  Emergency Medicine [facility] [email or web address]  Final Clinical Impressions(s) / ED Diagnoses     ICD-10-CM   1. Abscess L02.91   2. Labial abscess N76.4     ED Discharge Orders         Ordered    sulfamethoxazole-trimethoprim (BACTRIM DS,SEPTRA DS) 800-160 MG tablet  2 times daily     03/24/18 1456    naproxen (NAPROSYN) 500 MG tablet  2 times daily     03/24/18 1456             Sabas Sous, MD 03/24/18 1504

## 2018-03-24 NOTE — ED Triage Notes (Signed)
Pt in c/o abscess to her vagina for the last few days, came in a few days ago but was unable to stay to be seen

## 2018-03-24 NOTE — Discharge Instructions (Addendum)
You were evaluated in the Emergency Department and after careful evaluation, we did not find any emergent condition requiring admission or further testing in the hospital.  Your symptoms today seem to be due to a labial abscess, which we were able to drain today in the emergency department.  Please use the antibiotics provided as directed.  Given that this is your second labial abscess, it is important that you follow-up with your GYN doctors.  We recommend a recheck of the wound in 2 days, at that time your packing material can be removed.  Please return to the Emergency Department if you experience any worsening of your condition.  We encourage you to follow up with a primary care provider.  Thank you for allowing [place] to be a part of your care.

## 2019-02-28 ENCOUNTER — Ambulatory Visit (HOSPITAL_COMMUNITY)
Admission: EM | Admit: 2019-02-28 | Discharge: 2019-02-28 | Disposition: A | Discharge status: Home / Self Care | Payer: Self-pay

## 2019-02-28 ENCOUNTER — Encounter (HOSPITAL_COMMUNITY): Payer: Self-pay | Admitting: Emergency Medicine

## 2019-02-28 ENCOUNTER — Emergency Department (HOSPITAL_COMMUNITY)
Admission: EM | Admit: 2019-02-28 | Discharge: 2019-02-28 | Discharge status: Against Medical Advice | Payer: Medicaid Other | Attending: Emergency Medicine | Admitting: Emergency Medicine

## 2019-02-28 ENCOUNTER — Emergency Department (HOSPITAL_COMMUNITY): Payer: Medicaid Other

## 2019-02-28 DIAGNOSIS — R0789 Other chest pain: Secondary | ICD-10-CM | POA: Diagnosis present

## 2019-02-28 DIAGNOSIS — Z5321 Procedure and treatment not carried out due to patient leaving prior to being seen by health care provider: Secondary | ICD-10-CM | POA: Insufficient documentation

## 2019-02-28 LAB — BASIC METABOLIC PANEL
Anion gap: 13 (ref 5–15)
BUN: 8 mg/dL (ref 6–20)
CO2: 21 mmol/L — ABNORMAL LOW (ref 22–32)
Calcium: 8.9 mg/dL (ref 8.9–10.3)
Chloride: 101 mmol/L (ref 98–111)
Creatinine, Ser: 0.83 mg/dL (ref 0.44–1.00)
GFR calc Af Amer: >60 mL/min (ref >60)
GFR calc non Af Amer: >60 mL/min (ref >60)
Glucose, Bld: 83 mg/dL (ref 70–99)
Potassium: 3.7 mmol/L (ref 3.5–5.1)
Sodium: 135 mmol/L (ref 135–145)

## 2019-02-28 LAB — I-STAT BETA HCG BLOOD, ED (MC, WL, AP ONLY): I-stat hCG, quantitative: <5 m[IU]/mL (ref <5)

## 2019-02-28 LAB — CBC
HCT: 36.4 % (ref 36.0–46.0)
Hemoglobin: 11.8 g/dL — ABNORMAL LOW (ref 12.0–15.0)
MCH: 24.2 pg — ABNORMAL LOW (ref 26.0–34.0)
MCHC: 32.4 g/dL (ref 30.0–36.0)
MCV: 74.6 fL — ABNORMAL LOW (ref 80.0–100.0)
Platelets: 267 10*3/uL (ref 150–400)
RBC: 4.88 MIL/uL (ref 3.87–5.11)
RDW: 14.5 % (ref 11.5–15.5)
WBC: 6.1 10*3/uL (ref 4.0–10.5)
nRBC: 0 % (ref 0.0–0.2)

## 2019-02-28 LAB — TROPONIN I (HIGH SENSITIVITY): Troponin I (High Sensitivity): 4 ng/L (ref <18)

## 2019-02-28 MED ORDER — SODIUM CHLORIDE 0.9% FLUSH: 3.0000 mL | Freq: Once | INTRAVENOUS | Status: DC

## 2019-02-28 MED ORDER — ACETAMINOPHEN 325 MG PO TABS
650.0000 mg | ORAL_TABLET | Freq: Once | ORAL | Status: AC
  Administered 2019-02-28: 650 mg via ORAL
  Filled 2019-02-28: qty 2

## 2019-02-28 NOTE — ED Notes (Signed)
No answer  1X

## 2019-02-28 NOTE — ED Notes (Signed)
Patient did not go to the ED as stated.  Patient is seen sitting in parking lot in her vehicle.

## 2019-02-28 NOTE — ED Notes (Signed)
Patient is being discharged from the [facility] and sent to the Emergency Department via wheelchair by staff. Per Dr. Mannie Stabile, patient is stable but in need of higher level of care due to severe chest pain and elevated troponin readings, requiring additional testing and monitoring not able to be performed at the [facility]. Patient is aware and verbalizes understanding of plan of care. There were no vitals filed for this visit.

## 2019-02-28 NOTE — ED Notes (Signed)
PT left

## 2019-02-28 NOTE — ED Triage Notes (Addendum)
Pt arrives to ED with c/o of mid sternal cp tightness and pressure denies any sob, fever , chills or cough. Pt states this has been happening on and off for 1 week.

## 2019-02-28 NOTE — ED Notes (Signed)
Pt. Stated, could have something for pain, Donnald Garre been here awhile.  Given Tylenol 650mg . po

## 2019-03-01 ENCOUNTER — Emergency Department (HOSPITAL_COMMUNITY)
Admission: EM | Admit: 2019-03-01 | Discharge: 2019-03-01 | Disposition: A | Discharge status: Home / Self Care | Payer: Medicaid Other | Attending: Emergency Medicine | Admitting: Emergency Medicine

## 2019-03-01 ENCOUNTER — Other Ambulatory Visit: Payer: Self-pay

## 2019-03-01 ENCOUNTER — Encounter (HOSPITAL_COMMUNITY): Payer: Self-pay

## 2019-03-01 DIAGNOSIS — R079 Chest pain, unspecified: Secondary | ICD-10-CM

## 2019-03-01 DIAGNOSIS — R0789 Other chest pain: Secondary | ICD-10-CM | POA: Diagnosis not present

## 2019-03-01 DIAGNOSIS — F17210 Nicotine dependence, cigarettes, uncomplicated: Secondary | ICD-10-CM | POA: Insufficient documentation

## 2019-03-01 LAB — BASIC METABOLIC PANEL
Anion gap: 7 (ref 5–15)
BUN: 10 mg/dL (ref 6–20)
CO2: 24 mmol/L (ref 22–32)
Calcium: 8.9 mg/dL (ref 8.9–10.3)
Chloride: 104 mmol/L (ref 98–111)
Creatinine, Ser: 0.9 mg/dL (ref 0.44–1.00)
GFR calc Af Amer: >60 mL/min (ref >60)
GFR calc non Af Amer: >60 mL/min (ref >60)
Glucose, Bld: 93 mg/dL (ref 70–99)
Potassium: 3.4 mmol/L — ABNORMAL LOW (ref 3.5–5.1)
Sodium: 135 mmol/L (ref 135–145)

## 2019-03-01 LAB — TROPONIN I (HIGH SENSITIVITY)
Troponin I (High Sensitivity): <2 ng/L (ref <18)
Troponin I (High Sensitivity): <2 ng/L (ref <18)

## 2019-03-01 LAB — I-STAT BETA HCG BLOOD, ED (MC, WL, AP ONLY): I-stat hCG, quantitative: <5 m[IU]/mL (ref <5)

## 2019-03-01 LAB — CBC
HCT: 37.1 % (ref 36.0–46.0)
Hemoglobin: 11.9 g/dL — ABNORMAL LOW (ref 12.0–15.0)
MCH: 24.5 pg — ABNORMAL LOW (ref 26.0–34.0)
MCHC: 32.1 g/dL (ref 30.0–36.0)
MCV: 76.5 fL — ABNORMAL LOW (ref 80.0–100.0)
Platelets: 257 10*3/uL (ref 150–400)
RBC: 4.85 MIL/uL (ref 3.87–5.11)
RDW: 14.7 % (ref 11.5–15.5)
WBC: 6.6 10*3/uL (ref 4.0–10.5)
nRBC: 0 % (ref 0.0–0.2)

## 2019-03-01 MED ORDER — HYDROXYZINE HCL 25 MG PO TABS: 25.0000 mg | ORAL_TABLET | Freq: Four times a day (QID) | ORAL | 0 refills | Status: DC | PRN

## 2019-03-01 MED ORDER — SODIUM CHLORIDE 0.9% FLUSH: 3.0000 mL | Freq: Once | INTRAVENOUS | Status: DC

## 2019-03-01 MED ORDER — POTASSIUM CHLORIDE CRYS ER 20 MEQ PO TBCR
40.0000 meq | EXTENDED_RELEASE_TABLET | Freq: Once | ORAL | Status: AC
  Administered 2019-03-01: 40 meq via ORAL
  Filled 2019-03-01: qty 2

## 2019-03-01 MED ORDER — NAPROXEN 500 MG PO TABS
500.0000 mg | ORAL_TABLET | Freq: Once | ORAL | Status: AC
  Administered 2019-03-01: 500 mg via ORAL
  Filled 2019-03-01: qty 1

## 2019-03-01 MED ORDER — HYDROXYZINE HCL 10 MG PO TABS
10.0000 mg | ORAL_TABLET | Freq: Once | ORAL | Status: AC
  Administered 2019-03-01: 21:00:00 10 mg via ORAL
  Filled 2019-03-01: qty 1

## 2019-03-01 NOTE — Discharge Instructions (Signed)
You were seen in the emergency department today for chest pain. Your work-up in the emergency department has been overall reassuring. Your labs have been fairly normal and or similar to previous blood work you have had done.  Your potassium was a bit low, we gave you a supplement in the ER, please follow attached diet guidelines.  Your hemoglobin/hematocrit were also a bit low consistent with anemia which you have had with your prior blood work.  Your EKG and the enzyme we use to check your heart did not show an acute heart attack at this time. Your chest x-ray was normal.  We would like you to try taking hydroxyzine every 6 hours as needed for stress/chest pain to see if this helps at all.  We have prescribed you new medication(s) today. Discuss the medications prescribed today with your pharmacist as they can have adverse effects and interactions with your other medicines including over the counter and prescribed medications. Seek medical evaluation if you start to experience new or abnormal symptoms after taking one of these medicines, seek care immediately if you start to experience difficulty breathing, feeling of your throat closing, facial swelling, or rash as these could be indications of a more serious allergic reaction   We would like you to follow up closely with your primary care provider and/or the cardiologist provided in your discharge instructions within 1-3 days. Return to the ER immediately should you experience any new or worsening symptoms including but not limited to return of pain, worsened pain, vomiting, shortness of breath, dizziness, lightheadedness, passing out, or any other concerns that you may have.

## 2019-03-01 NOTE — ED Triage Notes (Signed)
Patient c/o  Mid chest pain x 2 weeks. patient denies any SOB, N/v , or diaphoresis. Patient was seen at [facility] ED yesterday,but left yesterday due to wait.

## 2019-03-01 NOTE — ED Provider Notes (Signed)
[facility]-EMERGENCY DEPT Provider Note   CSN: [id number] Arrival date & time: 03/01/19  1350     History   Chief Complaint Chief Complaint  Patient presents with  . Chest Pain    HPI Ann Clark is a 33 y.o. female with a hx of tobacco abuse, GERD, & bipolar disorder who presents to the ED w/ complaints of chest pain intermittently for the past 2-3 weeks. Patient states pain is in the center of her chest, it feels like an ache/tightness but is sharp at times. Pain occurs intermittently, approximately 2-3 times per day, lasts about an hour each time. Pain does not seem to have any triggers or alleviating/aggravating factors. She tries to take deep breaths and calm herself down but this does not seem to help much. Pain does not change with eating, deep breath, or exertion. Her father passed away a few months ago and he had hx of heart disease (does not believe MI prior to age 76), therefore she wanted to get her heart checked out. Denies fever, chills, URI sxs, cough, dyspnea,  leg pain/swelling, hemoptysis, recent surgery/trauma, recent long travel, hormone use, personal hx of cancer, or hx of DVT/PE.   HPI  Past Medical History:  Diagnosis Date  . Shoulder pain     Patient Active Problem List   Diagnosis Date Noted  . Trichomonas infection 08/01/2015  . Myalgia and myositis 07/30/2015  . GERD (gastroesophageal reflux disease) 07/30/2015  . Arthralgia 07/16/2015  . Bipolar I disorder (HCC) 07/16/2015    Past Surgical History:  Procedure Laterality Date  . EUSTACHIAN TUBE DILATION    . TONSILLECTOMY       OB History   No obstetric history on file.      Home Medications    Prior to Admission medications   Medication Sig Start Date End Date Taking? Authorizing Provider  diphenhydramine-acetaminophen (TYLENOL PM) 25-500 MG TABS tablet Take 1-2 tablets by mouth at bedtime as needed (sleep).    Yes [provider]  ibuprofen (ADVIL,MOTRIN)  600 MG tablet Take 1 tablet (600 mg total) by mouth every 8 (eight) hours as needed. Patient not taking: Reported on 03/01/2019 01/10/17   Hoy Register, MD  methocarbamol (ROBAXIN) 500 MG tablet Take 1 tablet (500 mg total) by mouth at bedtime as needed for muscle spasms. Patient not taking: Reported on 03/01/2019 01/10/17   Hoy Register, MD  naproxen (NAPROSYN) 500 MG tablet Take 1 tablet (500 mg total) by mouth 2 (two) times daily. Patient not taking: Reported on 03/01/2019 03/24/18   Sabas Sous, MD  omeprazole (PRILOSEC) 40 MG capsule Take 1 capsule (40 mg total) by mouth daily. Patient not taking: Reported on 03/01/2019 01/10/17   Hoy Register, MD    Family History Family History  Problem Relation Age of Onset  . COPD Mother   . Diabetes Mother   . Hyperlipidemia Mother   . Hypertension Mother   . Kidney disease Father     Social History Social History   Tobacco Use  . Smoking status: Current Every Day Smoker    Packs/day: 0.25    Types: Cigarettes  . Smokeless tobacco: Never Used  . Tobacco comment: occasional  Substance Use Topics  . Alcohol use: Yes    Alcohol/week: 2.0 - 3.0 standard drinks    Types: 2 - 3 Shots of liquor per week    Comment: two days weekly  . Drug use: Yes    Types: Marijuana  Comment: 4 times monthly     Allergies   No known allergies   Review of Systems Review of Systems  Constitutional: Negative for chills and fever.  HENT: Negative for congestion, ear pain and sore throat.   Respiratory: Negative for cough and shortness of breath.   Cardiovascular: Positive for chest pain. Negative for palpitations and leg swelling.  Gastrointestinal: Negative for abdominal pain, anal bleeding, diarrhea, nausea and vomiting.  Genitourinary: Negative for dysuria.  Musculoskeletal: Negative for myalgias.  Neurological: Negative for syncope.  Psychiatric/Behavioral: Negative for suicidal ideas.  All other systems reviewed and are negative.     Physical Exam Updated Vital Signs BP 110/79   Pulse 64   Temp 98.3 F (36.8 C) (Oral)   Resp 16   Ht 5\' 1"  (1.549 m)   Wt 72.6 kg   LMP 02/07/2019   SpO2 96%   BMI 30.23 kg/m   Physical Exam Vitals signs and nursing note reviewed.  Constitutional:      General: She is not in acute distress.    Appearance: She is well-developed. She is not toxic-appearing.  HENT:     Head: Normocephalic and atraumatic.  Eyes:     General:        Right eye: No discharge.        Left eye: No discharge.     Conjunctiva/sclera: Conjunctivae normal.  Neck:     Musculoskeletal: Neck supple.  Cardiovascular:     Rate and Rhythm: Normal rate and regular rhythm.     Pulses:          Radial pulses are 2+ on the right side and 2+ on the left side.  Pulmonary:     Effort: Pulmonary effort is normal. No respiratory distress.     Breath sounds: Normal breath sounds. No decreased breath sounds, wheezing, rhonchi or rales.  Chest:     Chest wall: No deformity, tenderness or crepitus.  Abdominal:     General: There is no distension.     Palpations: Abdomen is soft.     Tenderness: There is no abdominal tenderness.  Musculoskeletal:     Right lower leg: She exhibits no tenderness. No edema.     Left lower leg: She exhibits no tenderness. No edema.  Skin:    General: Skin is warm and dry.     Findings: No rash.  Neurological:     Mental Status: She is alert.     Comments: Clear speech.   Psychiatric:        Behavior: Behavior normal.    ED Treatments / Results  Labs (all labs ordered are listed, but only abnormal results are displayed) Labs Reviewed  BASIC METABOLIC PANEL - Abnormal; Notable for the following components:      Result Value   Potassium 3.4 (*)    All other components within normal limits  CBC - Abnormal; Notable for the following components:   Hemoglobin 11.9 (*)    MCV 76.5 (*)    MCH 24.5 (*)    All other components within normal limits  I-STAT BETA HCG BLOOD, ED ([facility],  [facility], [facility] ONLY)  TROPONIN I (HIGH SENSITIVITY)  TROPONIN I (HIGH SENSITIVITY)    EKG EKG Interpretation  Date/Time:  Thursday March 01 2019 14:09:45 EDT Ventricular Rate:  63 PR Interval:    QRS Duration: 91 QT Interval:  392 QTC Calculation: 402 R Axis:   81 Text Interpretation:  Sinus rhythm Borderline T abnormalities, anterior leads No significant change since  last tracing Confirmed by Gwyneth SproutPlunkett, Whitney ([id number]) on 03/01/2019 7:33:11 PM   Radiology Dg Chest 2 View  Result Date: 02/28/2019 CLINICAL DATA:  Chest pain for 2 weeks EXAM: CHEST - 2 VIEW COMPARISON:  11/24/2016 FINDINGS: The heart size and mediastinal contours are within normal limits. Both lungs are clear. The visualized skeletal structures are unremarkable. IMPRESSION: No active cardiopulmonary disease. Electronically Signed   By: Alcide CleverMark  Lukens M.D.   On: 02/28/2019 15:14    Procedures Procedures (including critical care time)  Medications Ordered in ED Medications  sodium chloride flush (NS) 0.9 % injection 3 mL (has no administration in time range)     Initial Impression / Assessment and Plan / ED Course  I have reviewed the triage vital signs and the nursing notes.  Pertinent labs & imaging results that were available during my care of the patient were reviewed by me and considered in my medical decision making (see chart for details).    Patient presents to the emergency department with chest pain. Patient nontoxic appearing, in no apparent distress, initial vitals without significant abnormality. Fairly benign physical exam. DDX: ACS, pulmonary embolism, dissection, pneumothorax, effusion, infiltrate, arrhythmia, anemia, electrolyte derangement, MSK, GERD, anxiety.  Work-up in the ER reviewed:  CBC: Anemia similar to prior- not critical, no leukocytosis.  BMP: Mild hypokalemia- oral replacement in the ED, diet recommendations. No significant electrolyte derangement. Renal function preserved.  Troponin: < 2 x  2 EKG: No significant change from prior, no STEMI.  CXR: Negative, without infiltrate, effusion, pneumothorax, or fracture/dislocation.   Heart pathway score 2, EKG without significant change from prior, delta troponin negative, doubt ACS. Patient is low risk wells, PERC negative, doubt pulmonary embolism. Pain is not a tearing sensation, symmetric pulses, no widening of mediastinum on CXR, doubt dissection. Cardiac monitor reviewed, no notable arrhythmias or tachycardia. Patient has appeared hemodynamically stable throughout ER visit and appears safe for discharge with close PCP/cardiology follow up. Query stress/anxiety with recent death of loved one, trial of atarax in the ED with minimal improvement- will trial this outpatient. I discussed results, treatment plan, need for PCP follow-up, and return precautions with the patient. Provided opportunity for questions, patient confirmed understanding and is in agreement with plan.    Final Clinical Impressions(s) / ED Diagnoses   Final diagnoses:  Chest pain, unspecified type    ED Discharge Orders         Ordered    hydrOXYzine (ATARAX/VISTARIL) 25 MG tablet  Every 6 hours PRN     03/01/19 2127           Cherly Andersonetrucelli, [place] Derusha R, PA-C 03/01/19 2128    Gwyneth SproutPlunkett, Whitney, MD 03/05/19 1009

## 2019-04-30 ENCOUNTER — Ambulatory Visit: Payer: Self-pay | Admitting: Nurse Practitioner

## 2019-05-08 ENCOUNTER — Ambulatory Visit: Payer: Self-pay | Admitting: Nurse Practitioner

## 2019-05-23 ENCOUNTER — Other Ambulatory Visit: Payer: Self-pay

## 2019-05-23 ENCOUNTER — Ambulatory Visit (INDEPENDENT_AMBULATORY_CARE_PROVIDER_SITE_OTHER): Payer: Medicaid Other

## 2019-05-23 DIAGNOSIS — Z3200 Encounter for pregnancy test, result unknown: Secondary | ICD-10-CM

## 2019-05-23 DIAGNOSIS — Z3201 Encounter for pregnancy test, result positive: Secondary | ICD-10-CM

## 2019-05-23 DIAGNOSIS — Z20822 Contact with and (suspected) exposure to covid-19: Secondary | ICD-10-CM

## 2019-05-23 LAB — POCT URINE PREGNANCY: Preg Test, Ur: POSITIVE — AB

## 2019-05-23 NOTE — Progress Notes (Signed)
Patient seen and assessed by nursing staff during this encounter. I have reviewed the chart and agree with the documentation and plan.  Mora Bellman, MD 05/23/2019 1:41 PM

## 2019-05-23 NOTE — Progress Notes (Signed)
..   Ann Clark presents today for UPT. She has no unusual complaints. LMP:03-30-19    OBJECTIVE: Appears well, in no apparent distress.  OB History    Gravida  2   Para  1   Term      Preterm  1   AB      Living  1     SAB      TAB      Ectopic      Multiple      Live Births  1          Home UPT Result:Positive In-Office UPT result:Positive I have reviewed the patient's medical, obstetrical, social, and family histories, and medications.   ASSESSMENT: Positive pregnancy test  PLAN Prenatal care to be completed at: [facility]

## 2019-05-25 LAB — NOVEL CORONAVIRUS, NAA: SARS-CoV-2, NAA: NOT DETECTED

## 2019-06-05 ENCOUNTER — Encounter

## 2019-06-05 ENCOUNTER — Encounter: Payer: Medicaid Other | Admitting: Family Medicine

## 2019-06-11 ENCOUNTER — Encounter: Payer: Self-pay | Admitting: Obstetrics

## 2019-06-12 ENCOUNTER — Other Ambulatory Visit: Payer: Self-pay

## 2019-06-12 ENCOUNTER — Ambulatory Visit (INDEPENDENT_AMBULATORY_CARE_PROVIDER_SITE_OTHER): Payer: Medicaid Other

## 2019-06-12 DIAGNOSIS — Z3480 Encounter for supervision of other normal pregnancy, unspecified trimester: Secondary | ICD-10-CM | POA: Insufficient documentation

## 2019-06-12 MED ORDER — BLOOD PRESSURE KIT DEVI: 1.0000 | 0 refills | Status: DC

## 2019-06-12 NOTE — Progress Notes (Signed)
I connected with  Ann Clark on 06/12/19 by a video enabled telemedicine application and verified that I am speaking with the correct person using two identifiers.   I discussed the limitations of evaluation and management by telemedicine. The patient expressed understanding and agreed to proceed.  PRENATAL INTAKE SUMMARY  Ann Clark presents today New OB Nurse Interview.  OB History    Gravida  2   Para  1   Term      Preterm  1   AB      Living  1     SAB      TAB      Ectopic      Multiple      Live Births  1          I have reviewed the patient's medical, obstetrical, social, and family histories, medications, and available lab results.  SUBJECTIVE She has no unusual complaints and complains of cramps x 8/10  OBJECTIVE Initial Intake (New OB)  GENERAL APPEARANCE: alert, well appearing, oriented to person, place and time   ASSESSMENT Normal pregnancy  PLAN Prenatal care at [facility]. Labs will be done at visit on 06/19/2019.  BP Cuff ordered and [employer].

## 2019-06-19 ENCOUNTER — Encounter: Payer: Medicaid Other | Admitting: Advanced Practice Midwife

## 2019-10-29 ENCOUNTER — Other Ambulatory Visit: Payer: Self-pay

## 2019-10-29 ENCOUNTER — Ambulatory Visit: Payer: Medicaid Other | Attending: Family Medicine | Admitting: Family Medicine

## 2019-10-29 ENCOUNTER — Encounter: Payer: Self-pay | Admitting: Family Medicine

## 2019-10-29 DIAGNOSIS — M545 Low back pain, unspecified: Secondary | ICD-10-CM

## 2019-10-29 DIAGNOSIS — M24411 Recurrent dislocation, right shoulder: Secondary | ICD-10-CM | POA: Diagnosis not present

## 2019-10-29 DIAGNOSIS — G8929 Other chronic pain: Secondary | ICD-10-CM

## 2019-10-29 NOTE — Progress Notes (Signed)
New Patient   Pain in back and shoulder
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
## 2019-10-31 ENCOUNTER — Ambulatory Visit (INDEPENDENT_AMBULATORY_CARE_PROVIDER_SITE_OTHER): Payer: Medicaid Other

## 2019-10-31 ENCOUNTER — Ambulatory Visit (INDEPENDENT_AMBULATORY_CARE_PROVIDER_SITE_OTHER): Payer: Medicaid Other | Admitting: Orthopedic Surgery

## 2019-10-31 ENCOUNTER — Other Ambulatory Visit: Payer: Self-pay

## 2019-10-31 ENCOUNTER — Encounter: Payer: Self-pay | Admitting: Orthopedic Surgery

## 2019-10-31 DIAGNOSIS — M25511 Pain in right shoulder: Secondary | ICD-10-CM
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
## 2019-11-05 ENCOUNTER — Ambulatory Visit: Payer: Self-pay

## 2019-11-05 ENCOUNTER — Encounter: Payer: Self-pay | Admitting: Family Medicine

## 2019-11-05 ENCOUNTER — Other Ambulatory Visit: Payer: Self-pay

## 2019-11-05 ENCOUNTER — Ambulatory Visit: Payer: Medicaid Other | Admitting: Family Medicine

## 2019-11-05 DIAGNOSIS — G8929 Other chronic pain: Secondary | ICD-10-CM | POA: Diagnosis not present

## 2019-11-05 DIAGNOSIS — M25571 Pain in right ankle and joints of right foot: Secondary | ICD-10-CM | POA: Diagnosis not present

## 2019-11-05 DIAGNOSIS — M545 Low back pain, unspecified: Secondary | ICD-10-CM
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
## 2019-11-07 ENCOUNTER — Other Ambulatory Visit: Payer: Self-pay

## 2019-11-07 ENCOUNTER — Other Ambulatory Visit (HOSPITAL_COMMUNITY)
Admission: RE | Admit: 2019-11-07 | Discharge: 2019-11-07 | Disposition: A | Discharge status: Home / Self Care | Payer: Medicaid Other | Source: Ambulatory Visit | Attending: Family Medicine | Admitting: Family Medicine

## 2019-11-07 ENCOUNTER — Encounter: Payer: Self-pay | Admitting: Family Medicine

## 2019-11-07 ENCOUNTER — Ambulatory Visit: Payer: Medicaid Other | Attending: Family Medicine | Admitting: Family Medicine

## 2019-11-07 VITALS — BP 123/85 | HR 74 | Temp 98.1°F | Ht 61.0 in | Wt 171.0 lb

## 2019-11-07 DIAGNOSIS — Z114 Encounter for screening for human immunodeficiency virus [HIV]: Secondary | ICD-10-CM

## 2019-11-07 DIAGNOSIS — D649 Anemia, unspecified: Secondary | ICD-10-CM | POA: Diagnosis not present

## 2019-11-07 DIAGNOSIS — R10819 Abdominal tenderness, unspecified site: Secondary | ICD-10-CM

## 2019-11-07 DIAGNOSIS — Z113 Encounter for screening for infections with a predominantly sexual mode of transmission: Secondary | ICD-10-CM | POA: Insufficient documentation

## 2019-11-07 DIAGNOSIS — M25511 Pain in right shoulder: Secondary | ICD-10-CM | POA: Diagnosis not present

## 2019-11-07 DIAGNOSIS — M542 Cervicalgia: Secondary | ICD-10-CM | POA: Diagnosis not present

## 2019-11-07 DIAGNOSIS — N898 Other specified noninflammatory disorders of vagina: Secondary | ICD-10-CM | POA: Diagnosis not present

## 2019-11-07 DIAGNOSIS — R5383 Other fatigue: Secondary | ICD-10-CM

## 2019-11-07 DIAGNOSIS — Z79899 Other long term (current) drug therapy: Secondary | ICD-10-CM | POA: Diagnosis not present

## 2019-11-07 DIAGNOSIS — M545 Low back pain, unspecified: Secondary | ICD-10-CM

## 2019-11-07 DIAGNOSIS — F419 Anxiety disorder, unspecified: Secondary | ICD-10-CM

## 2019-11-07 DIAGNOSIS — R108A3 Suprapubic tenderness: Secondary | ICD-10-CM

## 2019-11-07 DIAGNOSIS — Z87891 Personal history of nicotine dependence: Secondary | ICD-10-CM | POA: Insufficient documentation

## 2019-11-07 DIAGNOSIS — M62838 Other muscle spasm: Secondary | ICD-10-CM | POA: Diagnosis not present

## 2019-11-07 DIAGNOSIS — G8929 Other chronic pain: Secondary | ICD-10-CM

## 2019-11-07 LAB — POCT URINALYSIS DIP (CLINITEK)
Bilirubin, UA: NEGATIVE
Glucose, UA: NEGATIVE mg/dL
Ketones, POC UA: NEGATIVE mg/dL
Leukocytes, UA: NEGATIVE
Nitrite, UA: NEGATIVE
POC PROTEIN,UA: NEGATIVE
Spec Grav, UA: 1.025
Urobilinogen, UA: 0.2 U/dL
pH, UA: 6

## 2019-11-07 MED ORDER — VENLAFAXINE HCL ER 75 MG PO CP24: 75.0000 mg | ORAL_CAPSULE | Freq: Every day | ORAL | 3 refills | Status: DC

## 2019-11-07 MED ORDER — TIZANIDINE HCL 4 MG PO TABS: ORAL_TABLET | ORAL | 0 refills | Status: DC

## 2019-11-07 NOTE — Progress Notes (Signed)
Physical  Wants full STD check and checked for DM,    Talk about her GAD-7  36.7  171

## 2019-11-08 LAB — CBC
Hematocrit: 38.4 % (ref 34.0–46.6)
Hemoglobin: 12 g/dL (ref 11.1–15.9)
MCH: 23.5 pg — ABNORMAL LOW (ref 26.6–33.0)
MCHC: 31.3 g/dL — ABNORMAL LOW (ref 31.5–35.7)
MCV: 75 fL — ABNORMAL LOW (ref 79–97)
Platelets: 220 x10E3/uL (ref 150–450)
RBC: 5.11 x10E6/uL (ref 3.77–5.28)
RDW: 14.8 % (ref 11.7–15.4)
WBC: 5.5 x10E3/uL (ref 3.4–10.8)

## 2019-11-08 LAB — COMPREHENSIVE METABOLIC PANEL WITH GFR
ALT: 12 IU/L (ref 0–32)
AST: 17 IU/L (ref 0–40)
Albumin/Globulin Ratio: 1.5 (ref 1.2–2.2)
Albumin: 4.2 g/dL (ref 3.8–4.8)
Alkaline Phosphatase: 64 IU/L (ref 39–117)
BUN/Creatinine Ratio: 9 (ref 9–23)
BUN: 7 mg/dL (ref 6–20)
Bilirubin Total: 0.5 mg/dL (ref 0.0–1.2)
CO2: 24 mmol/L (ref 20–29)
Calcium: 8.8 mg/dL (ref 8.7–10.2)
Chloride: 104 mmol/L (ref 96–106)
Creatinine, Ser: 0.78 mg/dL (ref 0.57–1.00)
GFR calc Af Amer: 114 mL/min/1.73
GFR calc non Af Amer: 99 mL/min/1.73
Globulin, Total: 2.8 g/dL (ref 1.5–4.5)
Glucose: 74 mg/dL (ref 65–99)
Potassium: 4.3 mmol/L (ref 3.5–5.2)
Sodium: 136 mmol/L (ref 134–144)
Total Protein: 7 g/dL (ref 6.0–8.5)

## 2019-11-08 LAB — SYPHILIS: RPR W/REFLEX TO RPR TITER AND TREPONEMAL ANTIBODIES, TRADITIONAL SCREENING AND DIAGNOSIS ALGORITHM: RPR Ser Ql: NONREACTIVE

## 2019-11-08 LAB — VITAMIN D 25 HYDROXY (VIT D DEFICIENCY, FRACTURES): Vit D, 25-Hydroxy: 9.9 ng/mL — ABNORMAL LOW (ref 30.0–100.0)

## 2019-11-08 LAB — T4 AND TSH
T4, Total: 7.5 ug/dL (ref 4.5–12.0)
TSH: 0.647 u[IU]/mL (ref 0.450–4.500)

## 2019-11-08 LAB — HIV ANTIBODY (ROUTINE TESTING W REFLEX): HIV Screen 4th Generation wRfx: NONREACTIVE

## 2019-11-09 ENCOUNTER — Other Ambulatory Visit: Payer: Self-pay | Admitting: Family Medicine

## 2019-11-09 ENCOUNTER — Telehealth: Payer: Self-pay | Admitting: Family Medicine

## 2019-11-09 DIAGNOSIS — E559 Vitamin D deficiency, unspecified: Secondary | ICD-10-CM

## 2019-11-09 DIAGNOSIS — B9689 Other specified bacterial agents as the cause of diseases classified elsewhere: Secondary | ICD-10-CM

## 2019-11-09 LAB — CERVICOVAGINAL ANCILLARY ONLY
Bacterial Vaginitis (gardnerella): POSITIVE — AB
Candida Glabrata: NEGATIVE
Candida Vaginitis: NEGATIVE
Chlamydia: NEGATIVE
Comment: NEGATIVE
Comment: NEGATIVE
Comment: NEGATIVE
Comment: NEGATIVE
Comment: NEGATIVE
Comment: NORMAL
Neisseria Gonorrhea: NEGATIVE
Trichomonas: NEGATIVE

## 2019-11-09 LAB — URINE CULTURE: Organism ID, Bacteria: NO GROWTH

## 2019-11-09 MED ORDER — METRONIDAZOLE 500 MG PO TABS: 500.0000 mg | ORAL_TABLET | Freq: Two times a day (BID) | ORAL | 0 refills | Status: DC

## 2019-11-09 MED ORDER — VITAMIN D (ERGOCALCIFEROL) 1.25 MG (50000 UNIT) PO CAPS: ORAL_CAPSULE | ORAL | 1 refills | Status: DC

## 2019-11-09 NOTE — Telephone Encounter (Signed)
Patient called in and requested for results. Patient was identified by 2 patient identifiers. Results were give, verbalized understanding and had no further questions.

## 2019-11-14 ENCOUNTER — Telehealth: Payer: Self-pay | Admitting: Family Medicine

## 2019-11-14 ENCOUNTER — Other Ambulatory Visit: Payer: Self-pay | Admitting: Family Medicine

## 2019-11-14 NOTE — Telephone Encounter (Signed)
patient called in and requested for listed medication to be refilled and sent to [facility] #[id number] - [place], [place] - [address]  [address], [place] [address]   omeprazole (PRILOSEC) 40 MG capsule [[id number]] DISCONTINUED

## 2019-11-14 NOTE — Telephone Encounter (Signed)
Please find out if patient is currently pregnant or breast-feeding

## 2019-11-14 NOTE — Progress Notes (Unsigned)
Patient ID: Ann Clark, female   DOB: [birth year], 34 y.o.   MRN: [id number]   Phone message received that patient would like to have refill of omeprazole 40 mg daily.

## 2019-11-15 ENCOUNTER — Other Ambulatory Visit: Payer: Self-pay

## 2019-11-15 ENCOUNTER — Encounter: Payer: Self-pay | Admitting: *Deleted

## 2019-11-15 DIAGNOSIS — M542 Cervicalgia: Secondary | ICD-10-CM

## 2019-11-15 NOTE — Telephone Encounter (Signed)
Also sent patient a message via her mychart.

## 2019-11-15 NOTE — Telephone Encounter (Signed)
LMOM

## 2019-11-19 ENCOUNTER — Other Ambulatory Visit: Payer: Self-pay | Admitting: Family Medicine

## 2019-11-19 DIAGNOSIS — K219 Gastro-esophageal reflux disease without esophagitis: Secondary | ICD-10-CM

## 2019-11-19 MED ORDER — OMEPRAZOLE 40 MG PO CPDR: 40.0000 mg | DELAYED_RELEASE_CAPSULE | Freq: Every day | ORAL | 1 refills | Status: DC

## 2019-11-19 NOTE — Progress Notes (Signed)
Patient ID: Ann Clark, female   DOB: December 23, 1985, 34 y.o.   MRN: [id number]   Patient left message wanting refill of Omeprazole. She denies current pregnancy or breastfeeding

## 2019-11-27 NOTE — Progress Notes (Signed)
Subjective:  Patient ID: Ann Clark, female    DOB: [birth year]  Age: 34 y.o. MRN: [id number]  CC: No chief complaint on file.   HPI Ann Clark, 34 year old female, status post telemedicine visit on 10/29/2019, who presents in follow-up of neck and back pain and she has some additional concerns.  She has noticed some recent vaginal discharge that is mostly clear.  She would like to have screening for sexually transmitted diseases at today's visit.  She also complains of some issues with fatigue that has been ongoing.  She also has recurrent issues with right-sided mid back pain which does not radiate.  Pain ranges from a 6-8 on a 0-to-10 scale and can be made worse with activity.  She did recently see orthopedics regarding both her right mid back pain as well as right-sided neck pain and recurrent right shoulder dislocations.  She is considering surgery for her recurrent right shoulder dislocations.  She continues to have dull aching sensation in the right upper back/neck area and this gets worse if she has been doing repetitive motion/work.  She also reports concerns about recurrent anxiety.  She sometimes has difficulty sleeping due to recurrent worry.  She would be willing to take medication to help with her anxiety.  She denies any suicidal thoughts or ideations.          She does not feel as if she is having any urinary frequency or dysuria.  She denies any abdominal, pelvic or vaginal pain.  She has had mild vaginal discharge.  She denies any fever or chills.  She has had no recent chest pain or palpitations, no shortness of breath or cough.  Past Medical History:  Diagnosis Date  . Anemia   . Anxiety   . Preterm labor   . Shoulder pain     Past Surgical History:  Procedure Laterality Date  . EUSTACHIAN TUBE DILATION    . TONSILLECTOMY      Family History  Problem Relation Age of Onset  . COPD Mother   . Diabetes Mother   . Hyperlipidemia Mother   . Hypertension Mother   .  Anxiety disorder Mother   . Arthritis Mother   . Asthma Mother   . Depression Mother   . Drug abuse Mother   . Vision loss Mother   . Kidney disease Father   . Drug abuse Father   . Heart Problems Father   . ADD / ADHD Sister   . Heart disease Sister   . Hearing loss Son     Social History   Tobacco Use  . Smoking status: Former Smoker    Packs/day: 0.25    Types: Cigarettes  . Smokeless tobacco: Never Used  . Tobacco comment: occasional  Substance Use Topics  . Alcohol use: Yes    Alcohol/week: 2.0 - 3.0 standard drinks    Types: 2 - 3 Shots of liquor per week    Comment: two days weekly    ROS Review of Systems  Constitutional: Positive for fatigue. Negative for chills and fever.  HENT: Negative for nosebleeds, sinus pressure, sinus pain, sore throat and trouble swallowing.   Eyes: Negative for photophobia and visual disturbance.  Respiratory: Negative for cough and shortness of breath.   Cardiovascular: Negative for chest pain and palpitations.  Gastrointestinal: Negative for abdominal pain, constipation, diarrhea, nausea and vomiting.  Endocrine: Negative for cold intolerance, heat intolerance, polydipsia, polyphagia and polyuria.  Genitourinary: Positive for vaginal discharge. Negative for pelvic  pain and vaginal pain.  Musculoskeletal: Positive for back pain. Negative for arthralgias.  Skin: Negative for rash and wound.  Neurological: Negative for dizziness and headaches.  Hematological: Negative for adenopathy. Does not bruise/bleed easily.  Psychiatric/Behavioral: Negative for self-injury and suicidal ideas. The patient is nervous/anxious.     Objective:   Today's Vitals: BP 123/85   Pulse 74   Temp 98.1 F (36.7 C) (Temporal)   Ht 5' 1" (1.549 m)   Wt 171 lb (77.6 kg)   LMP 10/21/2019 (Approximate)   SpO2 98%   BMI 32.31 kg/m   Physical Exam Vitals and nursing note reviewed.  Constitutional:      General: She is not in acute distress.     Appearance: Normal appearance.  Neck:     Vascular: No carotid bruit.  Cardiovascular:     Rate and Rhythm: Normal rate and regular rhythm.  Pulmonary:     Effort: Pulmonary effort is normal.     Breath sounds: Normal breath sounds.  Abdominal:     Palpations: Abdomen is soft.     Tenderness: There is abdominal tenderness (mild suprapubic discomfort ). There is no right CVA tenderness, left CVA tenderness, guarding or rebound.  Musculoskeletal:        General: Tenderness (Right mid thoracic/upper lumbar discomfort to palpation; mild muscle spasm) present.     Cervical back: Normal range of motion and neck supple. Tenderness (Tenderness over the right posterior cervical paraspinous muscles and trapezius area right greater than left) present. No rigidity (Spasm of posterior cervical paraspinous muscles and trapezius muscles right greater than left).     Right lower leg: No edema.     Left lower leg: No edema.  Lymphadenopathy:     Cervical: No cervical adenopathy.  Skin:    General: Skin is warm and dry.  Neurological:     General: No focal deficit present.     Mental Status: She is alert and oriented to person, place, and time.  Psychiatric:        Mood and Affect: Mood normal.        Behavior: Behavior normal.     Assessment & Plan:   1. Fatigue, unspecified type She reports that she is having some issues with chronic fatigue.  Discussed with patient that labs will be done to try and determine a cause or contributing factor to her fatigue.  She will have testing for HIV, comprehensive metabolic panel to look for elevated glucose, electrolyte abnormality or liver enzyme abnormality, CBC to look for anemia or blood disorder, T4 and TSH to look for thyroid disorder and vitamin D level to look for vitamin D deficiency.  She will be notified of the results and if any further treatment is needed based on these results. - HIV antibody (with reflex) - Comprehensive metabolic panel - CBC -  T4 AND TSH - Vitamin D, 25-hydroxy  2. Screening for STDs (sexually transmitted diseases); 3.  Screening for HIV 8. Vaginal discharge Patient reports that she has had some mild vaginal discharge and would like testing for sexually transmitted diseases.  She performed cervicovaginal self swabbing at today's visit and will additionally have blood test for HIV and syphilis.  She will be notified of the results and if any further treatment is needed based on the results. - HIV antibody (with reflex) - RPR - Cervicovaginal ancillary only  4. Suprapubic tenderness On exam, patient has some mild suprapubic tenderness and urinalysis done and this will also be sent  for culture and she will be notified if any further treatment is needed based on the results.  If she has any acute worsening of abdominal pain, she is encouraged to go to urgent care or ED for further evaluation. - POCT URINALYSIS DIP (CLINITEK) - Urine Culture  5. Chronic right-sided low back pain without sciatica; 6. Neck pain Notes from patient's recent orthopedic appointments reviewed.  We will have patient try tizanidine 2 mg every 8 hours as needed for muscle spasm and up to 4 mg at bedtime to help with muscle spasm as well as sleep as patient sometimes has difficulty with sleep not only due to anxiety but also due to that and right neck/shoulder pain. - tiZANidine (ZANAFLEX) 4 MG tablet; 1/2 tablet every 8 hours as needed for muscle spasm and may take 1 whole tablet at bedtime if needed  Dispense: 60 tablet; Refill: 0  7. Anxiety She reports recurrent issues with worry and fatigue.  Medication options discussed and patient agrees to try venlafaxine 75 mg once daily.  She is advised to take the medication early in the morning as taking the medication later in the day can interfere with sleep.  If she has any worsening of anxiety or suicidal thoughts/ideation she should go to the emergency department for further evaluation otherwise she  has been asked to return to clinic in approximately 4 weeks in follow-up of new start of medication but sooner if any problems or concerns. - venlafaxine XR (EFFEXOR-XR) 75 MG 24 hr capsule; Take 1 capsule (75 mg total) by mouth daily with breakfast.  Dispense: 30 capsule; Refill: 3      Outpatient Encounter Medications as of 11/07/2019  Medication Sig  . Blood Pressure Monitoring (BLOOD PRESSURE KIT) DEVI 1 kit by Does not apply route once a week. Check BP regularly and record readings into the [employer] App.  Large Cuff. DX O90.0  . diphenhydrAMINE HCl (BENADRYL ALLERGY PO) Take by mouth.  . diphenhydramine-acetaminophen (TYLENOL PM) 25-500 MG TABS tablet Take 1-2 tablets by mouth at bedtime as needed (sleep).   . hydrOXYzine (ATARAX/VISTARIL) 25 MG tablet Take 1 tablet (25 mg total) by mouth every 6 (six) hours as needed for anxiety. (Patient not taking: Reported on 05/23/2019)  . methocarbamol (ROBAXIN) 500 MG tablet Take 1 tablet (500 mg total) by mouth at bedtime as needed for muscle spasms. (Patient not taking: Reported on 03/01/2019)  . tiZANidine (ZANAFLEX) 4 MG tablet 1/2 tablet every 8 hours as needed for muscle spasm and may take 1 whole tablet at bedtime if needed  . venlafaxine XR (EFFEXOR-XR) 75 MG 24 hr capsule Take 1 capsule (75 mg total) by mouth daily with breakfast.  . [DISCONTINUED] omeprazole (PRILOSEC) 40 MG capsule Take 1 capsule (40 mg total) by mouth daily. (Patient not taking: Reported on 03/01/2019)   No facility-administered encounter medications on file as of 11/07/2019.    An After Visit Summary was printed and given to the patient.   Follow-up: Return in about 4 weeks (around 12/05/2019) for anxiety/new medication; mid July-physical.   More than 30 minutes of face-to-face time spent with the patient at today's visit obtaining her recent and current medical issues, obtaining review of systems, performing physical examination, creating assessment and treatment plan which  were then discussed with the patient and questions were answered to patient's satisfaction.  An additional 15 minutes was spent on pre and post visit review of chart including recent specialty visits as well as completion of today's visit note.  Antony Blackbird MD

## 2019-11-30 ENCOUNTER — Other Ambulatory Visit: Payer: Medicaid Other

## 2019-12-04 ENCOUNTER — Ambulatory Visit: Payer: Medicaid Other | Attending: Orthopedic Surgery | Admitting: Physical Therapy

## 2020-01-07 ENCOUNTER — Encounter: Payer: Self-pay | Admitting: Family Medicine

## 2020-01-07 ENCOUNTER — Other Ambulatory Visit (HOSPITAL_COMMUNITY)
Admission: RE | Admit: 2020-01-07 | Discharge: 2020-01-07 | Disposition: A | Discharge status: Home / Self Care | Payer: Medicaid Other | Source: Ambulatory Visit | Attending: Family Medicine | Admitting: Family Medicine

## 2020-01-07 ENCOUNTER — Other Ambulatory Visit: Payer: Self-pay

## 2020-01-07 ENCOUNTER — Ambulatory Visit: Payer: Medicaid Other | Attending: Family Medicine | Admitting: Family Medicine

## 2020-01-07 VITALS — BP 94/61 | HR 73 | Ht 61.0 in | Wt 169.4 lb

## 2020-01-07 DIAGNOSIS — Z8751 Personal history of pre-term labor: Secondary | ICD-10-CM | POA: Diagnosis not present

## 2020-01-07 DIAGNOSIS — Z79899 Other long term (current) drug therapy: Secondary | ICD-10-CM | POA: Diagnosis not present

## 2020-01-07 DIAGNOSIS — F419 Anxiety disorder, unspecified: Secondary | ICD-10-CM | POA: Insufficient documentation

## 2020-01-07 DIAGNOSIS — Z131 Encounter for screening for diabetes mellitus: Secondary | ICD-10-CM | POA: Diagnosis not present

## 2020-01-07 DIAGNOSIS — Z124 Encounter for screening for malignant neoplasm of cervix: Secondary | ICD-10-CM | POA: Diagnosis not present

## 2020-01-07 DIAGNOSIS — Z0000 Encounter for general adult medical examination without abnormal findings: Secondary | ICD-10-CM

## 2020-01-07 DIAGNOSIS — Z113 Encounter for screening for infections with a predominantly sexual mode of transmission: Secondary | ICD-10-CM | POA: Diagnosis present

## 2020-01-07 DIAGNOSIS — B3731 Acute candidiasis of vulva and vagina: Secondary | ICD-10-CM

## 2020-01-07 DIAGNOSIS — B373 Candidiasis of vulva and vagina: Secondary | ICD-10-CM | POA: Diagnosis not present

## 2020-01-07 LAB — POCT GLYCOSYLATED HEMOGLOBIN (HGB A1C): HbA1c, POC (controlled diabetic range): 5.2 % (ref 0.0–7.0)

## 2020-01-07 LAB — POCT URINALYSIS DIP (CLINITEK)
Bilirubin, UA: NEGATIVE
Glucose, UA: NEGATIVE mg/dL
Ketones, POC UA: NEGATIVE mg/dL
Leukocytes, UA: NEGATIVE
Nitrite, UA: NEGATIVE
Spec Grav, UA: 1.02 (ref 1.010–1.025)
Urobilinogen, UA: 0.2 E.U./dL
pH, UA: 8.5 — AB (ref 5.0–8.0)

## 2020-01-07 MED ORDER — IBUPROFEN 600 MG PO TABS: 600.0000 mg | ORAL_TABLET | Freq: Three times a day (TID) | ORAL | 1 refills | Status: DC | PRN

## 2020-01-07 MED ORDER — FLUCONAZOLE 150 MG PO TABS
150.0000 mg | ORAL_TABLET | Freq: Once | ORAL | 0 refills | Status: AC
Start: 2020-01-07 — End: 2020-01-07

## 2020-01-07 NOTE — Progress Notes (Signed)
Subjective:  Patient ID: Ann Clark, female    DOB: October 02, 1985  Age: 34 y.o. MRN: [id number]  CC: Annual Exam and Gynecologic Exam   HPI Ann Clark presents for a complete physical exam  Ann Clark has been spotting lightly and usually has her periods at the end of the month.  Ann Clark endorses being stressed lately. Ann Clark was bitten a dog 2 weeks ago but cleaned it and has been soaking her right thumb and left dorsum in peroxide.  Ann Clark is up-to-date on her tetanus shot with her next booster dose due in 2027  Past Medical History:  Diagnosis Date  . Anemia   . Anxiety   . Preterm labor   . Shoulder pain     Past Surgical History:  Procedure Laterality Date  . EUSTACHIAN TUBE DILATION    . TONSILLECTOMY      Family History  Problem Relation Age of Onset  . COPD Mother   . Diabetes Mother   . Hyperlipidemia Mother   . Hypertension Mother   . Anxiety disorder Mother   . Arthritis Mother   . Asthma Mother   . Depression Mother   . Drug abuse Mother   . Vision loss Mother   . Kidney disease Father   . Drug abuse Father   . Heart Problems Father   . ADD / ADHD Sister   . Heart disease Sister   . Hearing loss Son     Allergies  Allergen Reactions  . No Known Allergies     Outpatient Medications Prior to Visit  Medication Sig Dispense Refill  . Blood Pressure Monitoring (BLOOD PRESSURE KIT) DEVI 1 kit by Does not apply route once a week. Check BP regularly and record readings into the [employer] App.  Large Cuff. DX O90.0 1 each 0  . diphenhydrAMINE HCl (BENADRYL ALLERGY PO) Take by mouth.    . diphenhydramine-acetaminophen (TYLENOL PM) 25-500 MG TABS tablet Take 1-2 tablets by mouth at bedtime as needed (sleep).     Marland Kitchen omeprazole (PRILOSEC) 40 MG capsule Take 1 capsule (40 mg total) by mouth daily. 90 capsule 1  . tiZANidine (ZANAFLEX) 4 MG tablet 1/2 tablet every 8 hours as needed for muscle spasm and may take 1 whole tablet at bedtime if needed 60 tablet 0  . venlafaxine  XR (EFFEXOR-XR) 75 MG 24 hr capsule Take 1 capsule (75 mg total) by mouth daily with breakfast. 30 capsule 3  . Vitamin D, Ergocalciferol, (DRISDOL) 1.25 MG (50000 UNIT) CAPS capsule 1 pill, once per week 12 capsule 1  . hydrOXYzine (ATARAX/VISTARIL) 25 MG tablet Take 1 tablet (25 mg total) by mouth every 6 (six) hours as needed for anxiety. (Patient not taking: Reported on 05/23/2019) 12 tablet 0  . methocarbamol (ROBAXIN) 500 MG tablet Take 1 tablet (500 mg total) by mouth at bedtime as needed for muscle spasms. (Patient not taking: Reported on 03/01/2019) 30 tablet 2  . metroNIDAZOLE (FLAGYL) 500 MG tablet Take 1 tablet (500 mg total) by mouth 2 (two) times daily. (Patient not taking: Reported on 01/07/2020) 14 tablet 0   No facility-administered medications prior to visit.     ROS Review of Systems  Constitutional: Negative for activity change, appetite change and fatigue.  HENT: Negative for congestion, sinus pressure and sore throat.   Eyes: Negative for visual disturbance.  Respiratory: Negative for cough, chest tightness, shortness of breath and wheezing.   Cardiovascular: Negative for chest pain and palpitations.  Gastrointestinal: Negative for abdominal  distention, abdominal pain and constipation.  Endocrine: Negative for polydipsia.  Genitourinary: Negative for dysuria and frequency.  Musculoskeletal: Negative for arthralgias and back pain.  Skin: Negative for rash.  Neurological: Negative for tremors, light-headedness and numbness.  Hematological: Does not bruise/bleed easily.  Psychiatric/Behavioral: Negative for agitation and behavioral problems.    Objective:  BP 94/61   Pulse 73   Ht _0  (1.549 m)   Wt 169 lb 6.4 oz (76.8 kg)   LMP 03/30/2019   SpO2 99%   BMI 32.01 kg/m   BP/Weight 01/07/2020 5/61/5379 09/29/2759  Systolic BP 94 470 929  Diastolic BP 61 85 80  Wt. (Lbs) 169.4 171 160  BMI 32.01 32.31 30.23      Physical Exam Constitutional:      General:  Ann Clark is not in acute distress.    Appearance: Ann Clark is well-developed. Ann Clark is not diaphoretic.  HENT:     Head: Normocephalic.     Right Ear: External ear normal.     Left Ear: External ear normal.     Nose: Nose normal.  Eyes:     Conjunctiva/sclera: Conjunctivae normal.     Pupils: Pupils are equal, round, and reactive to light.  Neck:     Vascular: No JVD.  Cardiovascular:     Rate and Rhythm: Normal rate and regular rhythm.     Heart sounds: Normal heart sounds. No murmur heard.  No gallop.   Pulmonary:     Effort: Pulmonary effort is normal. No respiratory distress.     Breath sounds: Normal breath sounds. No wheezing or rales.  Chest:     Chest wall: No tenderness.     Breasts:        Right: No inverted nipple, mass or tenderness.        Left: No inverted nipple, mass or tenderness.  Abdominal:     General: Bowel sounds are normal. There is no distension.     Palpations: Abdomen is soft. There is no mass.     Tenderness: There is no abdominal tenderness.  Genitourinary:    Comments: External genitalia-normal Vagina-cheesy whitish discharge Cervix, adnexa-normal Musculoskeletal:        General: No tenderness. Normal range of motion.     Cervical back: Normal range of motion.  Skin:    General: Skin is warm and dry.     Comments: Healed scars from dog bite with no evidence of infection  Neurological:     Mental Status: Ann Clark is alert and oriented to person, place, and time.     Deep Tendon Reflexes: Reflexes are normal and symmetric.     CMP Latest Ref Rng & Units 11/07/2019 03/01/2019 02/28/2019  Glucose 65 - 99 mg/dL 74 93 83  BUN 6 - 20 mg/dL _1 Creatinine 0.57 - 1.00 mg/dL 0.78 0.90 0.83  Sodium 134 - 144 mmol/L 136 135 135  Potassium 3.5 - 5.2 mmol/L 4.3 3.4(L) 3.7  Chloride 96 - 106 mmol/L 104 104 101  CO2 20 - 29 mmol/L 24 24 21(L)  Calcium 8.7 - 10.2 mg/dL 8.8 8.9 8.9  Total Protein 6.0 - 8.5 g/dL 7.0 - -  Total Bilirubin 0.0 - 1.2 mg/dL 0.5 - -  Alkaline  Phos 39 - 117 IU/L 64 - -  AST 0 - 40 IU/L 17 - -  ALT 0 - 32 IU/L 12 - -    Lipid Panel     Component Value Date/Time   CHOL 158 01/10/2017 1548  TRIG 62 01/10/2017 1548   HDL 54 01/10/2017 1548   CHOLHDL 2.9 01/10/2017 1548   CHOLHDL 3.0 07/30/2015 1113   VLDL 12 07/30/2015 1113   LDLCALC 92 01/10/2017 1548    CBC    Component Value Date/Time   WBC 5.5 11/07/2019 1615   WBC 6.6 03/01/2019 1421   RBC 5.11 11/07/2019 1615   RBC 4.85 03/01/2019 1421   HGB 12.0 11/07/2019 1615   HCT 38.4 11/07/2019 1615   PLT 220 11/07/2019 1615   MCV 75 (L) 11/07/2019 1615   MCH 23.5 (L) 11/07/2019 1615   MCH 24.5 (L) 03/01/2019 1421   MCHC 31.3 (L) 11/07/2019 1615   MCHC 32.1 03/01/2019 1421   RDW 14.8 11/07/2019 1615   LYMPHSABS 2.9 08/27/2008 0008   MONOABS 0.6 08/27/2008 0008   EOSABS 0.1 08/27/2008 0008   BASOSABS 0.0 08/27/2008 0008    Lab Results  Component Value Date   HGBA1C 5.2 01/07/2020    Assessment & Plan:  1. Annual physical exam Counseled on 150 minutes of exercise per week, healthy eating (including decreased daily intake of saturated fats, cholesterol, added sugars, sodium), STI prevention, routine healthcare maintenance. - POCT URINALYSIS DIP (CLINITEK)  2. Screening for cervical cancer - Cytology - PAP([place])  3. Screening for STD (sexually transmitted disease) - Cervicovaginal ancillary only - HIV antibody (with reflex) - HSV Type I/II IgG, IgMw/ reflex  4. Vaginal candidiasis   5. Screening for diabetes mellitus - POCT glycosylated hemoglobin (Hb A1C)    Meds ordered this encounter  Medications  . ibuprofen (ADVIL) 600 MG tablet    Sig: Take 1 tablet (600 mg total) by mouth every 8 (eight) hours as needed.    Dispense:  30 tablet    Refill:  1  . fluconazole (DIFLUCAN) 150 MG tablet    Sig: Take 1 tablet (150 mg total) by mouth once for 1 dose.    Dispense:  1 tablet    Refill:  0    Follow-up: Return in about 3 months (around  04/08/2020) for PCP; chronic disease management.       Charlott Rakes, MD, FAAFP. [facility], [place]   01/07/2020, 5:43 PM

## 2020-01-07 NOTE — Progress Notes (Signed)
As been having abdominal pain and spotting.  States that she got bit by a dog 2 weeks ago.

## 2020-01-07 NOTE — Patient Instructions (Signed)
Health Maintenance, Female Adopting a healthy lifestyle and getting preventive care are important in promoting health and wellness. Ask your health care provider about:  The right schedule for you to have regular tests and exams.  Things you can do on your own to prevent diseases and keep yourself healthy. What should I know about diet, weight, and exercise? Eat a healthy diet   Eat a diet that includes plenty of vegetables, fruits, low-fat dairy products, and lean protein.  Do not eat a lot of foods that are high in solid fats, added sugars, or sodium. Maintain a healthy weight Body mass index (BMI) is used to identify weight problems. It estimates body fat based on height and weight. Your health care provider can help determine your BMI and help you achieve or maintain a healthy weight. Get regular exercise Get regular exercise. This is one of the most important things you can do for your health. Most adults should:  Exercise for at least 150 minutes each week. The exercise should increase your heart rate and make you sweat (moderate-intensity exercise).  Do strengthening exercises at least twice a week. This is in addition to the moderate-intensity exercise.  Spend less time sitting. Even light physical activity can be beneficial. Watch cholesterol and blood lipids Have your blood tested for lipids and cholesterol at 36 years of age, then have this test every 5 years. Have your cholesterol levels checked more often if:  Your lipid or cholesterol levels are high.  You are older than 36 years of age.  You are at high risk for heart disease. What should I know about cancer screening? Depending on your health history and family history, you may need to have cancer screening at various ages. This may include screening for:  Breast cancer.  Cervical cancer.  Colorectal cancer.  Skin cancer.  Lung cancer. What should I know about heart disease, diabetes, and high blood  pressure? Blood pressure and heart disease  High blood pressure causes heart disease and increases the risk of stroke. This is more likely to develop in people who have high blood pressure readings, are of African descent, or are overweight.  Have your blood pressure checked: ? Every 3-5 years if you are 18-39 years of age. ? Every year if you are 40 years old or older. Diabetes Have regular diabetes screenings. This checks your fasting blood sugar level. Have the screening done:  Once every three years after age 40 if you are at a normal weight and have a low risk for diabetes.  More often and at a younger age if you are overweight or have a high risk for diabetes. What should I know about preventing infection? Hepatitis B If you have a higher risk for hepatitis B, you should be screened for this virus. Talk with your health care provider to find out if you are at risk for hepatitis B infection. Hepatitis C Testing is recommended for:  Everyone born from 1945 through 1965.  Anyone with known risk factors for hepatitis C. Sexually transmitted infections (STIs)  Get screened for STIs, including gonorrhea and chlamydia, if: ? You are sexually active and are younger than 36 years of age. ? You are older than 36 years of age and your health care provider tells you that you are at risk for this type of infection. ? Your sexual activity has changed since you were last screened, and you are at increased risk for chlamydia or gonorrhea. Ask your health care provider if   you are at risk.  Ask your health care provider about whether you are at high risk for HIV. Your health care provider may recommend a prescription medicine to help prevent HIV infection. If you choose to take medicine to prevent HIV, you should first get tested for HIV. You should then be tested every 3 months for as long as you are taking the medicine. Pregnancy  If you are about to stop having your period (premenopausal) and  you may become pregnant, seek counseling before you get pregnant.  Take 400 to 800 micrograms (mcg) of folic acid every day if you become pregnant.  Ask for birth control (contraception) if you want to prevent pregnancy. Osteoporosis and menopause Osteoporosis is a disease in which the bones lose minerals and strength with aging. This can result in bone fractures. If you are 65 years old or older, or if you are at risk for osteoporosis and fractures, ask your health care provider if you should:  Be screened for bone loss.  Take a calcium or vitamin D supplement to lower your risk of fractures.  Be given hormone replacement therapy (HRT) to treat symptoms of menopause. Follow these instructions at home: Lifestyle  Do not use any products that contain nicotine or tobacco, such as cigarettes, e-cigarettes, and chewing tobacco. If you need help quitting, ask your health care provider.  Do not use street drugs.  Do not share needles.  Ask your health care provider for help if you need support or information about quitting drugs. Alcohol use  Do not drink alcohol if: ? Your health care provider tells you not to drink. ? You are pregnant, may be pregnant, or are planning to become pregnant.  If you drink alcohol: ? Limit how much you use to 0-1 drink a day. ? Limit intake if you are breastfeeding.  Be aware of how much alcohol is in your drink. In the [place] one drink equals one 12 oz bottle of beer (355 mL), one 5 oz glass of wine (148 mL), or one 1 oz glass of hard liquor (44 mL). General instructions  Schedule regular health, dental, and eye exams.  Stay current with your vaccines.  Tell your health care provider if: ? You often feel depressed. ? You have ever been abused or do not feel safe at home. Summary  Adopting a healthy lifestyle and getting preventive care are important in promoting health and wellness.  Follow your health care provider's instructions about healthy  diet, exercising, and getting tested or screened for diseases.  Follow your health care provider's instructions on monitoring your cholesterol and blood pressure. This information is not intended to replace advice given to you by your health care provider. Make sure you discuss any questions you have with your health care provider. Document Revised: 06/07/2018 Document Reviewed: 06/07/2018 Elsevier Patient Education  2020 [employer]  

## 2020-01-08 LAB — HSV TYPE I/II IGG, IGMW/ REFLEX
HSV 1 Glycoprotein G Ab, IgG: >62.2 index — ABNORMAL HIGH (ref 0.00–0.90)
HSV 1 IgM: <1:10 {titer}
HSV 2 IgG, Type Spec: <0.91 index (ref 0.00–0.90)
HSV 2 IgM: <1:10 {titer}

## 2020-01-08 LAB — HIV ANTIBODY (ROUTINE TESTING W REFLEX): HIV Screen 4th Generation wRfx: NONREACTIVE

## 2020-01-09 LAB — CERVICOVAGINAL ANCILLARY ONLY
Chlamydia: NEGATIVE
Comment: NEGATIVE
Comment: NEGATIVE
Comment: NORMAL
Neisseria Gonorrhea: NEGATIVE
Trichomonas: NEGATIVE

## 2020-01-11 ENCOUNTER — Encounter: Payer: Self-pay | Admitting: Family Medicine

## 2020-01-11 LAB — CYTOLOGY - PAP
Comment: NEGATIVE
Diagnosis: NEGATIVE
High risk HPV: NEGATIVE

## 2020-01-11 NOTE — Progress Notes (Signed)
Patient has viewed the results.

## 2020-02-13 ENCOUNTER — Ambulatory Visit: Payer: Self-pay | Admitting: *Deleted

## 2020-02-13 NOTE — Telephone Encounter (Signed)
Patient calling with complaints of chest pain in the middle of her chest that has occurred for approximately 2-3 months. Pt describes the pain as a aching type of pain that occurs with activity such as working out and walking up stairs that causes her to be SOB. No chest pain or SOB at the time of call. No appt availability noted until Sept. Patient advised to seek treatment in the ED/UC for current symptoms. Patient wants to know if she could be worked in to have a sooner appt with a provider in the office and also if she could have a COVID test at the office. Attempted to call [facility] x2 but no answer. Will route to office for recommendations on COVID testing and appt.  Reason for Disposition  [1] Chest pain lasts > 5 minutes AND [2] occurred > 3 days ago (72 hours) AND [3] NO chest pain or cardiac symptoms now  Answer Assessment - Initial Assessment Questions 1. LOCATION: "Where does it hurt?"       In the middle of the chest 2. RADIATION: "Does the pain go anywhere else?" (e.g., into neck, jaw, arms, back) Right now can't really saw that it is coming from chest pelvis, surgery and shoulder and spine is painful right now 3. ONSET: "When did the chest pain begin?" (Minutes, hours or days)      2-3 months ago 4. PATTERN "Does the pain come and go, or has it been constant since it started?"  "Does it get worse with exertion?"      Comes and goes 5. DURATION: "How long does it last" (e.g., seconds, minutes, hours)     30 min period 6. SEVERITY: "How bad is the pain?"  (e.g., Scale 1-10; mild, moderate, or severe)    - MILD (1-3): doesn't interfere with normal activities     - MODERATE (4-7): interferes with normal activities or awakens from sleep    - SEVERE (8-10): excruciating pain, unable to do any normal activities       Right now it's a 6-7, feeling tension as I breathe like a light pain usually is a 8 or 9 7. CARDIAC RISK FACTORS: "Do you have any history of heart problems or risk factors for  heart disease?" (e.g., angina, prior heart attack; diabetes, high blood pressure, high cholesterol, smoker, or strong family history of heart disease)     Smokes on occasion. Not sure if father has HD but had a stent placed in his heart, sister had a heart murmur 8. PULMONARY RISK FACTORS: "Do you have any history of lung disease?"  (e.g., blood clots in lung, asthma, emphysema, birth control pills)     No 9. CAUSE: "What do you think is causing the chest pain?"      10. OTHER SYMPTOMS: "Do you have any other symptoms?" (e.g., dizziness, nausea, vomiting, sweating, fever, difficulty breathing, cough)       No 11. PREGNANCY: "Is there any chance you are pregnant?" "When was your last menstrual period?"      Had cycle 3 days ago, I don't believe that I am pregnant  Protocols used: CHEST PAIN-A-AH

## 2020-02-14 NOTE — Telephone Encounter (Signed)
Left message on voicemail advised to take advise from nurse that she spoke to on yesterday.  Will  return call tomorrow for f/u.

## 2020-02-15 ENCOUNTER — Other Ambulatory Visit: Payer: Self-pay

## 2020-02-15 ENCOUNTER — Emergency Department (HOSPITAL_COMMUNITY)
Admission: EM | Admit: 2020-02-15 | Discharge: 2020-02-16 | Disposition: A | Discharge status: Home / Self Care | Payer: Medicaid Other | Attending: Emergency Medicine | Admitting: Emergency Medicine

## 2020-02-15 ENCOUNTER — Emergency Department (HOSPITAL_COMMUNITY): Payer: Medicaid Other

## 2020-02-15 ENCOUNTER — Encounter (HOSPITAL_COMMUNITY): Payer: Self-pay

## 2020-02-15 DIAGNOSIS — Z5321 Procedure and treatment not carried out due to patient leaving prior to being seen by health care provider: Secondary | ICD-10-CM | POA: Insufficient documentation

## 2020-02-15 DIAGNOSIS — R079 Chest pain, unspecified: Secondary | ICD-10-CM | POA: Diagnosis not present

## 2020-02-15 DIAGNOSIS — R110 Nausea: Secondary | ICD-10-CM | POA: Diagnosis not present

## 2020-02-15 DIAGNOSIS — R109 Unspecified abdominal pain: Secondary | ICD-10-CM | POA: Insufficient documentation

## 2020-02-15 LAB — CBC
HCT: 34 % — ABNORMAL LOW (ref 36.0–46.0)
Hemoglobin: 11 g/dL — ABNORMAL LOW (ref 12.0–15.0)
MCH: 24.3 pg — ABNORMAL LOW (ref 26.0–34.0)
MCHC: 32.4 g/dL (ref 30.0–36.0)
MCV: 75.2 fL — ABNORMAL LOW (ref 80.0–100.0)
Platelets: 226 10*3/uL (ref 150–400)
RBC: 4.52 MIL/uL (ref 3.87–5.11)
RDW: 14.7 % (ref 11.5–15.5)
WBC: 5.4 10*3/uL (ref 4.0–10.5)
nRBC: 0 % (ref 0.0–0.2)

## 2020-02-15 LAB — COMPREHENSIVE METABOLIC PANEL
ALT: 10 U/L (ref 0–44)
AST: 15 U/L (ref 15–41)
Albumin: 4.1 g/dL (ref 3.5–5.0)
Alkaline Phosphatase: 43 U/L (ref 38–126)
Anion gap: 10 (ref 5–15)
BUN: 9 mg/dL (ref 6–20)
CO2: 21 mmol/L — ABNORMAL LOW (ref 22–32)
Calcium: 9 mg/dL (ref 8.9–10.3)
Chloride: 107 mmol/L (ref 98–111)
Creatinine, Ser: 0.76 mg/dL (ref 0.44–1.00)
GFR calc Af Amer: >60 mL/min (ref >60)
GFR calc non Af Amer: >60 mL/min (ref >60)
Glucose, Bld: 81 mg/dL (ref 70–99)
Potassium: 3.5 mmol/L (ref 3.5–5.1)
Sodium: 138 mmol/L (ref 135–145)
Total Bilirubin: 0.7 mg/dL (ref 0.3–1.2)
Total Protein: 7.1 g/dL (ref 6.5–8.1)

## 2020-02-15 LAB — TROPONIN I (HIGH SENSITIVITY): Troponin I (High Sensitivity): <2 ng/L (ref <18)

## 2020-02-15 LAB — I-STAT BETA HCG BLOOD, ED (NOT ORDERABLE): I-stat hCG, quantitative: <5 m[IU]/mL (ref <5)

## 2020-02-15 NOTE — ED Triage Notes (Signed)
Arrived POV from home. Patient reports chest pain that has off and on over past year. Patient also reports abdominal pain that has been over past couple of months. Patient also reports mild nausea without vomiting. NAD

## 2020-02-16 LAB — TROPONIN I (HIGH SENSITIVITY): Troponin I (High Sensitivity): <2 ng/L (ref <18)

## 2020-02-16 NOTE — ED Notes (Signed)
Patient became very loud and verbally aggressive in lobby.

## 2020-02-18 NOTE — Telephone Encounter (Signed)
Patient name and DOB verified. Alert and oriented, NAD. Was seen in ED over the weekend. Scheduled  an appointment to f/u with chest and abdominal discomfort. Patient aware of appt.

## 2020-02-20 ENCOUNTER — Ambulatory Visit: Payer: Medicaid Other | Admitting: Physician Assistant

## 2020-02-20 ENCOUNTER — Other Ambulatory Visit: Payer: Self-pay

## 2020-02-20 ENCOUNTER — Other Ambulatory Visit (HOSPITAL_COMMUNITY)
Admission: RE | Admit: 2020-02-20 | Discharge: 2020-02-20 | Disposition: A | Discharge status: Home / Self Care | Payer: Medicaid Other | Source: Ambulatory Visit | Attending: Primary Care | Admitting: Primary Care

## 2020-02-20 VITALS — BP 106/74 | HR 65 | Temp 98.7°F | Resp 18 | Ht 64.0 in

## 2020-02-20 DIAGNOSIS — G8929 Other chronic pain: Secondary | ICD-10-CM

## 2020-02-20 DIAGNOSIS — N898 Other specified noninflammatory disorders of vagina: Secondary | ICD-10-CM | POA: Insufficient documentation

## 2020-02-20 DIAGNOSIS — G4700 Insomnia, unspecified: Secondary | ICD-10-CM

## 2020-02-20 DIAGNOSIS — M545 Low back pain, unspecified: Secondary | ICD-10-CM

## 2020-02-20 DIAGNOSIS — R079 Chest pain, unspecified: Secondary | ICD-10-CM | POA: Diagnosis not present

## 2020-02-20 DIAGNOSIS — M542 Cervicalgia: Secondary | ICD-10-CM

## 2020-02-20 DIAGNOSIS — D649 Anemia, unspecified: Secondary | ICD-10-CM

## 2020-02-20 DIAGNOSIS — R102 Pelvic and perineal pain: Secondary | ICD-10-CM

## 2020-02-20 DIAGNOSIS — R1024 Suprapubic pain: Secondary | ICD-10-CM

## 2020-02-20 DIAGNOSIS — K219 Gastro-esophageal reflux disease without esophagitis: Secondary | ICD-10-CM | POA: Diagnosis not present

## 2020-02-20 DIAGNOSIS — Z791 Long term (current) use of non-steroidal anti-inflammatories (NSAID): Secondary | ICD-10-CM

## 2020-02-20 DIAGNOSIS — F411 Generalized anxiety disorder: Secondary | ICD-10-CM

## 2020-02-20 DIAGNOSIS — Z2821 Immunization not carried out because of patient refusal: Secondary | ICD-10-CM

## 2020-02-20 DIAGNOSIS — B9689 Other specified bacterial agents as the cause of diseases classified elsewhere: Secondary | ICD-10-CM

## 2020-02-20 DIAGNOSIS — E559 Vitamin D deficiency, unspecified: Secondary | ICD-10-CM

## 2020-02-20 DIAGNOSIS — N760 Acute vaginitis: Secondary | ICD-10-CM

## 2020-02-20 LAB — POCT URINALYSIS DIP (CLINITEK)
Bilirubin, UA: NEGATIVE
Glucose, UA: NEGATIVE mg/dL
Ketones, POC UA: NEGATIVE mg/dL
Leukocytes, UA: NEGATIVE
Nitrite, UA: NEGATIVE
POC PROTEIN,UA: NEGATIVE
Spec Grav, UA: >=1.03 — AB (ref 1.010–1.025)
Urobilinogen, UA: 0.2 E.U./dL
pH, UA: 5.5 (ref 5.0–8.0)

## 2020-02-20 MED ORDER — HYDROXYZINE HCL 10 MG PO TABS: 10.0000 mg | ORAL_TABLET | Freq: Three times a day (TID) | ORAL | 0 refills | Status: DC | PRN

## 2020-02-20 MED ORDER — OMEPRAZOLE 40 MG PO CPDR: 40.0000 mg | DELAYED_RELEASE_CAPSULE | Freq: Every day | ORAL | 1 refills | Status: DC

## 2020-02-20 MED ORDER — TIZANIDINE HCL 4 MG PO TABS: ORAL_TABLET | ORAL | 0 refills | Status: DC

## 2020-02-20 NOTE — Progress Notes (Signed)
Patient complains of stomach discomfort for the past 2-3 months intermittently. Patient states the discomfort is random. Ibuprofen relieves some pain. Patient states chest discomfort began last year and subsided and about 3 months ago returned intermittently

## 2020-02-20 NOTE — Patient Instructions (Signed)
I encourage you to try hydroxyzine 10 mg every 8 hours as needed to help you with anxiousness during the day.  I encourage you to take your Prilosec on an every day basis and follow a GERD friendly diet.  I encourage you to use Tylenol over-the-counter to help with pain rather than ibuprofen  I encourage you to return to the mobile unit next week to review your labs and follow-up of your current symptoms.  I hope that you feel better soon  Roney Jaffe, PA-C Physician Assistant [facility] [email or web address]   Food Choices for Gastroesophageal Reflux Disease, Adult When you have gastroesophageal reflux disease (GERD), the foods you eat and your eating habits are very important. Choosing the right foods can help ease the discomfort of GERD. Consider working with a diet and nutrition specialist (dietitian) to help you make healthy food choices. What general guidelines should I follow?  Eating plan  Choose healthy foods low in fat, such as fruits, vegetables, whole grains, low-fat dairy products, and lean meat, fish, and poultry.  Eat frequent, small meals instead of three large meals each day. Eat your meals slowly, in a relaxed setting. Avoid bending over or lying down until 2-3 hours after eating.  Limit high-fat foods such as fatty meats or fried foods.  Limit your intake of oils, butter, and shortening to less than 8 teaspoons each day.  Avoid the following: ? Foods that cause symptoms. These may be different for different people. Keep a food diary to keep track of foods that cause symptoms. ? Alcohol. ? Drinking large amounts of liquid with meals. ? Eating meals during the 2-3 hours before bed.  Cook foods using methods other than frying. This may include baking, grilling, or broiling. Lifestyle  Maintain a healthy weight. Ask your health care provider what weight is healthy for you. If you need to lose weight, work  with your health care provider to do so safely.  Exercise for at least 30 minutes on 5 or more days each week, or as told by your health care provider.  Avoid wearing clothes that fit tightly around your waist and chest.  Do not use any products that contain nicotine or tobacco, such as cigarettes and e-cigarettes. If you need help quitting, ask your health care provider.  Sleep with the head of your bed raised. Use a wedge under the mattress or blocks under the bed frame to raise the head of the bed. What foods are not recommended? The items listed may not be a complete list. Talk with your dietitian about what dietary choices are best for you. Grains Pastries or quick breads with added fat. [place] toast. Vegetables Deep fried vegetables. [place] fries. Any vegetables prepared with added fat. Any vegetables that cause symptoms. For some people this may include tomatoes and tomato products, chili peppers, onions and garlic, and horseradish. Fruits Any fruits prepared with added fat. Any fruits that cause symptoms. For some people this may include citrus fruits, such as oranges, grapefruit, pineapple, and lemons. Meats and other protein foods High-fat meats, such as fatty beef or pork, hot dogs, ribs, ham, sausage, salami and bacon. Fried meat or protein, including fried fish and fried chicken. Nuts and nut butters. Dairy Whole milk and chocolate milk. Sour cream. Cream. Ice cream. Cream cheese. Milk shakes. Beverages Coffee and tea, with or without caffeine. Carbonated beverages. Sodas. Energy drinks. Fruit juice made with acidic fruits (such as orange or grapefruit). Tomato juice. Alcoholic drinks. Fats  and oils Butter. Margarine. Shortening. Ghee. Sweets and desserts Chocolate and cocoa. Donuts. Seasoning and other foods Pepper. Peppermint and spearmint. Any condiments, herbs, or seasonings that cause symptoms. For some people, this may include curry, hot sauce, or vinegar-based salad  dressings. Summary  When you have gastroesophageal reflux disease (GERD), food and lifestyle choices are very important to help ease the discomfort of GERD.  Eat frequent, small meals instead of three large meals each day. Eat your meals slowly, in a relaxed setting. Avoid bending over or lying down until 2-3 hours after eating.  Limit high-fat foods such as fatty meat or fried foods. This information is not intended to replace advice given to you by your health care provider. Make sure you discuss any questions you have with your health care provider. Document Revised: 10/05/2018 Document Reviewed: 06/15/2016 Elsevier Patient Education  2020 [employer]   Managing Anxiety, Adult After being diagnosed with an anxiety disorder, you may be relieved to know why you have felt or behaved a certain way. You may also feel overwhelmed about the treatment ahead and what it will mean for your life. With care and support, you can manage this condition and recover from it. How to manage lifestyle changes Managing stress and anxiety  Stress is your body's reaction to life changes and events, both good and bad. Most stress will last just a few hours, but stress can be ongoing and can lead to more than just stress. Although stress can play a major role in anxiety, it is not the same as anxiety. Stress is usually caused by something external, such as a deadline, test, or competition. Stress normally passes after the triggering event has ended.  Anxiety is caused by something internal, such as imagining a terrible outcome or worrying that something will go wrong that will devastate you. Anxiety often does not go away even after the triggering event is over, and it can become long-term (chronic) worry. It is important to understand the differences between stress and anxiety and to manage your stress effectively so that it does not lead to an anxious response. Talk with your health care provider or a counselor  to learn more about reducing anxiety and stress. He or she may suggest tension reduction techniques, such as:  Music therapy. This can include creating or listening to music that you enjoy and that inspires you.  Mindfulness-based meditation. This involves being aware of your normal breaths while not trying to control your breathing. It can be done while sitting or walking.  Centering prayer. This involves focusing on a word, phrase, or sacred image that means something to you and brings you peace.  Deep breathing. To do this, expand your stomach and inhale slowly through your nose. Hold your breath for 3-5 seconds. Then exhale slowly, letting your stomach muscles relax.  Self-talk. This involves identifying thought patterns that lead to anxiety reactions and changing those patterns.  Muscle relaxation. This involves tensing muscles and then relaxing them. Choose a tension reduction technique that suits your lifestyle and personality. These techniques take time and practice. Set aside 5-15 minutes a day to do them. Therapists can offer counseling and training in these techniques. The training to help with anxiety may be covered by some insurance plans. Other things you can do to manage stress and anxiety include:  Keeping a stress/anxiety diary. This can help you learn what triggers your reaction and then learn ways to manage your response.  Thinking about how you react to  certain situations. You may not be able to control everything, but you can control your response.  Making time for activities that help you relax and not feeling guilty about spending your time in this way.  Visual imagery and yoga can help you stay calm and relax.  Medicines Medicines can help ease symptoms. Medicines for anxiety include:  Anti-anxiety drugs.  Antidepressants. Medicines are often used as a primary treatment for anxiety disorder. Medicines will be prescribed by a health care provider. When used  together, medicines, psychotherapy, and tension reduction techniques may be the most effective treatment. Relationships Relationships can play a big part in helping you recover. Try to spend more time connecting with trusted friends and family members. Consider going to couples counseling, taking family education classes, or going to family therapy. Therapy can help you and others better understand your condition. How to recognize changes in your anxiety Everyone responds differently to treatment for anxiety. Recovery from anxiety happens when symptoms decrease and stop interfering with your daily activities at home or work. This may mean that you will start to:  Have better concentration and focus. Worry will interfere less in your daily thinking.  Sleep better.  Be less irritable.  Have more energy.  Have improved memory. It is important to recognize when your condition is getting worse. Contact your health care provider if your symptoms interfere with home or work and you feel like your condition is not improving. Follow these instructions at home: Activity  Exercise. Most adults should do the following: ? Exercise for at least 150 minutes each week. The exercise should increase your heart rate and make you sweat (moderate-intensity exercise). ? Strengthening exercises at least twice a week.  Get the right amount and quality of sleep. Most adults need 7-9 hours of sleep each night. Lifestyle   Eat a healthy diet that includes plenty of vegetables, fruits, whole grains, low-fat dairy products, and lean protein. Do not eat a lot of foods that are high in solid fats, added sugars, or salt.  Make choices that simplify your life.  Do not use any products that contain nicotine or tobacco, such as cigarettes, e-cigarettes, and chewing tobacco. If you need help quitting, ask your health care provider.  Avoid caffeine, alcohol, and certain over-the-counter cold medicines. These may make  you feel worse. Ask your pharmacist which medicines to avoid. General instructions  Take over-the-counter and prescription medicines only as told by your health care provider.  Keep all follow-up visits as told by your health care provider. This is important. Where to find support You can get help and support from these sources:  Self-help groups.  Online and [employer].  A trusted spiritual leader.  Couples counseling.  Family education classes.  Family therapy. Where to find more information You may find that joining a support group helps you deal with your anxiety. The following sources can help you locate counselors or support groups near you:  Mental Health America: www.mentalhealthamerica.net  Anxiety and Depression Association of [place] (ADAA): ProgramCam.de  [employer]ness (NAMI): www.nami.org Contact a health care provider if you:  Have a hard time staying focused or finishing daily tasks.  Spend many hours a day feeling worried about everyday life.  Become exhausted by worry.  Start to have headaches, feel tense, or have nausea.  Urinate more than normal.  Have diarrhea. Get help right away if you have:  A racing heart and shortness of breath.  Thoughts of hurting yourself  or others. If you ever feel like you may hurt yourself or others, or have thoughts about taking your own life, get help right away. You can go to your nearest emergency department or call:  Your local emergency services (911 in the [place]  A suicide crisis helpline, such as the National Suicide Prevention Lifeline at (607) 647-2621. This is open 24 hours a day. Summary  Taking steps to learn and use tension reduction techniques can help calm you and help prevent triggering an anxiety reaction.  When used together, medicines, psychotherapy, and tension reduction techniques may be the most effective treatment.  Family, friends, and partners can play  a big part in helping you recover from an anxiety disorder. This information is not intended to replace advice given to you by your health care provider. Make sure you discuss any questions you have with your health care provider. Document Revised: 11/14/2018 Document Reviewed: 11/14/2018 Elsevier Patient Education  2020 [employer].
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
## 2020-02-21 ENCOUNTER — Encounter: Payer: Self-pay | Admitting: Physician Assistant

## 2020-02-21 LAB — CERVICOVAGINAL ANCILLARY ONLY
Bacterial Vaginitis (gardnerella): POSITIVE — AB
Chlamydia: NEGATIVE
Comment: NEGATIVE
Comment: NEGATIVE
Comment: NEGATIVE
Comment: NORMAL
Neisseria Gonorrhea: NEGATIVE
Trichomonas: NEGATIVE

## 2020-02-21 MED ORDER — METRONIDAZOLE 500 MG PO TABS: 500.0000 mg | ORAL_TABLET | Freq: Two times a day (BID) | ORAL | 0 refills | Status: AC

## 2020-02-21 MED ORDER — FLUCONAZOLE 150 MG PO TABS: 150.0000 mg | ORAL_TABLET | Freq: Once | ORAL | 0 refills | Status: DC

## 2020-02-21 NOTE — Telephone Encounter (Signed)
-----   Message from Roney Jaffe, [place] sent at 02/21/2020 10:43 AM EDT ----- Please call patient and let her know that her test did come back positive for bacterial vaginitis, she will need to take metronidazole twice a day for the next 7 days, prescription sent to her pharmacy.  Rest of labs are still pending

## 2020-02-22 LAB — CBC WITH DIFFERENTIAL/PLATELET
Basophils Absolute: 0.1 10*3/uL (ref 0.0–0.2)
Basos: 1 %
EOS (ABSOLUTE): 0.1 10*3/uL (ref 0.0–0.4)
Eos: 2 %
Hematocrit: 36.5 % (ref 34.0–46.6)
Hemoglobin: 11.7 g/dL (ref 11.1–15.9)
Immature Grans (Abs): 0 10*3/uL (ref 0.0–0.1)
Immature Granulocytes: 0 %
Lymphocytes Absolute: 2.1 10*3/uL (ref 0.7–3.1)
Lymphs: 40 %
MCH: 24.3 pg — ABNORMAL LOW (ref 26.6–33.0)
MCHC: 32.1 g/dL (ref 31.5–35.7)
MCV: 76 fL — ABNORMAL LOW (ref 79–97)
Monocytes Absolute: 0.6 10*3/uL (ref 0.1–0.9)
Monocytes: 11 %
Neutrophils Absolute: 2.4 10*3/uL (ref 1.4–7.0)
Neutrophils: 46 %
Platelets: 215 10*3/uL (ref 150–450)
RBC: 4.82 x10E6/uL (ref 3.77–5.28)
RDW: 15.8 % — ABNORMAL HIGH (ref 11.7–15.4)
WBC: 5.2 10*3/uL (ref 3.4–10.8)

## 2020-02-22 LAB — IRON,TIBC AND FERRITIN PANEL
Ferritin: 35 ng/mL (ref 15–150)
Iron Saturation: 12 % — ABNORMAL LOW (ref 15–55)
Iron: 36 ug/dL (ref 27–159)
Total Iron Binding Capacity: 297 ug/dL (ref 250–450)
UIBC: 261 ug/dL (ref 131–425)

## 2020-02-22 LAB — VITAMIN D 25 HYDROXY (VIT D DEFICIENCY, FRACTURES): Vit D, 25-Hydroxy: 27.3 ng/mL — ABNORMAL LOW (ref 30.0–100.0)

## 2020-02-22 LAB — H PYLORI, IGM, IGG, IGA AB
H pylori, IgM Abs: <9 units (ref 0.0–8.9)
H. pylori, IgA Abs: 20.9 units — ABNORMAL HIGH (ref 0.0–8.9)
H. pylori, IgG AbS: 0.53 Index Value (ref 0.00–0.79)

## 2020-02-25 NOTE — Progress Notes (Signed)
Patient was made aware of STI results last week. Still awaiting Blood work results for follow up

## 2020-02-27 ENCOUNTER — Ambulatory Visit: Payer: Medicaid Other | Admitting: Physician Assistant

## 2020-02-27 ENCOUNTER — Other Ambulatory Visit: Payer: Self-pay

## 2020-02-27 VITALS — BP 110/82 | HR 74 | Temp 98.7°F | Resp 18

## 2020-02-27 DIAGNOSIS — A048 Other specified bacterial intestinal infections: Secondary | ICD-10-CM | POA: Diagnosis not present

## 2020-02-27 DIAGNOSIS — E559 Vitamin D deficiency, unspecified: Secondary | ICD-10-CM

## 2020-02-27 DIAGNOSIS — K219 Gastro-esophageal reflux disease without esophagitis: Secondary | ICD-10-CM

## 2020-02-27 MED ORDER — AMOXICILLIN 500 MG PO CAPS: 1000.0000 mg | ORAL_CAPSULE | Freq: Two times a day (BID) | ORAL | 0 refills | Status: AC

## 2020-02-27 MED ORDER — CLARITHROMYCIN 500 MG PO TABS: 500.0000 mg | ORAL_TABLET | Freq: Two times a day (BID) | ORAL | 0 refills | Status: AC

## 2020-02-27 MED ORDER — OMEPRAZOLE 40 MG PO CPDR: 40.0000 mg | DELAYED_RELEASE_CAPSULE | Freq: Every day | ORAL | 1 refills | Status: DC

## 2020-02-27 NOTE — Progress Notes (Signed)
Patient complains of pain in the abdomen. Patient is completing BV treatment. Patient has eaten and taken medication today

## 2020-02-27 NOTE — Patient Instructions (Addendum)
You will finish your dosing of metronidazole for your bacterial vaginitis.  For H. pylori, you will take clarithromycin 500 mg twice a day, amoxicillin 1 g twice a day and you will continue the omeprazole 40 mg once a day.  Please take vit D 2,000 units over the counter once a day  Please let us know if there is anything else we can do for you  Roney Jaffe, PA-C Physician Assistant [facility] [email or web address]    Helicobacter Pylori Infection Helicobacter pylori infection is a bacterial infection in the stomach. Long-term (chronic) infection can cause stomach irritation (gastritis), ulcers in the stomach (gastric ulcers), and ulcers in the upper part of the intestine (duodenal ulcers). Having this infection may also increase your risk of stomach cancer and a type of white blood cell cancer (lymphoma) that affects the stomach. What are the causes? This infection is caused by the Helicobacter pylori (H. pylori) bacteria. Many healthy people have this bacteria in their stomach lining. The bacteria may also spread from person to person through contact with stool (feces) or saliva. It is not known why some people develop ulcers, gastritis, or cancer from the bacteria. What increases the risk? You are more likely to develop this condition if you:  Have family members with the infection.  Live with many other people, such as in a dormitory.  Are of African, Hispanic, or Asian descent. What are the signs or symptoms? Most people with this infection do not have any symptoms. If you do have symptoms, they may include:  Heartburn.  Stomach pain.  Nausea.  Vomiting. The vomit may be bloody because of ulcers.  Loss of appetite.  Bad breath. How is this diagnosed? This condition may be diagnosed based on:  Your symptoms and medical history.  A physical exam.  Blood tests.  Stool tests.  A breath test.  A procedure  that involves placing a tube with a camera on the end of it down your throat to examine your stomach and upper intestine (upper endoscopy).  Removing and testing a tissue sample from the stomach lining (biopsy). A biopsy may be taken during an upper endoscopy. How is this treated?  This condition is treated by taking a combination of medicines (triple therapy) for several weeks. Triple therapy includes one medicine to reduce the amount of acid in your stomach and two types of antibiotic medicines. This treatment may reduce your risk of cancer. You may need to be tested for H. pylori again after treatment. In some cases, the treatment may need to be repeated if your treatment did not get rid of all the bacteria. Follow these instructions at home:   Take over-the-counter and prescription medicines only as told by your health care provider.  Take your antibiotics as told by your health care provider. Do not stop taking the antibiotics even if you start to feel better.  Return to your normal activities as told by your health care provider. Ask your health care provider what activities are safe for you.  Take steps to prevent future infections: ? Wash your hands often with soap and water. If soap and water are not available, use hand sanitizer. ? Do not eat food or drink water that may have had contact with stool or saliva.  Keep all follow-up visits as told by your health care provider. This is important. You may need tests to make sure your treatment worked. Contact a health care provider if your symptoms:  Do not get  better with treatment.  Return after treatment. Summary  Helicobacter pylori infection is a stomach infection caused by the Helicobacter pylori (H. pylori) bacteria.  This infection can cause stomach irritation (gastritis), ulcers in the stomach (gastric ulcers), and ulcers in the upper part of the intestine (duodenal ulcers).  This condition is treated by taking a  combination of medicines (triple therapy) for several weeks.  Take your antibiotics as told by your health care provider. Do not stop taking the antibiotics even if you start to feel better. This information is not intended to replace advice given to you by your health care provider. Make sure you discuss any questions you have with your health care provider. Document Revised: 10/05/2018 Document Reviewed: 06/07/2017 Elsevier Patient Education  2020 [employer]   Vitamin D Deficiency Vitamin D deficiency is when your body does not have enough vitamin D. Vitamin D is important to your body for many reasons:  It helps the body absorb two important minerals--calcium and phosphorus.  It plays a role in bone health.  It may help to prevent some diseases, such as diabetes and multiple sclerosis.  It plays a role in muscle function, including heart function. If vitamin D deficiency is severe, it can cause a condition in which your bones become soft. In adults, this condition is called osteomalacia. In children, this condition is called rickets. What are the causes? This condition may be caused by:  Not eating enough foods that contain vitamin D.  Not getting enough natural sun exposure.  Having certain digestive system diseases that make it difficult for your body to absorb vitamin D. These diseases include Crohn's disease, chronic pancreatitis, and cystic fibrosis.  Having a surgery in which a part of the stomach or a part of the small intestine is removed.  Having chronic kidney disease or liver disease. What increases the risk? You are more likely to develop this condition if you:  Are older.  Do not spend much time outdoors.  Live in a long-term care facility.  Have had broken bones.  Have weak or thin bones (osteoporosis).  Have a disease or condition that changes how the body absorbs vitamin D.  Have dark skin.  Take certain medicines, such as steroid medicines or  certain seizure medicines.  Are overweight or obese. What are the signs or symptoms? In mild cases of vitamin D deficiency, there may not be any symptoms. If the condition is severe, symptoms may include:  Bone pain.  Muscle pain.  Falling often.  Broken bones caused by a minor injury. How is this diagnosed? This condition may be diagnosed with blood tests. Imaging tests such as X-rays may also be done to look for changes in the bone. How is this treated? Treatment for this condition may depend on what caused the condition. Treatment options include:  Taking vitamin D supplements. Your health care provider will suggest what dose is best for you.  Taking a calcium supplement. Your health care provider will suggest what dose is best for you. Follow these instructions at home: Eating and drinking   Eat foods that contain vitamin D. Choices include: ? Fortified dairy products, cereals, or juices. Fortified means that vitamin D has been added to the food. Check the label on the package to see if the food is fortified. ? Fatty fish, such as salmon or trout. ? Eggs. ? Oysters. ? Mushrooms. The items listed above may not be a complete list of recommended foods and beverages. Contact a dietitian  for more information. General instructions  Take medicines and supplements only as told by your health care provider.  Get regular, safe exposure to natural sunlight.  Do not use a tanning bed.  Maintain a healthy weight. Lose weight if needed.  Keep all follow-up visits as told by your health care provider. This is important. How is this prevented? You can get vitamin D by:  Eating foods that naturally contain vitamin D.  Eating or drinking products that have been fortified with vitamin D, such as cereals, juices, and dairy products (including milk).  Taking a vitamin D supplement or a multivitamin supplement that contains vitamin D.  Being in the sun. Your body naturally makes  vitamin D when your skin is exposed to sunlight. Your body changes the sunlight into a form of the vitamin that it can use. Contact a health care provider if:  Your symptoms do not go away.  You feel nauseous or you vomit.  You have fewer bowel movements than usual or are constipated. Summary  Vitamin D deficiency is when your body does not have enough vitamin D.  Vitamin D is important to your body for good bone health and muscle function, and it may help prevent some diseases.  Vitamin D deficiency is primarily treated through supplementation. Your health care provider will suggest what dose is best for you.  You can get vitamin D by eating foods that contain vitamin D, by being in the sun, and by taking a vitamin D supplement or a multivitamin supplement that contains vitamin D. This information is not intended to replace advice given to you by your health care provider. Make sure you discuss any questions you have with your health care provider. Document Revised: 02/20/2018 Document Reviewed: 02/20/2018 Elsevier Patient Education  2020 [employer].

## 2020-02-27 NOTE — Progress Notes (Signed)
Established Patient Office Visit  Subjective:  Patient ID: Ann Clark, female    DOB: 1985-12-06  Age: 34 y.o. MRN: [id number]  CC:  Chief Complaint  Patient presents with  . Follow-up    HPI Ann Clark reports that she has been taking the metronidazole as directed, states that she is starting to have some relief.  Reports that she continues to have epigastric burning, has been taking the Prilosec as directed.  No new concerns at this time   Past Medical History:  Diagnosis Date  . Anemia   . Anxiety   . Preterm labor   . Shoulder pain   . Trichomonas infection 08/01/2015    Past Surgical History:  Procedure Laterality Date  . EUSTACHIAN TUBE DILATION    . TONSILLECTOMY      Family History  Problem Relation Age of Onset  . COPD Mother   . Diabetes Mother   . Hyperlipidemia Mother   . Hypertension Mother   . Anxiety disorder Mother   . Arthritis Mother   . Asthma Mother   . Depression Mother   . Drug abuse Mother   . Vision loss Mother   . Kidney disease Father   . Drug abuse Father   . Heart Problems Father   . ADD / ADHD Sister   . Heart disease Sister   . Hearing loss Son     Social History   Socioeconomic History  . Marital status: Single    Spouse name: Not on file  . Number of children: Not on file  . Years of education: Not on file  . Highest education level: Not on file  Occupational History  . Not on file  Tobacco Use  . Smoking status: Light Tobacco Smoker    Packs/day: 0.25    Types: Cigarettes  . Smokeless tobacco: Never Used  . Tobacco comment: occasional  Vaping Use  . Vaping Use: Never used  Substance and Sexual Activity  . Alcohol use: Yes    Alcohol/week: 2.0 - 3.0 standard drinks    Types: 2 - 3 Shots of liquor per week    Comment: two days weekly  . Drug use: Not Currently    Types: Marijuana    Comment: 4 times monthly  . Sexual activity: Yes  Other Topics Concern  . Not on file  Social History Narrative  .  Not on file   Social Determinants of Health   Financial Resource Strain:   . Difficulty of Paying Living Expenses: Not on file  Food Insecurity:   . Worried About Charity fundraiser in the Last Year: Not on file  . Ran Out of Food in the Last Year: Not on file  Transportation Needs:   . Lack of Transportation (Medical): Not on file  . Lack of Transportation (Non-Medical): Not on file  Physical Activity:   . Days of Exercise per Week: Not on file  . Minutes of Exercise per Session: Not on file  Stress:   . Feeling of Stress : Not on file  Social Connections:   . Frequency of Communication with Friends and Family: Not on file  . Frequency of Social Gatherings with Friends and Family: Not on file  . Attends Religious Services: Not on file  . Active Member of Clubs or Organizations: Not on file  . Attends Archivist Meetings: Not on file  . Marital Status: Not on file  Intimate Partner Violence:   . Fear of  Current or Ex-Partner: Not on file  . Emotionally Abused: Not on file  . Physically Abused: Not on file  . Sexually Abused: Not on file    Outpatient Medications Prior to Visit  Medication Sig Dispense Refill  . Blood Pressure Monitoring (BLOOD PRESSURE KIT) DEVI 1 kit by Does not apply route once a week. Check BP regularly and record readings into the [employer] App.  Large Cuff. DX O90.0 1 each 0  . diphenhydrAMINE HCl (BENADRYL ALLERGY PO) Take by mouth.    . diphenhydramine-acetaminophen (TYLENOL PM) 25-500 MG TABS tablet Take 1-2 tablets by mouth at bedtime as needed (sleep).     . hydrOXYzine (ATARAX/VISTARIL) 10 MG tablet Take 1 tablet (10 mg total) by mouth 3 (three) times daily as needed. 30 tablet 0  . ibuprofen (ADVIL) 600 MG tablet Take 1 tablet (600 mg total) by mouth every 8 (eight) hours as needed. 30 tablet 1  . metroNIDAZOLE (FLAGYL) 500 MG tablet Take 1 tablet (500 mg total) by mouth 2 (two) times daily for 7 days. 14 tablet 0  . tiZANidine (ZANAFLEX) 4  MG tablet 1/2 tablet every 8 hours as needed for muscle spasm and may take 1 whole tablet at bedtime if needed 20 tablet 0  . Vitamin D, Ergocalciferol, (DRISDOL) 1.25 MG (50000 UNIT) CAPS capsule 1 pill, once per week 12 capsule 1  . omeprazole (PRILOSEC) 40 MG capsule Take 1 capsule (40 mg total) by mouth daily. 90 capsule 1   No facility-administered medications prior to visit.    Allergies  Allergen Reactions  . No Known Allergies     ROS Review of Systems  Constitutional: Negative for chills, fatigue and fever.  HENT: Negative.   Eyes: Negative.   Respiratory: Negative.   Cardiovascular: Negative for chest pain.  Gastrointestinal: Positive for abdominal pain. Negative for diarrhea, nausea and vomiting.  Endocrine: Negative.   Genitourinary: Negative.   Musculoskeletal: Negative.   Skin: Negative.   Allergic/Immunologic: Negative.   Neurological: Negative.   Hematological: Negative.       Objective:    Physical Exam Vitals and nursing note reviewed.  Constitutional:      Appearance: Normal appearance.  HENT:     Head: Normocephalic and atraumatic.     Right Ear: External ear normal.     Left Ear: External ear normal.     Nose: Nose normal.     Mouth/Throat:     Mouth: Mucous membranes are moist.     Pharynx: Oropharynx is clear.  Eyes:     Extraocular Movements: Extraocular movements intact.     Conjunctiva/sclera: Conjunctivae normal.     Pupils: Pupils are equal, round, and reactive to light.  Cardiovascular:     Rate and Rhythm: Normal rate and regular rhythm.     Pulses: Normal pulses.     Heart sounds: Normal heart sounds.  Pulmonary:     Effort: Pulmonary effort is normal.     Breath sounds: Normal breath sounds.  Abdominal:     General: Abdomen is flat.     Palpations: Abdomen is soft.     Tenderness: There is abdominal tenderness. There is no right CVA tenderness or left CVA tenderness.  Musculoskeletal:        General: Normal range of motion.      Cervical back: Normal range of motion and neck supple.  Skin:    General: Skin is warm and dry.  Neurological:     General: No focal deficit present.  Mental Status: She is alert and oriented to person, place, and time. Mental status is at baseline.     BP 110/82 (BP Location: Left Arm, Patient Position: Sitting, Cuff Size: Normal)   Pulse 74   Temp 98.7 F (37.1 C) (Oral)   Resp 18   LMP 02/11/2020   SpO2 100%  Wt Readings from Last 3 Encounters:  01/07/20 169 lb 6.4 oz (76.8 kg)  11/07/19 171 lb (77.6 kg)  03/01/19 160 lb (72.6 kg)     Health Maintenance Due  Topic Date Due  . COVID-19 Vaccine (1) Never done  . INFLUENZA VACCINE  Never done    There are no preventive care reminders to display for this patient.  Lab Results  Component Value Date   TSH 0.647 11/07/2019   Lab Results  Component Value Date   WBC 5.2 02/20/2020   HGB 11.7 02/20/2020   HCT 36.5 02/20/2020   MCV 76 (L) 02/20/2020   PLT 215 02/20/2020   Lab Results  Component Value Date   NA 138 02/15/2020   K 3.5 02/15/2020   CO2 21 (L) 02/15/2020   GLUCOSE 81 02/15/2020   BUN 9 02/15/2020   CREATININE 0.76 02/15/2020   BILITOT 0.7 02/15/2020   ALKPHOS 43 02/15/2020   AST 15 02/15/2020   ALT 10 02/15/2020   PROT 7.1 02/15/2020   ALBUMIN 4.1 02/15/2020   CALCIUM 9.0 02/15/2020   ANIONGAP 10 02/15/2020   Lab Results  Component Value Date   CHOL 158 01/10/2017   Lab Results  Component Value Date   HDL 54 01/10/2017   Lab Results  Component Value Date   LDLCALC 92 01/10/2017   Lab Results  Component Value Date   TRIG 62 01/10/2017   Lab Results  Component Value Date   CHOLHDL 2.9 01/10/2017   Lab Results  Component Value Date   HGBA1C 5.2 01/07/2020      Assessment & Plan:   Problem List Items Addressed This Visit      Digestive   GERD (gastroesophageal reflux disease)   Relevant Medications   omeprazole (PRILOSEC) 40 MG capsule     Other   H. pylori  infection - Primary   Relevant Medications   clarithromycin (BIAXIN) 500 MG tablet   amoxicillin (AMOXIL) 500 MG capsule   Vitamin D deficiency    1. H. pylori infection Patient reports she had never been treated for H. pylori previously, given current symptoms, will treat.  Patient education given on H. pylori, encouraged GERD lifestyle modifications  - clarithromycin (BIAXIN) 500 MG tablet; Take 1 tablet (500 mg total) by mouth 2 (two) times daily for 14 days.  Dispense: 28 tablet; Refill: 0 - amoxicillin (AMOXIL) 500 MG capsule; Take 2 capsules (1,000 mg total) by mouth 2 (two) times daily for 14 days.  Dispense: 56 capsule; Refill: 0  2. Gastroesophageal reflux disease without esophagitis  - omeprazole (PRILOSEC) 40 MG capsule; Take 1 capsule (40 mg total) by mouth daily.  Dispense: 90 capsule; Refill: 1  3. Vit D Def Patient education given, encouraged 2000 units over-the-counter on a daily basis  Patient to call and [facility], patient encouraged to return to mobile medicine unit for follow-up if needed  Meds ordered this encounter  Medications  . clarithromycin (BIAXIN) 500 MG tablet    Sig: Take 1 tablet (500 mg total) by mouth 2 (two) times daily for 14 days.    Dispense:  28  tablet    Refill:  0    Order Specific Question:   Supervising Provider    Answer:   Asencion Noble E [[id number]]  . amoxicillin (AMOXIL) 500 MG capsule    Sig: Take 2 capsules (1,000 mg total) by mouth 2 (two) times daily for 14 days.    Dispense:  56 capsule    Refill:  0    Order Specific Question:   Supervising Provider    Answer:   Asencion Noble E [[id number]]  . omeprazole (PRILOSEC) 40 MG capsule    Sig: Take 1 capsule (40 mg total) by mouth daily.    Dispense:  90 capsule    Refill:  1    Order Specific Question:   Supervising Provider    Answer:   Elsie Stain [[id number]]    Follow-up: Return if symptoms worsen or fail to improve.     I have reviewed the patient's medical history (PMH, PSH, Social History, Family History, Medications, and allergies) , and have been updated if relevant. I spent 20 minutes reviewing chart and  face to face time with patient.      Loraine Grip Mayers, PA-C

## 2020-03-13 ENCOUNTER — Ambulatory Visit: Payer: Medicaid Other | Admitting: Nurse Practitioner

## 2020-03-28 ENCOUNTER — Telehealth: Payer: Self-pay | Admitting: Oncology

## 2020-03-28 ENCOUNTER — Other Ambulatory Visit: Payer: Self-pay | Admitting: Oncology

## 2020-03-28 ENCOUNTER — Encounter: Payer: Self-pay | Admitting: Oncology

## 2020-03-28 DIAGNOSIS — U071 COVID-19: Secondary | ICD-10-CM

## 2020-03-28 NOTE — Progress Notes (Signed)
I connected by phone with  Ann Clark to discuss the potential use of an new treatment for mild to moderate COVID-19 viral infection in non-hospitalized patients.   This patient is a age/sex that meets the FDA criteria for Emergency Use Authorization of casirivimab\imdevimab.  Has a (+) direct SARS-CoV-2 viral test result 1. Has mild or moderate COVID-19  2. Is ? 36 years of age and weighs ? 40 kg 3. Is NOT hospitalized due to COVID-19 4. Is NOT requiring oxygen therapy or requiring an increase in baseline oxygen flow rate due to COVID-19 5. Is within 10 days of symptom onset 6. Has at least one of the high risk factor(s) for progression to severe COVID-19 and/or hospitalization as defined in EUA. Specific high risk criteria : Past Medical History:  Diagnosis Date  . Anemia   . Anxiety   . Preterm labor   . Shoulder pain   . Trichomonas infection 08/01/2015  ? obesity  ?    Symptom onset  03/25/20   I have spoken and communicated the following to the patient or parent/caregiver:   1. FDA has authorized the emergency use of casirivimab\imdevimab for the treatment of mild to moderate COVID-19 in adults and pediatric patients with positive results of direct SARS-CoV-2 viral testing who are 42 years of age and older weighing at least 40 kg, and who are at high risk for progressing to severe COVID-19 and/or hospitalization.   2. The significant known and potential risks and benefits of casirivimab\imdevimab, and the extent to which such potential risks and benefits are unknown.   3. Information on available alternative treatments and the risks and benefits of those alternatives, including clinical trials.   4. Patients treated with casirivimab\imdevimab should continue to self-isolate and use infection control measures (e.g., wear mask, isolate, social distance, avoid sharing personal items, clean and disinfect "high touch" surfaces, and frequent handwashing) according to CDC guidelines.     5. The patient or parent/caregiver has the option to accept or refuse casirivimab\imdevimab .   After reviewing this information with the patient, The patient agreed to proceed with receiving casirivimab\imdevimab infusion and will be provided a copy of the Fact sheet prior to receiving the infusion.[name], AGNP-C [phone] (Infusion Center Hotline)

## 2020-03-28 NOTE — Telephone Encounter (Signed)
Re: [facility]  Called to Discuss with patient about Covid symptoms and the use of regeneron, a monoclonal antibody infusion for those with mild to moderate Covid symptoms and at a high risk of hospitalization.     Pt is qualified for this infusion at the [facility] due to co-morbid conditions and/or a member of an at-risk group.    Past Medical History:  Diagnosis Date  . Anemia   . Anxiety   . Preterm labor   . Shoulder pain   . Trichomonas infection 08/01/2015    Specific risk condition- Smoker   Unable to reach pt. Left VM and MCM.  [name], AGNP-C [phone] (Infusion Center Hotline)

## 2020-03-29 ENCOUNTER — Ambulatory Visit (HOSPITAL_COMMUNITY): Payer: Medicaid Other

## 2020-06-06 ENCOUNTER — Ambulatory Visit: Payer: Self-pay

## 2020-06-06 NOTE — Telephone Encounter (Signed)
Patient called and says she's been having off and on lower abdominal pain for the past month that she associated with her menstrual cycle, but her LMP ended 2 days ago and she's still having abdominal pain. She rates the pain a 7-8 and it's mainly in the pelvic area. She denies radiation of the pain. She says she takes ibuprofen and it eases the pain, but doesn't take the pain away. She asks to be seen by a female provider. Appointment scheduled for Tuesday, 06/10/20 at 0930 with Cari Mayers, PA-C, care advice given, patient verbalized understanding.   Reason for Disposition . Abdominal pain is a chronic symptom (recurrent or ongoing AND present > 4 weeks)  Answer Assessment - Initial Assessment Questions 1. LOCATION: "Where does it hurt?"      Lower abdomen 2. RADIATION: "Does the pain shoot anywhere else?" (e.g., chest, back)     No 3. ONSET: "When did the pain begin?" (e.g., minutes, hours or days ago)      1 month ago 4. SUDDEN: "Gradual or sudden onset?"     Both 5. PATTERN "Does the pain come and go, or is it constant?"    - If constant: "Is it getting better, staying the same, or worsening?"      (Note: Constant means the pain never goes away completely; most serious pain is constant and it progresses)     - If intermittent: "How long does it last?" "Do you have pain now?"     (Note: Intermittent means the pain goes away completely between bouts)     Off and on 6. SEVERITY: "How bad is the pain?"  (e.g., Scale 1-10; mild, moderate, or severe)   - MILD (1-3): doesn't interfere with normal activities, abdomen soft and not tender to touch    - MODERATE (4-7): interferes with normal activities or awakens from sleep, tender to touch    - SEVERE (8-10): excruciating pain, doubled over, unable to do any normal activities      7 7. RECURRENT SYMPTOM: "Have you ever had this type of stomach pain before?" If Yes, ask: "When was the last time?" and "What happened that time?"      Yes, take  ibuprofen 8. CAUSE: "What do you think is causing the stomach pain?"     I don't know 9. RELIEVING/AGGRAVATING FACTORS: "What makes it better or worse?" (e.g., movement, antacids, bowel movement)     Ibuprofen eases 10. OTHER SYMPTOMS: "Has there been any vomiting, diarrhea, constipation, or urine problems?"      No 11. PREGNANCY: "Is there any chance you are pregnant?" "When was your last menstrual period?"      Possibly, no on BC; LMP 06/01/20  Protocols used: ABDOMINAL PAIN - [facility]

## 2020-06-10 ENCOUNTER — Ambulatory Visit: Payer: Medicaid Other | Attending: Physician Assistant | Admitting: Physician Assistant

## 2020-06-10 ENCOUNTER — Other Ambulatory Visit (HOSPITAL_COMMUNITY)
Admission: RE | Admit: 2020-06-10 | Discharge: 2020-06-10 | Disposition: A | Discharge status: Home / Self Care | Payer: Medicaid Other | Source: Ambulatory Visit | Attending: Physician Assistant | Admitting: Physician Assistant

## 2020-06-10 ENCOUNTER — Other Ambulatory Visit: Payer: Self-pay | Admitting: Physician Assistant

## 2020-06-10 ENCOUNTER — Other Ambulatory Visit: Payer: Self-pay

## 2020-06-10 VITALS — BP 119/83 | HR 86 | Temp 98.7°F | Resp 18 | Ht 61.0 in | Wt 154.0 lb

## 2020-06-10 DIAGNOSIS — N760 Acute vaginitis: Secondary | ICD-10-CM | POA: Insufficient documentation

## 2020-06-10 DIAGNOSIS — F319 Bipolar disorder, unspecified: Secondary | ICD-10-CM

## 2020-06-10 DIAGNOSIS — N3000 Acute cystitis without hematuria: Secondary | ICD-10-CM | POA: Diagnosis not present

## 2020-06-10 DIAGNOSIS — B9689 Other specified bacterial agents as the cause of diseases classified elsewhere: Secondary | ICD-10-CM

## 2020-06-10 DIAGNOSIS — F411 Generalized anxiety disorder: Secondary | ICD-10-CM

## 2020-06-10 DIAGNOSIS — K219 Gastro-esophageal reflux disease without esophagitis: Secondary | ICD-10-CM

## 2020-06-10 DIAGNOSIS — E559 Vitamin D deficiency, unspecified: Secondary | ICD-10-CM

## 2020-06-10 LAB — POCT URINALYSIS DIP (CLINITEK)
Bilirubin, UA: NEGATIVE
Blood, UA: NEGATIVE
Glucose, UA: NEGATIVE mg/dL
Ketones, POC UA: NEGATIVE mg/dL
Nitrite, UA: NEGATIVE
POC PROTEIN,UA: 30 — AB
Spec Grav, UA: 1.025 (ref 1.010–1.025)
Urobilinogen, UA: 0.2 E.U./dL
pH, UA: 7 (ref 5.0–8.0)

## 2020-06-10 MED ORDER — HYDROXYZINE HCL 10 MG PO TABS: 10.0000 mg | ORAL_TABLET | Freq: Three times a day (TID) | ORAL | 0 refills | Status: DC | PRN

## 2020-06-10 MED ORDER — VITAMIN D (ERGOCALCIFEROL) 1.25 MG (50000 UNIT) PO CAPS: 50000.0000 [IU] | ORAL_CAPSULE | ORAL | 2 refills | Status: DC

## 2020-06-10 MED ORDER — OMEPRAZOLE 40 MG PO CPDR: 40.0000 mg | DELAYED_RELEASE_CAPSULE | Freq: Every day | ORAL | 1 refills | Status: DC

## 2020-06-10 MED ORDER — NITROFURANTOIN MONOHYD MACRO 100 MG PO CAPS: 100.0000 mg | ORAL_CAPSULE | Freq: Two times a day (BID) | ORAL | 0 refills | Status: DC

## 2020-06-10 MED ORDER — ARIPIPRAZOLE 5 MG PO TABS: 5.0000 mg | ORAL_TABLET | Freq: Every day | ORAL | 1 refills | Status: DC

## 2020-06-10 MED FILL — NITROFURANTOIN MONO-MCR 100: 100 | 5 days supply | Qty: 10 | Fill #0

## 2020-06-10 MED FILL — ARIPiprazole 5 MG TABS: 5 | 30 days supply | Qty: 30 | Fill #0

## 2020-06-10 MED FILL — OMEPRAZOLE DR 40 MG CAPSULE: 40 | 30 days supply | Qty: 30 | Fill #0

## 2020-06-10 MED FILL — hydrOXYzine HCL 10 MG TABS: 10 | 10 days supply | Qty: 30 | Fill #0

## 2020-06-10 MED FILL — VIT D2 1.25 MG (50,000 UNIT: 1.25 MG | 28 days supply | Qty: 4 | Fill #0

## 2020-06-10 NOTE — Patient Instructions (Addendum)
Your urinalysis did show that you currently have a urinary tract infection.  You will take Macrobid twice a day for the next 5 days.  Make sure you are drinking plenty of water.  For your bipolar disorder, I encourage you to start Abilify 5 mg at bedtime.  If this does not help resolve your insomnia.  I encourage you to take 10 to 20 mg of hydroxyzine along with the Abilify at bedtime.  You can use hydroxyzine 10 mg up to 3 times a day to help with anxiety.  I have started a referral for you to be seen by psychiatry to help you with medication management.  I encourage you to resume omeprazole and limit intake of ibuprofen.  Please return to the mobile medicine unit in 2 weeks for follow-up.  I hope that you feel better soon  Roney Jaffe, PA-C Physician Assistant [facility] [email or web address]    Urinary Tract Infection, Adult  A urinary tract infection (UTI) is an infection of any part of the urinary tract. The urinary tract includes the kidneys, ureters, bladder, and urethra. These organs make, store, and get rid of urine in the body. Your health care provider may use other names to describe the infection. An upper UTI affects the ureters and kidneys (pyelonephritis). A lower UTI affects the bladder (cystitis) and urethra (urethritis). What are the causes? Most urinary tract infections are caused by bacteria in your genital area, around the entrance to your urinary tract (urethra). These bacteria grow and cause inflammation of your urinary tract. What increases the risk? You are more likely to develop this condition if:  You have a urinary catheter that stays in place (indwelling).  You are not able to control when you urinate or have a bowel movement (you have incontinence).  You are female and you: ? Use a spermicide or diaphragm for birth control. ? Have low estrogen levels. ? Are pregnant.  You have certain genes that  increase your risk (genetics).  You are sexually active.  You take antibiotic medicines.  You have a condition that causes your flow of urine to slow down, such as: ? An enlarged prostate, if you are female. ? Blockage in your urethra (stricture). ? A kidney stone. ? A nerve condition that affects your bladder control (neurogenic bladder). ? Not getting enough to drink, or not urinating often.  You have certain medical conditions, such as: ? Diabetes. ? A weak disease-fighting system (immunesystem). ? Sickle cell disease. ? Gout. ? Spinal cord injury. What are the signs or symptoms? Symptoms of this condition include:  Needing to urinate right away (urgently).  Frequent urination or passing small amounts of urine frequently.  Pain or burning with urination.  Blood in the urine.  Urine that smells bad or unusual.  Trouble urinating.  Cloudy urine.  Vaginal discharge, if you are female.  Pain in the abdomen or the lower back. You may also have:  Vomiting or a decreased appetite.  Confusion.  Irritability or tiredness.  A fever.  Diarrhea. The first symptom in older adults may be confusion. In some cases, they may not have any symptoms until the infection has worsened. How is this diagnosed? This condition is diagnosed based on your medical history and a physical exam. You may also have other tests, including:  Urine tests.  Blood tests.  Tests for sexually transmitted infections (STIs). If you have had more than one UTI, a cystoscopy or imaging studies may be done to  determine the cause of the infections. How is this treated? Treatment for this condition includes:  Antibiotic medicine.  Over-the-counter medicines to treat discomfort.  Drinking enough water to stay hydrated. If you have frequent infections or have other conditions such as a kidney stone, you may need to see a health care provider who specializes in the urinary tract (urologist). In rare  cases, urinary tract infections can cause sepsis. Sepsis is a life-threatening condition that occurs when the body responds to an infection. Sepsis is treated in the hospital with IV antibiotics, fluids, and other medicines. Follow these instructions at home:  Medicines  Take over-the-counter and prescription medicines only as told by your health care provider.  If you were prescribed an antibiotic medicine, take it as told by your health care provider. Do not stop using the antibiotic even if you start to feel better. General instructions  Make sure you: ? Empty your bladder often and completely. Do not hold urine for long periods of time. ? Empty your bladder after sex. ? Wipe from front to back after a bowel movement if you are female. Use each tissue one time when you wipe.  Drink enough fluid to keep your urine pale yellow.  Keep all follow-up visits as told by your health care provider. This is important. Contact a health care provider if:  Your symptoms do not get better after 1-2 days.  Your symptoms go away and then return. Get help right away if you have:  Severe pain in your back or your lower abdomen.  A fever.  Nausea or vomiting. Summary  A urinary tract infection (UTI) is an infection of any part of the urinary tract, which includes the kidneys, ureters, bladder, and urethra.  Most urinary tract infections are caused by bacteria in your genital area, around the entrance to your urinary tract (urethra).  Treatment for this condition often includes antibiotic medicines.  If you were prescribed an antibiotic medicine, take it as told by your health care provider. Do not stop using the antibiotic even if you start to feel better.  Keep all follow-up visits as told by your health care provider. This is important. This information is not intended to replace advice given to you by your health care provider. Make sure you discuss any questions you have with your health  care provider. Document Revised: 06/01/2018 Document Reviewed: 12/22/2017 Elsevier Patient Education  2020 [employer].

## 2020-06-10 NOTE — Progress Notes (Signed)
Patient complains of constant lower abdominal pain. Pain is present 5 out of 7 days. Patient describes pains as cramping with intermittent sharp pains that radiate into her vagina. Patient has taken ibuprofen today- with minimal relief-. Patient complains of no appetite.

## 2020-06-10 NOTE — Progress Notes (Signed)
Established Patient Office Visit  Subjective:  Patient ID: Ann Clark, female    DOB: 19-Dec-1985  Age: 34 y.o. MRN: [id number]  CC:  Chief Complaint  Patient presents with  . Abdominal Pain    HPI Ann Clark reports that she has been having lower abdominal pain for the past week, states that pain has been intermittent, describes it as cramping with intermittent sharp pains that would radiate into her vagina.  Denies vaginal discharge or change in odor, however does do endorse that she had Covid in October 2021 and her sense of smell has not returned to normal.  Denies fever, nausea or vomiting has tried ibuprofen with little relief  Eating once a day - thinks the ibuprofen is upsetting her  stomach.   Reports that she has been having feelings of depression, states that she lost her father a year and a half ago, and her young son has been having a difficult time sleeping out of fears of his family members dying.  Reports that she is only sleeping 5 to 6 hours a night, having difficulty falling asleep and staying asleep.  Reports previous diagnosis of bipolar 1 disorder and PTSD.  Reports that she was previously treated with medication, states that she does not remember the name of the medication but states that when she came off the medication it worsened her mood and had suicidal ideation.  Reports it has been a year and a half since she last had medication to help her with her mental health.  Reports she has been having financial difficulties but does endorse full Medicaid and current employment to be able to afford her medications.    Patient adamantly denies any suicidal or homicidal ideation  Past Medical History:  Diagnosis Date  . Anemia   . Anxiety   . Preterm labor   . Shoulder pain   . Trichomonas infection 08/01/2015    Past Surgical History:  Procedure Laterality Date  . EUSTACHIAN TUBE DILATION    . TONSILLECTOMY      Family History  Problem Relation Age of  Onset  . COPD Mother   . Diabetes Mother   . Hyperlipidemia Mother   . Hypertension Mother   . Anxiety disorder Mother   . Arthritis Mother   . Asthma Mother   . Depression Mother   . Drug abuse Mother   . Vision loss Mother   . Kidney disease Father   . Drug abuse Father   . Heart Problems Father   . ADD / ADHD Sister   . Heart disease Sister   . Hearing loss Son     Social History   Socioeconomic History  . Marital status: Single    Spouse name: Not on file  . Number of children: Not on file  . Years of education: Not on file  . Highest education level: Not on file  Occupational History  . Not on file  Tobacco Use  . Smoking status: Light Tobacco Smoker    Packs/day: 0.25    Types: Cigarettes  . Smokeless tobacco: Never Used  . Tobacco comment: occasional  Vaping Use  . Vaping Use: Never used  Substance and Sexual Activity  . Alcohol use: Yes    Alcohol/week: 2.0 - 3.0 standard drinks    Types: 2 - 3 Shots of liquor per week    Comment: two days weekly  . Drug use: Not Currently    Types: Marijuana    Comment: 4  times monthly  . Sexual activity: Yes  Other Topics Concern  . Not on file  Social History Narrative  . Not on file   Social Determinants of Health   Financial Resource Strain: Not on file  Food Insecurity: Not on file  Transportation Needs: Not on file  Physical Activity: Not on file  Stress: Not on file  Social Connections: Not on file  Intimate Partner Violence: Not on file    Outpatient Medications Prior to Visit  Medication Sig Dispense Refill  . Blood Pressure Monitoring (BLOOD PRESSURE KIT) DEVI 1 kit by Does not apply route once a week. Check BP regularly and record readings into the [employer] App.  Large Cuff. DX O90.0 1 each 0  . diphenhydrAMINE HCl (BENADRYL ALLERGY PO) Take by mouth.    Marland Kitchen ibuprofen (ADVIL) 600 MG tablet Take 1 tablet (600 mg total) by mouth every 8 (eight) hours as needed. 30 tablet 1  . tiZANidine (ZANAFLEX) 4 MG  tablet 1/2 tablet every 8 hours as needed for muscle spasm and may take 1 whole tablet at bedtime if needed 20 tablet 0  . diphenhydramine-acetaminophen (TYLENOL PM) 25-500 MG TABS tablet Take 1-2 tablets by mouth at bedtime as needed (sleep).     . hydrOXYzine (ATARAX/VISTARIL) 10 MG tablet Take 1 tablet (10 mg total) by mouth 3 (three) times daily as needed. 30 tablet 0  . omeprazole (PRILOSEC) 40 MG capsule Take 1 capsule (40 mg total) by mouth daily. 90 capsule 1  . Vitamin D, Ergocalciferol, (DRISDOL) 1.25 MG (50000 UNIT) CAPS capsule 1 pill, once per week 12 capsule 1   No facility-administered medications prior to visit.    Allergies  Allergen Reactions  . No Known Allergies     ROS Review of Systems  Constitutional: Negative for chills and fever.  HENT: Negative.   Eyes: Negative.   Respiratory: Negative.   Cardiovascular: Negative.   Gastrointestinal: Positive for abdominal pain. Negative for diarrhea, nausea and vomiting.  Endocrine: Negative.   Genitourinary: Negative for dysuria, hematuria and vaginal discharge.  Musculoskeletal: Negative.   Skin: Negative.   Allergic/Immunologic: Negative.   Neurological: Negative.   Hematological: Negative.   Psychiatric/Behavioral: Positive for dysphoric mood and sleep disturbance. Negative for self-injury and suicidal ideas. The patient is nervous/anxious.       Objective:    Physical Exam Vitals and nursing note reviewed.  Constitutional:      Appearance: She is well-developed.  HENT:     Head: Normocephalic and atraumatic.     Mouth/Throat:     Mouth: Mucous membranes are moist.     Pharynx: Oropharynx is clear.  Eyes:     Extraocular Movements: Extraocular movements intact.     Pupils: Pupils are equal, round, and reactive to light.  Cardiovascular:     Rate and Rhythm: Normal rate and regular rhythm.     Heart sounds: Normal heart sounds.  Pulmonary:     Effort: Pulmonary effort is normal.     Breath sounds:  Normal breath sounds.  Abdominal:     General: Abdomen is flat. Bowel sounds are normal.     Palpations: Abdomen is soft.     Tenderness: There is generalized abdominal tenderness. There is no right CVA tenderness or left CVA tenderness.  Skin:    General: Skin is warm and dry.  Neurological:     General: No focal deficit present.     Mental Status: She is alert.     BP 119/83 (BP  Location: Right Arm, Patient Position: Sitting, Cuff Size: Normal)   Pulse 86   Temp 98.7 F (37.1 C) (Oral)   Resp 18   Ht _0  (1.549 m)   Wt 154 lb (69.9 kg)   SpO2 98%   BMI 29.10 kg/m  Wt Readings from Last 3 Encounters:  06/10/20 154 lb (69.9 kg)  01/07/20 169 lb 6.4 oz (76.8 kg)  11/07/19 171 lb (77.6 kg)     There are no preventive care reminders to display for this patient.  There are no preventive care reminders to display for this patient.  Lab Results  Component Value Date   TSH 0.647 11/07/2019   Lab Results  Component Value Date   WBC 5.2 02/20/2020   HGB 11.7 02/20/2020   HCT 36.5 02/20/2020   MCV 76 (L) 02/20/2020   PLT 215 02/20/2020   Lab Results  Component Value Date   NA 138 02/15/2020   K 3.5 02/15/2020   CO2 21 (L) 02/15/2020   GLUCOSE 81 02/15/2020   BUN 9 02/15/2020   CREATININE 0.76 02/15/2020   BILITOT 0.7 02/15/2020   ALKPHOS 43 02/15/2020   AST 15 02/15/2020   ALT 10 02/15/2020   PROT 7.1 02/15/2020   ALBUMIN 4.1 02/15/2020   CALCIUM 9.0 02/15/2020   ANIONGAP 10 02/15/2020   Lab Results  Component Value Date   CHOL 158 01/10/2017   Lab Results  Component Value Date   HDL 54 01/10/2017   Lab Results  Component Value Date   LDLCALC 92 01/10/2017   Lab Results  Component Value Date   TRIG 62 01/10/2017   Lab Results  Component Value Date   CHOLHDL 2.9 01/10/2017   Lab Results  Component Value Date   HGBA1C 5.2 01/07/2020      Assessment & Plan:   Problem List Items Addressed This Visit      Digestive   GERD  (gastroesophageal reflux disease)   Relevant Medications   omeprazole (PRILOSEC) 40 MG capsule     Other   Bipolar I disorder (HCC)   Relevant Medications   ARIPiprazole (ABILIFY) 5 MG tablet   Other Relevant Orders   Ambulatory referral to Psychiatry   Vitamin D deficiency   Relevant Medications   Vitamin D, Ergocalciferol, (DRISDOL) 1.25 MG (50000 UNIT) CAPS capsule    Other Visit Diagnoses    Acute cystitis without hematuria    -  Primary   Relevant Medications   nitrofurantoin, macrocrystal-monohydrate, (MACROBID) 100 MG capsule   Other Relevant Orders   POCT URINALYSIS DIP (CLINITEK) (Completed)   Urine Culture   Bacterial vaginitis       Relevant Medications   nitrofurantoin, macrocrystal-monohydrate, (MACROBID) 100 MG capsule   Other Relevant Orders   Cervicovaginal ancillary only   Anxiety state       Relevant Medications   hydrOXYzine (ATARAX/VISTARIL) 10 MG tablet   Other Relevant Orders   Ambulatory referral to Psychiatry    1. Acute cystitis without hematuria UA positive for leukocytes, trial Macrobid, increase hydration.  Red flags given for prompt reevaluation - POCT URINALYSIS DIP (CLINITEK) - nitrofurantoin, macrocrystal-monohydrate, (MACROBID) 100 MG capsule; Take 1 capsule (100 mg total) by mouth 2 (two) times daily.  Dispense: 10 capsule; Refill: 0  2. Bacterial vaginitis History of bacterial vaginitis in August 2021, will screen - Cervicovaginal ancillary only  3. Gastroesophageal reflux disease without esophagitis Resume Prilosec on a daily basis - omeprazole (PRILOSEC) 40 MG capsule; Take 1 capsule (40 mg  total) by mouth daily.  Dispense: 90 capsule; Refill: 1  4. Anxiety state Trial hydroxyzine 10 mg 3 times a day as needed, can use 10 to 20 mg at bedtime for insomnia patient agreeable for psych referral for medication management, CBT - Ambulatory referral to Psychiatry - hydrOXYzine (ATARAX/VISTARIL) 10 MG tablet; Take 1 tablet (10 mg total) by  mouth 3 (three) times daily as needed.  Dispense: 30 tablet; Refill: 0  5. Bipolar I disorder (HCC) Trial Abilify 5 mg at bedtime - Ambulatory referral to Psychiatry - ARIPiprazole (ABILIFY) 5 MG tablet; Take 1 tablet (5 mg total) by mouth daily.  Dispense: 30 tablet; Refill: 1  6. Vitamin D deficiency Previous diagnosis of vitamin D deficiency, patient was unable to complete course due to financial constraints, resume - Vitamin D, Ergocalciferol, (DRISDOL) 1.25 MG (50000 UNIT) CAPS capsule; Take 1 capsule (50,000 Units total) by mouth every 7 (seven) days.  Dispense: 4 capsule; Refill: 2   Patient given appointment to see primary care provider in 2 to 3 months.  Patient to follow-up in mobile medicine unit in 2 weeks   I have reviewed the patient's medical history (PMH, PSH, Social History, Family History, Medications, and allergies) , and have been updated if relevant. I spent 30 minutes reviewing chart and  face to face time with patient.    Meds ordered this encounter  Medications  . ARIPiprazole (ABILIFY) 5 MG tablet    Sig: Take 1 tablet (5 mg total) by mouth daily.    Dispense:  30 tablet    Refill:  1    Order Specific Question:   Supervising Provider    Answer:   Joya Gaskins, PATRICK E [[id number]]  . omeprazole (PRILOSEC) 40 MG capsule    Sig: Take 1 capsule (40 mg total) by mouth daily.    Dispense:  90 capsule    Refill:  1    Order Specific Question:   Supervising Provider    Answer:   WRIGHT, PATRICK E [[id number]]  . hydrOXYzine (ATARAX/VISTARIL) 10 MG tablet    Sig: Take 1 tablet (10 mg total) by mouth 3 (three) times daily as needed.    Dispense:  30 tablet    Refill:  0    Order Specific Question:   Supervising Provider    Answer:   Joya Gaskins, PATRICK E [[id number]]  . Vitamin D, Ergocalciferol, (DRISDOL) 1.25 MG (50000 UNIT) CAPS capsule    Sig: Take 1 capsule (50,000 Units total) by mouth every 7 (seven) days.    Dispense:  4 capsule    Refill:  2    Order Specific Question:    Supervising Provider    Answer:   Asencion Noble E [[id number]]  . nitrofurantoin, macrocrystal-monohydrate, (MACROBID) 100 MG capsule    Sig: Take 1 capsule (100 mg total) by mouth 2 (two) times daily.    Dispense:  10 capsule    Refill:  0    Order Specific Question:   Supervising Provider    Answer:   Elsie Stain [[id number]]    Follow-up: Return in about 2 weeks (around 06/24/2020).    Loraine Grip Mayers, PA-C

## 2020-06-11 ENCOUNTER — Other Ambulatory Visit: Payer: Self-pay | Admitting: Physician Assistant

## 2020-06-11 ENCOUNTER — Encounter: Payer: Self-pay | Admitting: Physician Assistant

## 2020-06-11 ENCOUNTER — Telehealth: Payer: Self-pay

## 2020-06-11 DIAGNOSIS — F411 Generalized anxiety disorder: Secondary | ICD-10-CM | POA: Insufficient documentation

## 2020-06-11 DIAGNOSIS — N3000 Acute cystitis without hematuria: Secondary | ICD-10-CM | POA: Insufficient documentation

## 2020-06-11 DIAGNOSIS — N760 Acute vaginitis: Secondary | ICD-10-CM | POA: Insufficient documentation

## 2020-06-11 LAB — CERVICOVAGINAL ANCILLARY ONLY
Bacterial Vaginitis (gardnerella): POSITIVE — AB
Candida Glabrata: NEGATIVE
Candida Vaginitis: NEGATIVE
Chlamydia: NEGATIVE
Comment: NEGATIVE
Comment: NEGATIVE
Comment: NEGATIVE
Comment: NEGATIVE
Comment: NEGATIVE
Comment: NORMAL
Neisseria Gonorrhea: NEGATIVE
Trichomonas: NEGATIVE

## 2020-06-11 MED ORDER — METRONIDAZOLE 500 MG PO TABS: 500.0000 mg | ORAL_TABLET | Freq: Two times a day (BID) | ORAL | 0 refills | Status: DC

## 2020-06-11 MED FILL — metroNIDAZOLE 500 MG TABS: 500 | 7 days supply | Qty: 14 | Fill #0

## 2020-06-11 NOTE — Addendum Note (Signed)
Addended by: Roney Jaffe on: 06/11/2020 01:41 PM   Modules accepted: Orders

## 2020-06-11 NOTE — Telephone Encounter (Signed)
Spoke with patient over the phone to confirm date and time for follow up visit with [facility].

## 2020-06-12 ENCOUNTER — Telehealth: Payer: Self-pay | Admitting: *Deleted

## 2020-06-12 NOTE — Telephone Encounter (Signed)
-----   Message from Roney Jaffe, [place] sent at 06/11/2020  1:41 PM EST ----- Please call patient and let her know that her vaginal swab was positive for bacterial vaginitis.  In addition to the medication she is being treated for a UTI with, she will need to take metronidazole, prescription sent to her pharmacy

## 2020-06-12 NOTE — Telephone Encounter (Signed)
Patient verified DOB Patient is aware of needing treatment BV and finish treatment for UTI. Patient was advised to keep 12/28 for FU regarding abdominal pain and recurrent BV.

## 2020-06-18 LAB — URINE CULTURE

## 2020-06-24 ENCOUNTER — Ambulatory Visit: Payer: Medicaid Other

## 2020-06-25 ENCOUNTER — Other Ambulatory Visit: Payer: Self-pay

## 2020-06-25 ENCOUNTER — Ambulatory Visit: Payer: Medicaid Other | Admitting: Physician Assistant

## 2020-06-25 VITALS — BP 121/75 | HR 66 | Temp 98.7°F | Resp 18 | Ht 62.0 in

## 2020-06-25 DIAGNOSIS — G4700 Insomnia, unspecified: Secondary | ICD-10-CM | POA: Insufficient documentation

## 2020-06-25 DIAGNOSIS — F319 Bipolar disorder, unspecified: Secondary | ICD-10-CM | POA: Diagnosis not present

## 2020-06-25 DIAGNOSIS — B9689 Other specified bacterial agents as the cause of diseases classified elsewhere: Secondary | ICD-10-CM

## 2020-06-25 DIAGNOSIS — F411 Generalized anxiety disorder: Secondary | ICD-10-CM | POA: Diagnosis not present

## 2020-06-25 DIAGNOSIS — N760 Acute vaginitis: Secondary | ICD-10-CM | POA: Diagnosis not present

## 2020-06-25 MED ORDER — HYDROXYZINE HCL 50 MG PO TABS: ORAL_TABLET | ORAL | 0 refills | Status: DC

## 2020-06-25 MED ORDER — METRONIDAZOLE 0.75 % VA GEL: 1.0000 | Freq: Two times a day (BID) | VAGINAL | 0 refills | Status: DC

## 2020-06-25 NOTE — Patient Instructions (Signed)
You will use the MetroGel twice daily for 7 days.  Continue the Abilify 5 mg at bedtime, you can take 25- 50 mg of hydroxyzine at bedtime to help with sleep.  I encourage you to take 10 mg of hydroxyzine in the morning to help with anxiety.  Please let us know if there is anything else we can do for you.  Roney Jaffe, PA-C Physician Assistant [facility] [email or web address]   Metronidazole vaginal gel What is this medicine? METRONIDAZOLE (me troe NI da zole) VAGINAL GEL is an antiinfective. It is used to treat bacterial vaginitis. This medicine may be used for other purposes; ask your health care provider or pharmacist if you have questions. COMMON BRAND NAME(S): MetroGel, MetroGel Vaginal, MetroGel-Vaginal, NUVESSA, Vandazole What should I tell my health care provider before I take this medicine? They need to know if you have any of these conditions:  Cockayne syndrome  history of blood diseases, like sickle cell disease or leukemia  history of yeast infection  if you often drink alcohol  liver disease  an unusual or allergic reaction to metronidazole, nitroimidazoles, parabens, or other medicines, foods, dyes, or preservatives  pregnant or trying to get pregnant  breast-feeding How should I use this medicine? This medicine is only for use in the vagina. Do not take by mouth or apply to other areas of the body. Follow the directions on the prescription label. Wash hands before and after use. Screw the applicator to the tube and squeeze the tube gently to fill the applicator. Lie on your back, part and bend your knees. Insert the applicator tip high in the vagina and push the plunger to release the gel into the vagina. Gently remove the applicator. Wash the applicator well with warm water and soap. Use at regular intervals. Finish the full course prescribed by your doctor or health care professional even if you think your  condition is better. Do not stop using except on the advice of your doctor or health care professional. Talk to your pediatrician regarding the use of this medicine in children. While this drug maybe prescribed for children as young as 12 years for selected conditions, precautions do apply. Overdosage: If you think you have taken too much of this medicine contact a poison control center or emergency room at once. NOTE: This medicine is only for you. Do not share this medicine with others. What if I miss a dose? If you miss a dose, use it as soon as you can. If it is almost time for your next dose, use only that dose. Do not use double or extra doses. What may interact with this medicine? Do not take this medicine with any of the following medications:  alcohol or any product that contains alcohol  cisapride  disulfiram  dronedarone  pimozide  thioridazine This medicine may also interact with the following medications:  amiodarone  birth control pills  busulfan  carbamazepine  cimetidine  cyclosporine  fluorouracil  lithium  other medicines that prolong the QT interval (cause an abnormal heart rhythm) like dofetilide, ziprasidone  phenobarbital  phenytoin  quinidine  tacrolimus  vecuronium  warfarin This list may not describe all possible interactions. Give your health care provider a list of all the medicines, herbs, non-prescription drugs, or dietary supplements you use. Also tell them if you smoke, drink alcohol, or use illegal drugs. Some items may interact with your medicine. What should I watch for while using this medicine? Tell your doctor or health  care professional if your symptoms do not improve or if they get worse. You may get drowsy or dizzy. Do not drive, use machinery, or do anything that needs mental alertness until you know how this medicine affects you. Do not stand or sit up quickly, especially if you are an older patient. This reduces the risk  of dizzy or fainting spells. Ask your doctor or health care professional if you should avoid alcohol. Many nonprescription cough and cold products contain alcohol. Metronidazole can cause an unpleasant reaction when taken with alcohol. The reaction includes flushing, headache, nausea, vomiting, sweating, and increased thirst. The reaction can last from 30 minutes to several hours. If you are being treated for a sexually transmitted disease, avoid sexual contact until you have finished your treatment. Your sexual partner may also need treatment. What side effects may I notice from receiving this medicine? Side effects that you should report to your doctor or health care professional as soon as possible:  allergic reactions like skin rash, itching or hives, swelling of the face, lips, or tongue  confusion  fast, irregular heartbeat  fever, chills, sore throat  fever with rash, swollen lymph nodes, or swelling of the face  pain, tingling, numbness in the hands or feet  redness, blistering, peeling or loosening of the skin, including inside the mouth  seizures  sign and symptoms of liver injury like dark yellow or brown urine; general ill feeling or flu-like symptoms; light colored stools; loss of appetite; nausea; right upper belly pain; unusually weak or tired; yellowing of the eyes or skin  vaginal discharge, itching, or odor in women Side effects that usually do not require medical attention (report to your doctor or health care professional if they continue or are bothersome):  changes in taste  diarrhea  headache  nausea, vomiting  stomach pain This list may not describe all possible side effects. Call your doctor for medical advice about side effects. You may report side effects to FDA at 1-800-FDA-1088. Where should I keep my medicine? Keep out of the reach of children. Store at room temperature between 15 and 30 degrees C (59 and 86 degrees F). Do not freeze. Throw away  any unused medicine after the expiration date. NOTE: This sheet is a summary. It may not cover all possible information. If you have questions about this medicine, talk to your doctor, pharmacist, or health care provider.  2020 Elsevier/Gold Standard (2018-06-06 06:53:27)

## 2020-06-25 NOTE — Progress Notes (Signed)
Established Patient Office Visit  Subjective:  Patient ID: Ann Clark, female    DOB: [birth year]  Age: 34 y.o. MRN: [id number]  CC: No chief complaint on file.   HPI Ann Clark reports that she did finish the trial of Macrobid, states that she was unable to finish the metronidazole, states that she has tried taking it every other day, but states that it causes her to have acid reflux.  Does endorse that she continues to have some suprapubic discomfort.  Denies vaginal discharge.  Reports that she has been taking the Abilify 5 mg along with hydroxyzine 10 mg at bedtime.  Reports that she feels more motivated, more stable moods reports the first week she slept really well, states that she feels the sleepy effect of the medication is wearing off and is not sleeping as well.  Reports that she has not been taking the hydroxyzine during the day.  Does endorse that she continues to have anxiety and chest discomfort during the day, but does report it is improving.  States that she made an appointment to restart talk therapy, states that she was confused and thought the psychiatry appointment was for talk therapy and not medication management.  No new concerns at this time  Past Medical History:  Diagnosis Date  . Anemia   . Anxiety   . Preterm labor   . Shoulder pain   . Trichomonas infection 08/01/2015    Past Surgical History:  Procedure Laterality Date  . EUSTACHIAN TUBE DILATION    . TONSILLECTOMY      Family History  Problem Relation Age of Onset  . COPD Mother   . Diabetes Mother   . Hyperlipidemia Mother   . Hypertension Mother   . Anxiety disorder Mother   . Arthritis Mother   . Asthma Mother   . Depression Mother   . Drug abuse Mother   . Vision loss Mother   . Kidney disease Father   . Drug abuse Father   . Heart Problems Father   . ADD / ADHD Sister   . Heart disease Sister   . Hearing loss Son     Social History   Socioeconomic History  . Marital  status: Single    Spouse name: Not on file  . Number of children: Not on file  . Years of education: Not on file  . Highest education level: Not on file  Occupational History  . Not on file  Tobacco Use  . Smoking status: Light Tobacco Smoker    Packs/day: 0.25    Types: Cigarettes  . Smokeless tobacco: Never Used  . Tobacco comment: occasional  Vaping Use  . Vaping Use: Never used  Substance and Sexual Activity  . Alcohol use: Yes    Alcohol/week: 2.0 - 3.0 standard drinks    Types: 2 - 3 Shots of liquor per week    Comment: two days weekly  . Drug use: Not Currently    Types: Marijuana    Comment: 4 times monthly  . Sexual activity: Yes  Other Topics Concern  . Not on file  Social History Narrative  . Not on file   Social Determinants of Health   Financial Resource Strain: Not on file  Food Insecurity: Not on file  Transportation Needs: Not on file  Physical Activity: Not on file  Stress: Not on file  Social Connections: Not on file  Intimate Partner Violence: Not on file    Outpatient Medications Prior to  Visit  Medication Sig Dispense Refill  . ARIPiprazole (ABILIFY) 5 MG tablet Take 1 tablet (5 mg total) by mouth daily. 30 tablet 1  . Blood Pressure Monitoring (BLOOD PRESSURE KIT) DEVI 1 kit by Does not apply route once a week. Check BP regularly and record readings into the [employer] App.  Large Cuff. DX O90.0 1 each 0  . diphenhydrAMINE HCl (BENADRYL ALLERGY PO) Take by mouth.    . hydrOXYzine (ATARAX/VISTARIL) 10 MG tablet Take 1 tablet (10 mg total) by mouth 3 (three) times daily as needed. 30 tablet 0  . ibuprofen (ADVIL) 600 MG tablet Take 1 tablet (600 mg total) by mouth every 8 (eight) hours as needed. 30 tablet 1  . nitrofurantoin, macrocrystal-monohydrate, (MACROBID) 100 MG capsule Take 1 capsule (100 mg total) by mouth 2 (two) times daily. 10 capsule 0  . omeprazole (PRILOSEC) 40 MG capsule Take 1 capsule (40 mg total) by mouth daily. 90 capsule 1  .  tiZANidine (ZANAFLEX) 4 MG tablet 1/2 tablet every 8 hours as needed for muscle spasm and may take 1 whole tablet at bedtime if needed 20 tablet 0  . Vitamin D, Ergocalciferol, (DRISDOL) 1.25 MG (50000 UNIT) CAPS capsule Take 1 capsule (50,000 Units total) by mouth every 7 (seven) days. 4 capsule 2   No facility-administered medications prior to visit.    Allergies  Allergen Reactions  . No Known Allergies     ROS Review of Systems  Constitutional: Negative.   HENT: Negative.   Eyes: Negative.   Respiratory: Positive for chest tightness.   Cardiovascular: Negative.   Gastrointestinal: Positive for abdominal pain. Negative for nausea and vomiting.  Endocrine: Negative.   Genitourinary: Negative for dysuria, genital sores, hematuria, pelvic pain and vaginal discharge.  Musculoskeletal: Negative.   Skin: Negative.   Allergic/Immunologic: Negative.   Neurological: Negative.   Hematological: Negative.   Psychiatric/Behavioral: Positive for sleep disturbance. Negative for dysphoric mood, self-injury and suicidal ideas. The patient is nervous/anxious.       Objective:    Physical Exam Vitals and nursing note reviewed.  Constitutional:      Appearance: Normal appearance.  HENT:     Head: Normocephalic and atraumatic.     Right Ear: External ear normal.     Left Ear: External ear normal.     Nose: Nose normal.     Mouth/Throat:     Mouth: Mucous membranes are dry.     Pharynx: Oropharynx is clear.  Eyes:     Extraocular Movements: Extraocular movements intact.     Conjunctiva/sclera: Conjunctivae normal.     Pupils: Pupils are equal, round, and reactive to light.  Cardiovascular:     Rate and Rhythm: Regular rhythm.     Pulses: Normal pulses.     Heart sounds: Normal heart sounds.  Pulmonary:     Effort: Pulmonary effort is normal.     Breath sounds: Normal breath sounds.  Abdominal:     Tenderness: There is abdominal tenderness in the suprapubic area. There is no right  CVA tenderness or left CVA tenderness.  Musculoskeletal:        General: Normal range of motion.     Cervical back: Normal range of motion and neck supple.  Skin:    General: Skin is warm.  Neurological:     General: No focal deficit present.     Mental Status: She is alert and oriented to person, place, and time.  Psychiatric:        Mood  and Affect: Mood normal.        Behavior: Behavior normal.        Thought Content: Thought content normal.        Judgment: Judgment normal.     BP 121/75 (BP Location: Left Arm, Patient Position: Sitting, Cuff Size: Normal)   Pulse 66   Temp 98.7 F (37.1 C) (Oral)   Resp 18   Ht 5' 2"  (1.575 m)   SpO2 99%   BMI 28.17 kg/m  Wt Readings from Last 3 Encounters:  06/10/20 154 lb (69.9 kg)  01/07/20 169 lb 6.4 oz (76.8 kg)  11/07/19 171 lb (77.6 kg)     There are no preventive care reminders to display for this patient.  There are no preventive care reminders to display for this patient.  Lab Results  Component Value Date   TSH 0.647 11/07/2019   Lab Results  Component Value Date   WBC 5.2 02/20/2020   HGB 11.7 02/20/2020   HCT 36.5 02/20/2020   MCV 76 (L) 02/20/2020   PLT 215 02/20/2020   Lab Results  Component Value Date   NA 138 02/15/2020   K 3.5 02/15/2020   CO2 21 (L) 02/15/2020   GLUCOSE 81 02/15/2020   BUN 9 02/15/2020   CREATININE 0.76 02/15/2020   BILITOT 0.7 02/15/2020   ALKPHOS 43 02/15/2020   AST 15 02/15/2020   ALT 10 02/15/2020   PROT 7.1 02/15/2020   ALBUMIN 4.1 02/15/2020   CALCIUM 9.0 02/15/2020   ANIONGAP 10 02/15/2020   Lab Results  Component Value Date   CHOL 158 01/10/2017   Lab Results  Component Value Date   HDL 54 01/10/2017   Lab Results  Component Value Date   LDLCALC 92 01/10/2017   Lab Results  Component Value Date   TRIG 62 01/10/2017   Lab Results  Component Value Date   CHOLHDL 2.9 01/10/2017   Lab Results  Component Value Date   HGBA1C 5.2 01/07/2020       Assessment & Plan:   Problem List Items Addressed This Visit      Genitourinary   Bacterial vaginitis - Primary   Relevant Medications   metroNIDAZOLE (METROGEL VAGINAL) 0.75 % vaginal gel     Other   Bipolar I disorder (HCC)   Anxiety state   Relevant Medications   hydrOXYzine (ATARAX/VISTARIL) 50 MG tablet    Other Visit Diagnoses    Insomnia, unspecified type       Relevant Medications   hydrOXYzine (ATARAX/VISTARIL) 50 MG tablet    1. Bacterial vaginitis Trial MetroGel, continue increased hydration. - metroNIDAZOLE (METROGEL VAGINAL) 0.75 % vaginal gel; Place 1 Applicatorful vaginally 2 (two) times daily.  Dispense: 70 g; Refill: 0  2. Bipolar I disorder (HCC) Continue Abilify 5 mg once daily.  No refill needed today.  Patient will follow up with psychiatry referral for medication management, continue with CBT appointment  3. Anxiety state Encouraged trial hydroxyzine 10 mg in the morning, afternoon as needed.  No refill needed today  4. Insomnia, unspecified type Trial hydroxyzine 25 to 50 mg at bedtime - hydrOXYzine (ATARAX/VISTARIL) 50 MG tablet; Take 1/2 - 1 full tablets QHS prn for insomnia  Dispense: 30 tablet; Refill: 0  Patient given appointment to establish care at [facility].   I have reviewed the patient's medical history (PMH, PSH, Social History, Family History, Medications, and allergies) , and have been updated if relevant. I spent 30 minutes reviewing chart and  face to face time with patient.     Meds ordered this encounter  Medications  . metroNIDAZOLE (METROGEL VAGINAL) 0.75 % vaginal gel    Sig: Place 1 Applicatorful vaginally 2 (two) times daily.    Dispense:  70 g    Refill:  0    Order Specific Question:   Supervising Provider    Answer:   Asencion Noble E [[id number]]  . hydrOXYzine (ATARAX/VISTARIL) 50 MG tablet    Sig: Take 1/2 - 1 full tablets QHS prn for insomnia    Dispense:  30 tablet    Refill:  0    Order Specific  Question:   Supervising Provider    Answer:   Elsie Stain [[id number]]    Follow-up: Return in about 26 days (around 07/21/2020) for To establish PCP at [facility] .    Loraine Grip Mayers, PA-C

## 2020-06-25 NOTE — Progress Notes (Signed)
Patient complains of intermittent chest pain while out in public. Patient complains of abdominal cramping still present at a 5. Patient completed antibiotics but has been taking them every other day due to intolerance of medication and causing nausea and no appetite.

## 2020-07-15 MED FILL — VIT D2 1.25 MG (50,000 UNIT: 1.25 MG | 28 days supply | Qty: 4 | Fill #1

## 2020-07-15 MED FILL — OMEPRAZOLE DR 40 MG CAPSULE: 40 | 30 days supply | Qty: 30 | Fill #1

## 2020-07-15 MED FILL — ARIPiprazole 5 MG TABS: 5 | 30 days supply | Qty: 30 | Fill #1

## 2020-07-20 NOTE — Progress Notes (Signed)
Patient did not show for appointment.   

## 2020-07-21 ENCOUNTER — Encounter: Payer: Medicaid Other | Admitting: Family

## 2020-07-21 DIAGNOSIS — Z7689 Persons encountering health services in other specified circumstances: Secondary | ICD-10-CM

## 2020-07-21 NOTE — Progress Notes (Signed)
Subjective:    Ann Clark - 35 y.o. female MRN [id number]  Date of birth: October 16, 1985  HPI  Ann Clark is to establish care. Patient has a PMH significant for gastroesophageal reflux disease, myalgia and myositis, acute cystitis without hematuria, bacterial vaginitis, arthralgia, bipolar I disorder,  H. Pylori infection, vitamin D deficiency, anxiety state, and insomnia.  Current issues and/or concerns:  1. BACTERIAL VAGINITIS FOLLOW-UP: 06/25/2020: Visit with physician assistant Cari Mayers. Patient seen for bacterial vaginitis. Prescribed Metronidazole gel.   07/22/2020: Today reports frequent BV infections recently and would like to be retested.  Duration: months Discharge description: clear  Pruritus: no Dysuria: no Malodorous: no Urinary frequency: no Fevers: no Abdominal pain: no  Recent antibiotic use: yes  2. BIPOLAR FOLLOW-UP: 06/25/2020: Visit with physician assistant Cari Mayers.  Continued on Abilify. Follow-up with Psychiatry, continue with CBT appointment.  07/22/2020: Reports moods are stable while taking medication. Denies thoughts of self-harm, suicidal ideations, and homicidal ideations. Requesting refills for Abilify. Has not seen Psychiatry as of yet.   3. ANXIETY/INSOMNIA FOLLOW-UP: 06/25/2020: Visit with physician assistant Cari Mayers. Continued on Hydroxyzine.  07/22/2020: Reports anxiety primarily related to PTSD from childhood trauma and relationship with a recent partner. Requesting refill of Hydroxyzine.   ROS per HPI    Health Maintenance:  Health Maintenance Due  Topic Date Due  . COVID-19 Vaccine (1) Never done    Past Medical History: Patient Active Problem List   Diagnosis Date Noted  . Insomnia 06/25/2020  . Acute cystitis without hematuria 06/11/2020  . Bacterial vaginitis 06/11/2020  . Anxiety state 06/11/2020  . H. pylori infection 02/27/2020  . Vitamin D deficiency 02/27/2020  . Myalgia and myositis 07/30/2015  .  GERD (gastroesophageal reflux disease) 07/30/2015  . Arthralgia 07/16/2015  . Bipolar I disorder (HCC) 07/16/2015   Social History   reports that she has been smoking cigarettes. She has been smoking about 0.25 packs per day. She has never used smokeless tobacco. She reports current alcohol use of about 2.0 - 3.0 standard drinks of alcohol per week. She reports current drug use. Drug: Marijuana.   Family History  family history includes ADD / ADHD in her sister; Anxiety disorder in her mother; Arthritis in her mother; Asthma in her mother; COPD in her mother; Depression in her mother; Diabetes in her mother; Drug abuse in her father and mother; Hearing loss in her son; Heart Problems in her father; Heart disease in her sister; Hyperlipidemia in her mother; Hypertension in her mother; Kidney disease in her father; Vision loss in her mother.   Medications: reviewed and updated   Objective:   Physical Exam BP 121/83 (BP Location: Left Arm, Patient Position: Sitting)   Pulse 68   Ht 5' 1.06" (1.551 m)   Wt 162 lb 12.8 oz (73.8 kg)   SpO2 98%   BMI 30.70 kg/m  Physical Exam HENT:     Head: Normocephalic.  Eyes:     Extraocular Movements: Extraocular movements intact.     Pupils: Pupils are equal, round, and reactive to light.  Cardiovascular:     Rate and Rhythm: Normal rate and regular rhythm.     Pulses: Normal pulses.     Heart sounds: Normal heart sounds.  Pulmonary:     Effort: Pulmonary effort is normal.     Breath sounds: Normal breath sounds.  Musculoskeletal:     Cervical back: Normal range of motion and neck supple.  Neurological:     General: No  focal deficit present.     Mental Status: She is alert and oriented to person, place, and time.  Psychiatric:        Mood and Affect: Mood normal.        Behavior: Behavior normal.     Results for orders placed or performed in visit on 07/22/20  POCT URINALYSIS DIP (CLINITEK)  Result Value Ref Range   Color, UA yellow  yellow   Clarity, UA cloudy (A) clear   Glucose, UA negative negative mg/dL   Bilirubin, UA negative negative   Ketones, POC UA negative negative mg/dL   Spec Grav, UA 9.622 2.979 - 1.025   Blood, UA trace-intact (A) negative   pH, UA 7.0 5.0 - 8.0   POC PROTEIN,UA negative negative, trace   Urobilinogen, UA 0.2 0.2 or 1.0 E.U./dL   Nitrite, UA Negative Negative   Leukocytes, UA Negative Negative    Assessment & Plan:  1. Encounter to establish care: - Patient presents today to establish care.  - Return in 4 to 6 weeks or sooner if needed for annual physical examination, labs, and health maintenance. Arrive fasting meaning having had no food and/or nothing to drink for at least 8 hours prior to appointment.  2. Vaginal discharge: - Patient reports recurrent bacterial vaginitis infections and would like to be retested today. - Cervicovaginal ancillary to screen for chlamydia, gonorrhea, trichomonas, yeast, and bacterial vaginitis. Will call with results.  - Cervicovaginal ancillary only  3. Pain passing urine: - Urinalysis today negative for urinary tract infection.  - POCT URINALYSIS DIP (CLINITEK)  4. Bipolar I disorder (HCC): - Stable.  - Denies thoughts of self-harm, suicidal ideations, and homicidal ideations.  - Continue Aripiprazole as prescribed. Counseled patient this is a courtesy refill and that she will need to follow-up with Psychiatry. Patient agreeable. - Referral to Psychiatry for further evaluation and management. - Ambulatory referral to Psychiatry - ARIPiprazole (ABILIFY) 5 MG tablet; Take 1 tablet (5 mg total) by mouth daily.  Dispense: 30 tablet; Refill: 0  5. Anxiety state: - Stable.  - Denies thoughts of self-harm, suicidal ideations, and homicidal ideations.  - Patient reports anxiety primarily related to PTSD from childhood trauma and relationship with a recent partner. - Continue Hydroxyzine as prescribed. - Referral to Psychiatry for further  evaluation and management.  - hydrOXYzine (ATARAX/VISTARIL) 50 MG tablet; Take 1/2 - 1 full tablets QHS prn for insomnia  Dispense: 30 tablet; Refill: 0 - Ambulatory referral to Psychiatry  6. Insomnia, unspecified type:  -Continue Hydroxyzine as prescribed.  - Follow-up with primary provider as needed. - hydrOXYzine (ATARAX/VISTARIL) 50 MG tablet; Take 1/2 - 1 full tablets QHS prn for insomnia  Dispense: 30 tablet; Refill: 0  7. Constipation, unspecified constipation type: - Polyethylene glycol powder as prescribed for constipation. - Follow-up with primary provider as needed. - polyethylene glycol powder (GLYCOLAX/MIRALAX) 17 GM/SCOOP powder; Take 17 g by mouth daily.  Dispense: 3350 g; Refill: 0  Drink plenty of fluid, preferably water, throughout the day  Eat foods high in fiber such as fruits, vegetables, and grains  Exercise, such as walking, is a good way to keep your bowels regular  Drink warm fluids, especially warm prune juice, or decaf coffee  Eat a 1/2 cup of real oatmeal (not instant), 1/2 cup applesauce, and 1/2-1 cup warm prune juice every day  If you still are having problems with constipation, you may take Miralax once daily as needed to help keep your bowels regular.  Patient was given clear instructions to go to Emergency Department or return to medical center if symptoms don't improve, worsen, or new problems develop.The patient verbalized understanding.   Ricky Stabs, NP 07/22/2020, 4:02 PM Primary Care at [facility]

## 2020-07-22 ENCOUNTER — Other Ambulatory Visit (HOSPITAL_COMMUNITY)
Admission: RE | Admit: 2020-07-22 | Discharge: 2020-07-22 | Disposition: A | Discharge status: Home / Self Care | Payer: Medicaid Other | Source: Ambulatory Visit | Attending: Family | Admitting: Family

## 2020-07-22 ENCOUNTER — Encounter: Payer: Self-pay | Admitting: Family

## 2020-07-22 ENCOUNTER — Ambulatory Visit (INDEPENDENT_AMBULATORY_CARE_PROVIDER_SITE_OTHER): Payer: Medicaid Other | Admitting: Family

## 2020-07-22 ENCOUNTER — Other Ambulatory Visit: Payer: Self-pay

## 2020-07-22 VITALS — BP 121/83 | HR 68 | Ht 61.06 in | Wt 162.8 lb

## 2020-07-22 DIAGNOSIS — Z7689 Persons encountering health services in other specified circumstances: Secondary | ICD-10-CM

## 2020-07-22 DIAGNOSIS — F319 Bipolar disorder, unspecified: Secondary | ICD-10-CM | POA: Diagnosis not present

## 2020-07-22 DIAGNOSIS — N898 Other specified noninflammatory disorders of vagina: Secondary | ICD-10-CM | POA: Diagnosis not present

## 2020-07-22 DIAGNOSIS — R309 Painful micturition, unspecified: Secondary | ICD-10-CM

## 2020-07-22 DIAGNOSIS — G4700 Insomnia, unspecified: Secondary | ICD-10-CM

## 2020-07-22 DIAGNOSIS — K5900 Constipation, unspecified: Secondary | ICD-10-CM

## 2020-07-22 DIAGNOSIS — F411 Generalized anxiety disorder: Secondary | ICD-10-CM

## 2020-07-22 LAB — POCT URINALYSIS DIP (CLINITEK)
Bilirubin, UA: NEGATIVE
Glucose, UA: NEGATIVE mg/dL
Ketones, POC UA: NEGATIVE mg/dL
Leukocytes, UA: NEGATIVE
Nitrite, UA: NEGATIVE
POC PROTEIN,UA: NEGATIVE
Spec Grav, UA: 1.025 (ref 1.010–1.025)
Urobilinogen, UA: 0.2 E.U./dL
pH, UA: 7 (ref 5.0–8.0)

## 2020-07-22 MED ORDER — ARIPIPRAZOLE 5 MG PO TABS: 5.0000 mg | ORAL_TABLET | Freq: Every day | ORAL | 0 refills | Status: DC

## 2020-07-22 MED ORDER — HYDROXYZINE HCL 50 MG PO TABS
ORAL_TABLET | ORAL | 0 refills | Status: DC
Start: 2020-07-22 — End: 2021-07-10

## 2020-07-22 MED ORDER — POLYETHYLENE GLYCOL 3350 17 GM/SCOOP PO POWD: 17.0000 g | Freq: Every day | ORAL | 0 refills | Status: DC

## 2020-07-22 NOTE — Patient Instructions (Addendum)
Return in 4 to 6 weeks or sooner if needed for annual physical examination, labs, and health maintenance. Arrive fasting meaning having had no food and/or nothing to drink for at least 8 hours prior to appointment.  Miralax for constipation.   STI testing and urinalysis today. Will call with results.   Referral to Psychiatry. [facility] [address] [place], [address]. Phone # [phone]. Open 24 hours.  Thank you for choosing [facility] at [facility] for your medical home!    Ann Clark was seen by Ann Fendt, NP today.   Ann Clark's primary care provider is Ann Fendt, NP.   For the best care possible,  you should try to see Ann Stabs, NP whenever you come to clinic.   We look forward to seeing you again soon!  If you have any questions about your visit today,  please call us at [phone]  Or feel free to reach your provider via MyChart.    Bacterial Vaginosis  Bacterial vaginosis is an infection of the vagina. It happens when too many normal germs (healthy bacteria) grow in the vagina. This infection can make it easier to get other infections from sex (STIs). It is very important for pregnant women to get treated. This infection can cause babies to be born early or at a low birth weight. What are the causes? This infection is caused by an increase in certain germs that grow in the vagina. You cannot get this infection from toilet seats, bedsheets, swimming pools, or things that touch your vagina. What increases the risk?  Having sex with a new person or more than one person.  Having sex without protection.  Douching.  Having an intrauterine device (IUD).  Smoking.  Using drugs or drinking alcohol. These can lead you to do things that are risky.  Taking certain antibiotic medicines.  Being pregnant. What are the signs or symptoms? Some women have no symptoms. Symptoms may include:  A discharge  from your vagina. It may be gray or white. It can be watery or foamy.  A fishy smell. This can happen after sex or during your menstrual period.  Itching in and around your vagina.  A feeling of burning or pain when you pee (urinate). How is this treated? This infection is treated with antibiotic medicines. These may be given to you as:  A pill.  A cream for your vagina.  A medicine that you put into your vagina (suppository). If the infection comes back after treatment, you may need more antibiotics. Follow these instructions at home: Medicines  Take over-the-counter and prescription medicines as told by your doctor.  Take or use your antibiotic medicine as told by your doctor. Do not stop taking or using it, even if you start to feel better. General instructions  If the person you have sex with is a woman, tell her that you have this infection. She will need to follow up with her doctor. If you have a female partner, he does not need to be treated.  Do not have sex until you finish treatment.  Drink enough fluid to keep your pee pale yellow.  Keep your vagina and butt clean. ? Wash the area with warm water each day. ? Wipe from front to back after you use the toilet.  If you are breastfeeding a baby, ask your doctor if you should keep doing so during treatment.  Keep all follow-up visits. How is this  prevented? Self-care  Do not douche.  Use only warm water to wash around your vagina.  Wear underwear that is cotton or lined with cotton.  Do not wear tight pants and pantyhose, especially in the summer. Safe sex  Use protection when you have sex. This includes: ? Use condoms. ? Use dental dams. This is a thin layer that protects the mouth during oral sex.  Limit how many people you have sex with. To prevent this infection, it is best to have sex with just one person.  Get tested for STIs. The person you have sex with should also get tested. Drugs and  alcohol  Do not smoke or use any products that contain nicotine or tobacco. If you need help quitting, ask your doctor.  Do not use drugs.  Do not drink alcohol if: ? Your doctor tells you not to drink. ? You are pregnant, may be pregnant, or are planning to become pregnant.  If you drink alcohol: ? Limit how much you have to 0-1 drink a day. ? Know how much alcohol is in your drink. In the [place] one drink equals one 12 oz bottle of beer (355 mL), one 5 oz glass of wine (148 mL), or one 1 oz glass of hard liquor (44 mL). Where to find more information  Centers for Disease Control and Prevention: FootballExhibition.com.br  American Sexual Health Association: www.ashastd.org  Office on [employer] Health: http://hoffman.com/ Contact a doctor if:  Your symptoms do not get better, even after you are treated.  You have more discharge or pain when you pee.  You have a fever or chills.  You have pain in your belly (abdomen) or in the area between your hips.  You have pain with sex.  You bleed from your vagina between menstrual periods. Summary  This infection can happen when too many germs (bacteria) grow in the vagina.  This infection can make it easier to get infections from sex (STIs). Treating this can lower that chance.  Get treated if you are pregnant. This infection can cause babies to be born early.  Do not stop taking or using your antibiotic medicine, even if you start to feel better. This information is not intended to replace advice given to you by your health care provider. Make sure you discuss any questions you have with your health care provider. Document Revised: 12/13/2019 Document Reviewed: 12/13/2019 Elsevier Patient Education  2021 [employer].

## 2020-07-22 NOTE — Progress Notes (Signed)
Establish care Recurrent BV  Bruising easily and long healing process-former dancer Wants full bloodwork panel needs to rule out lupus, diabetes, cholesterol,check iron levels Needs refill Abilify and ibuprofen 600mg 

## 2020-07-23 LAB — CERVICOVAGINAL ANCILLARY ONLY
Bacterial Vaginitis (gardnerella): NEGATIVE
Candida Glabrata: NEGATIVE
Candida Vaginitis: NEGATIVE
Chlamydia: NEGATIVE
Comment: NEGATIVE
Comment: NEGATIVE
Comment: NEGATIVE
Comment: NEGATIVE
Comment: NEGATIVE
Comment: NORMAL
Neisseria Gonorrhea: NEGATIVE
Trichomonas: NEGATIVE

## 2020-07-23 NOTE — Progress Notes (Signed)
Please call patient with update.   Negative for chlamydia, gonorrhea, trichomonas, yeast infection, and bacterial vaginitis.   Urinalysis discussed in clinic.

## 2020-07-24 ENCOUNTER — Ambulatory Visit: Payer: Medicaid Other

## 2020-08-06 ENCOUNTER — Encounter: Payer: Medicaid Other | Admitting: Family

## 2020-08-08 ENCOUNTER — Other Ambulatory Visit: Payer: Self-pay

## 2020-08-08 ENCOUNTER — Ambulatory Visit (INDEPENDENT_AMBULATORY_CARE_PROVIDER_SITE_OTHER): Payer: Medicaid Other | Admitting: Family

## 2020-08-08 DIAGNOSIS — Z13228 Encounter for screening for other metabolic disorders: Secondary | ICD-10-CM | POA: Diagnosis not present

## 2020-08-08 DIAGNOSIS — Z0000 Encounter for general adult medical examination without abnormal findings: Secondary | ICD-10-CM | POA: Diagnosis not present

## 2020-08-08 DIAGNOSIS — R0789 Other chest pain: Secondary | ICD-10-CM | POA: Diagnosis not present

## 2020-08-08 DIAGNOSIS — Z131 Encounter for screening for diabetes mellitus: Secondary | ICD-10-CM

## 2020-08-08 DIAGNOSIS — Z130 Encounter for screening for diseases of the blood and blood-forming organs and certain disorders involving the immune mechanism: Secondary | ICD-10-CM

## 2020-08-08 DIAGNOSIS — Z13220 Encounter for screening for lipoid disorders: Secondary | ICD-10-CM | POA: Diagnosis not present

## 2020-08-08 DIAGNOSIS — Z7689 Persons encountering health services in other specified circumstances: Secondary | ICD-10-CM

## 2020-08-08 DIAGNOSIS — Z1329 Encounter for screening for other suspected endocrine disorder: Secondary | ICD-10-CM

## 2020-08-08 DIAGNOSIS — R102 Pelvic and perineal pain: Secondary | ICD-10-CM | POA: Diagnosis not present

## 2020-08-08 NOTE — Progress Notes (Signed)
Physical  Pt still feeling she on menstrual cycle when not Referral to gyn

## 2020-08-08 NOTE — Patient Instructions (Signed)
Annual physical exam and labs today. Will call with results.   Referral to Gynecology.  Follow-up with primary provider as needed.   Preventive Care 11-37 Years Old, Female Preventive care refers to lifestyle choices and visits with your health care provider that can promote health and wellness. This includes:  A yearly physical exam. This is also called an annual wellness visit.  Regular dental and eye exams.  Immunizations.  Screening for certain conditions.  Healthy lifestyle choices, such as: ? Eating a healthy diet. ? Getting regular exercise. ? Not using drugs or products that contain nicotine and tobacco. ? Limiting alcohol use. What can I expect for my preventive care visit? Physical exam Your health care provider may check your:  Height and weight. These may be used to calculate your BMI (body mass index). BMI is a measurement that tells if you are at a healthy weight.  Heart rate and blood pressure.  Body temperature.  Skin for abnormal spots. Counseling Your health care provider may ask you questions about your:  Past medical problems.  Family's medical history.  Alcohol, tobacco, and drug use.  Emotional well-being.  Home life and relationship well-being.  Sexual activity.  Diet, exercise, and sleep habits.  Work and work Statistician.  Access to firearms.  Method of birth control.  Menstrual cycle.  Pregnancy history. What immunizations do I need? Vaccines are usually given at various ages, according to a schedule. Your health care provider will recommend vaccines for you based on your age, medical history, and lifestyle or other factors, such as travel or where you work.   What tests do I need? Blood tests  Lipid and cholesterol levels. These may be checked every 5 years starting at age 44.  Hepatitis C test.  Hepatitis B test. Screening  Diabetes screening. This is done by checking your blood sugar (glucose) after you have not  eaten for a while (fasting).  STD (sexually transmitted disease) testing, if you are at risk.  BRCA-related cancer screening. This may be done if you have a family history of breast, ovarian, tubal, or peritoneal cancers.  Pelvic exam and Pap test. This may be done every 3 years starting at age 60. Starting at age 70, this may be done every 5 years if you have a Pap test in combination with an HPV test. Talk with your health care provider about your test results, treatment options, and if necessary, the need for more tests.   Follow these instructions at home: Eating and drinking  Eat a healthy diet that includes fresh fruits and vegetables, whole grains, lean protein, and low-fat dairy products.  Take vitamin and mineral supplements as recommended by your health care provider.  Do not drink alcohol if: ? Your health care provider tells you not to drink. ? You are pregnant, may be pregnant, or are planning to become pregnant.  If you drink alcohol: ? Limit how much you have to 0-1 drink a day. ? Be aware of how much alcohol is in your drink. In the [place] one drink equals one 12 oz bottle of beer (355 mL), one 5 oz glass of wine (148 mL), or one 1 oz glass of hard liquor (44 mL).   Lifestyle  Take daily care of your teeth and gums. Brush your teeth every morning and night with fluoride toothpaste. Floss one time each day.  Stay active. Exercise for at least 30 minutes 5 or more days each week.  Do not use any products  that contain nicotine or tobacco, such as cigarettes, e-cigarettes, and chewing tobacco. If you need help quitting, ask your health care provider.  Do not use drugs.  If you are sexually active, practice safe sex. Use a condom or other form of protection to prevent STIs (sexually transmitted infections).  If you do not wish to become pregnant, use a form of birth control. If you plan to become pregnant, see your health care provider for a prepregnancy visit.  Find  healthy ways to cope with stress, such as: ? Meditation, yoga, or listening to music. ? Journaling. ? Talking to a trusted person. ? Spending time with friends and family. Safety  Always wear your seat belt while driving or riding in a vehicle.  Do not drive: ? If you have been drinking alcohol. Do not ride with someone who has been drinking. ? When you are tired or distracted. ? While texting.  Wear a helmet and other protective equipment during sports activities.  If you have firearms in your house, make sure you follow all gun safety procedures.  Seek help if you have been physically or sexually abused. What's next?  Go to your health care provider once a year for an annual wellness visit.  Ask your health care provider how often you should have your eyes and teeth checked.  Stay up to date on all vaccines. This information is not intended to replace advice given to you by your health care provider. Make sure you discuss any questions you have with your health care provider. Document Revised: 02/10/2020 Document Reviewed: 02/23/2018 Elsevier Patient Education  2021 [employer].

## 2020-08-08 NOTE — Progress Notes (Signed)
Patient ID: Ann Clark, female    DOB: 07/22/1985  MRN: [id number]  CC: Annual Physical Exam   Subjective: Ann Clark is a 35 y.o. female who presents for annual physical exam.  Her concerns today include:   1. PELVIC DISCOMFORT: Pelvic pressure and pain for about 5 months. Described as cramping, aching, comes and goes. Feels like her she is having her menstrual cycle but she is not. Having a normal menstrual cycle of 5 days. Denies abnormal bleeding. Would like referral to Gynecology.   2. CHEST PAIN: Had chest pain in the past and resolved for about 3 months. Returned recently. Sharp pain which comes and goes. Occurs randomly.    Patient Active Problem List   Diagnosis Date Noted  . Insomnia 06/25/2020  . Acute cystitis without hematuria 06/11/2020  . Bacterial vaginitis 06/11/2020  . Anxiety state 06/11/2020  . H. pylori infection 02/27/2020  . Vitamin D deficiency 02/27/2020  . Myalgia and myositis 07/30/2015  . GERD (gastroesophageal reflux disease) 07/30/2015  . Arthralgia 07/16/2015  . Bipolar I disorder (HCC) 07/16/2015     Current Outpatient Medications on File Prior to Visit  Medication Sig Dispense Refill  . ARIPiprazole (ABILIFY) 5 MG tablet Take 1 tablet (5 mg total) by mouth daily. 30 tablet 0  . hydrOXYzine (ATARAX/VISTARIL) 50 MG tablet Take 1/2 - 1 full tablets QHS prn for insomnia 30 tablet 0  . ibuprofen (ADVIL) 600 MG tablet Take 1 tablet (600 mg total) by mouth every 8 (eight) hours as needed. 30 tablet 1  . omeprazole (PRILOSEC) 40 MG capsule Take 1 capsule (40 mg total) by mouth daily. 90 capsule 1  . polyethylene glycol powder (GLYCOLAX/MIRALAX) 17 GM/SCOOP powder Take 17 g by mouth daily. 3350 g 0  . tiZANidine (ZANAFLEX) 4 MG tablet 1/2 tablet every 8 hours as needed for muscle spasm and may take 1 whole tablet at bedtime if needed 20 tablet 0  . Vitamin D, Ergocalciferol, (DRISDOL) 1.25 MG (50000 UNIT) CAPS capsule Take 1 capsule (50,000  Units total) by mouth every 7 (seven) days. 4 capsule 2   No current facility-administered medications on file prior to visit.    No Known Allergies  Social History   Socioeconomic History  . Marital status: Single    Spouse name: Not on file  . Number of children: Not on file  . Years of education: Not on file  . Highest education level: Not on file  Occupational History  . Not on file  Tobacco Use  . Smoking status: Light Tobacco Smoker    Packs/day: 0.25    Types: Cigarettes  . Smokeless tobacco: Never Used  . Tobacco comment: occasional  Vaping Use  . Vaping Use: Never used  Substance and Sexual Activity  . Alcohol use: Yes    Alcohol/week: 2.0 - 3.0 standard drinks    Types: 2 - 3 Shots of liquor per week    Comment: two days weekly  . Drug use: Yes    Types: Marijuana    Comment: 4 times monthly  . Sexual activity: Yes  Other Topics Concern  . Not on file  Social History Narrative  . Not on file   Social Determinants of Health   Financial Resource Strain: Not on file  Food Insecurity: Not on file  Transportation Needs: Not on file  Physical Activity: Not on file  Stress: Not on file  Social Connections: Not on file  Intimate Partner Violence: Not on file  Family History  Problem Relation Age of Onset  . COPD Mother   . Diabetes Mother   . Hyperlipidemia Mother   . Hypertension Mother   . Anxiety disorder Mother   . Arthritis Mother   . Asthma Mother   . Depression Mother   . Drug abuse Mother   . Vision loss Mother   . Kidney disease Father   . Drug abuse Father   . Heart Problems Father   . ADD / ADHD Sister   . Heart disease Sister   . Hearing loss Son     Past Surgical History:  Procedure Laterality Date  . EUSTACHIAN TUBE DILATION    . TONSILLECTOMY      ROS: Review of Systems Negative except as stated above  PHYSICAL EXAM:  Physical Exam Exam conducted with a chaperone present.  HENT:     Head: Normocephalic and  atraumatic.     Right Ear: Tympanic membrane, ear canal and external ear normal.     Left Ear: Tympanic membrane, ear canal and external ear normal.     Nose: Nose normal.     Mouth/Throat:     Mouth: Mucous membranes are moist.     Pharynx: Oropharynx is clear.  Eyes:     Extraocular Movements: Extraocular movements intact.     Conjunctiva/sclera: Conjunctivae normal.     Pupils: Pupils are equal, round, and reactive to light.  Cardiovascular:     Pulses: Normal pulses.     Heart sounds: Normal heart sounds.  Pulmonary:     Effort: Pulmonary effort is normal.     Breath sounds: Normal breath sounds.  Chest:  Breasts:     Right: Normal.     Left: Normal.      Comments: Margorie John, CMA present during examination.  Abdominal:     General: Bowel sounds are normal.     Palpations: Abdomen is soft.  Genitourinary:    Comments: Patient declined examination. Musculoskeletal:        General: Normal range of motion.     Cervical back: Normal range of motion and neck supple.  Skin:    General: Skin is warm and dry.     Capillary Refill: Capillary refill takes less than 2 seconds.  Neurological:     General: No focal deficit present.     Mental Status: She is alert and oriented to person, place, and time.  Psychiatric:        Mood and Affect: Mood normal.        Behavior: Behavior normal.     ASSESSMENT AND PLAN: 1. Annual physical exam: - Counseled on 150 minutes of exercise per week as tolerated, healthy eating (including decreased daily intake of saturated fats, cholesterol, added sugars, sodium), STI prevention, and routine healthcare maintenance.  2. Screening for metabolic disorder: - CMP to check kidney function, liver function, and electrolyte balance.  - Comprehensive metabolic panel  3. Diabetes mellitus screening: - Hemoglobin A1c to screen for pre-diabetes and diabetes.  - Hemoglobin A1c  4. Screening cholesterol level: - Lipid panel to screen for high  cholesterol. - Lipid Panel  5. Screening, iron deficiency anemia: - CBC to screen for anemia.  - CBC  6. Thyroid disorder screen: - TSH to check thyroid function. - TSH  7. Pelvic pressure in female: - Pelvic pressure and pain for about 5 months. Described as cramping, aching, comes and goes. Feels like her she is having her menstrual cycle but she is not. Having  a normal menstrual cycle of 5 days. Denies abnormal bleeding. Would like referral to Gynecology. - Per patient request referral to Obstetrics / Gynecology for further evaluation and management. - Ambulatory referral to Obstetrics / Gynecology   Patient was given the opportunity to ask questions.  Patient verbalized understanding of the plan and was able to repeat key elements of the plan. Patient was given clear instructions to go to Emergency Department or return to medical center if symptoms don't improve, worsen, or new problems develop.The patient verbalized understanding.   Orders Placed This Encounter  Procedures  . Comprehensive metabolic panel  . CBC  . Hemoglobin A1c  . Lipid Panel  . TSH  . Ambulatory referral to Obstetrics / Gynecology     Requested Prescriptions    No prescriptions requested or ordered in this encounter    Samyah Bilbo Jodi Geralds, NP

## 2020-08-09 LAB — TSH: TSH: 1.4 u[IU]/mL (ref 0.450–4.500)

## 2020-08-09 LAB — COMPREHENSIVE METABOLIC PANEL
ALT: 17 IU/L (ref 0–32)
AST: 18 IU/L (ref 0–40)
Albumin/Globulin Ratio: 1.8 (ref 1.2–2.2)
Albumin: 4.4 g/dL (ref 3.8–4.8)
Alkaline Phosphatase: 62 IU/L (ref 44–121)
BUN/Creatinine Ratio: 12 (ref 9–23)
BUN: 10 mg/dL (ref 6–20)
Bilirubin Total: 0.5 mg/dL (ref 0.0–1.2)
CO2: 19 mmol/L — ABNORMAL LOW (ref 20–29)
Calcium: 9 mg/dL (ref 8.7–10.2)
Chloride: 103 mmol/L (ref 96–106)
Creatinine, Ser: 0.84 mg/dL (ref 0.57–1.00)
GFR calc Af Amer: 103 mL/min/{1.73_m2} (ref >59)
GFR calc non Af Amer: 90 mL/min/{1.73_m2} (ref >59)
Globulin, Total: 2.5 g/dL (ref 1.5–4.5)
Glucose: 83 mg/dL (ref 65–99)
Potassium: 4.2 mmol/L (ref 3.5–5.2)
Sodium: 137 mmol/L (ref 134–144)
Total Protein: 6.9 g/dL (ref 6.0–8.5)

## 2020-08-09 LAB — HEMOGLOBIN A1C
Est. average glucose Bld gHb Est-mCnc: 105 mg/dL
Hgb A1c MFr Bld: 5.3 % (ref 4.8–5.6)

## 2020-08-09 LAB — LIPID PANEL
Chol/HDL Ratio: 3.6 ratio (ref 0.0–4.4)
Cholesterol, Total: 193 mg/dL (ref 100–199)
HDL: 54 mg/dL (ref >39)
LDL Chol Calc (NIH): 124 mg/dL — ABNORMAL HIGH (ref 0–99)
Triglycerides: 82 mg/dL (ref 0–149)
VLDL Cholesterol Cal: 15 mg/dL (ref 5–40)

## 2020-08-09 LAB — CBC
Hematocrit: 37.1 % (ref 34.0–46.6)
Hemoglobin: 12 g/dL (ref 11.1–15.9)
MCH: 24.1 pg — ABNORMAL LOW (ref 26.6–33.0)
MCHC: 32.3 g/dL (ref 31.5–35.7)
MCV: 75 fL — ABNORMAL LOW (ref 79–97)
Platelets: 259 10*3/uL (ref 150–450)
RBC: 4.98 x10E6/uL (ref 3.77–5.28)
RDW: 14.7 % (ref 11.7–15.4)
WBC: 6.4 10*3/uL (ref 3.4–10.8)

## 2020-08-11 NOTE — Progress Notes (Signed)
Please call patient with update.   Kidney function normal.   Liver function normal.   Thyroid normal.   No pre-diabetes/diabetes.   Adding iron panel to check for possible iron deficiency. Please schedule appointment for lab visit while patient is on the phone.   LDL cholesterol, also referred to as "bad cholesterol", higher than normal. This may increase risk of heart attack and/or stroke. Consider eating more fruits, vegetables, and lean baked meats such as chicken or fish. Moderate intensity exercise at least 150 minutes as tolerated per week may help as well.

## 2020-08-11 NOTE — Addendum Note (Signed)
Addended by: Rema Fendt on: 08/11/2020 11:21 AM   Modules accepted: Orders

## 2020-09-01 ENCOUNTER — Encounter: Payer: Medicaid Other | Admitting: Family Medicine

## 2020-10-29 ENCOUNTER — Emergency Department (HOSPITAL_COMMUNITY): Payer: Medicaid Other

## 2020-10-29 ENCOUNTER — Other Ambulatory Visit: Payer: Self-pay

## 2020-10-29 ENCOUNTER — Emergency Department (HOSPITAL_COMMUNITY)
Admission: EM | Admit: 2020-10-29 | Discharge: 2020-10-29 | Discharge status: Against Medical Advice | Payer: Medicaid Other | Attending: Emergency Medicine | Admitting: Emergency Medicine

## 2020-10-29 ENCOUNTER — Encounter (HOSPITAL_COMMUNITY): Payer: Self-pay

## 2020-10-29 DIAGNOSIS — R202 Paresthesia of skin: Secondary | ICD-10-CM | POA: Insufficient documentation

## 2020-10-29 DIAGNOSIS — F17210 Nicotine dependence, cigarettes, uncomplicated: Secondary | ICD-10-CM | POA: Insufficient documentation

## 2020-10-29 DIAGNOSIS — R519 Headache, unspecified: Secondary | ICD-10-CM | POA: Diagnosis present

## 2020-10-29 LAB — I-STAT BETA HCG BLOOD, ED (MC, WL, AP ONLY): I-stat hCG, quantitative: <5 m[IU]/mL (ref <5)

## 2020-10-29 MED ORDER — SODIUM CHLORIDE 0.9 % IV BOLUS
1000.0000 mL | Freq: Once | INTRAVENOUS | Status: AC
  Administered 2020-10-29: 1000 mL via INTRAVENOUS

## 2020-10-29 MED ORDER — DIPHENHYDRAMINE HCL 50 MG/ML IJ SOLN
12.5000 mg | Freq: Once | INTRAMUSCULAR | Status: AC
  Administered 2020-10-29: 12.5 mg via INTRAVENOUS
  Filled 2020-10-29: qty 1

## 2020-10-29 MED ORDER — PROCHLORPERAZINE EDISYLATE 10 MG/2ML IJ SOLN
10.0000 mg | Freq: Once | INTRAMUSCULAR | Status: AC
  Administered 2020-10-29: 10 mg via INTRAVENOUS
  Filled 2020-10-29: qty 2

## 2020-10-29 NOTE — ED Notes (Signed)
Pt has eloped. Pt did not inform staff that she was leaving. Pt had a peripheral IV right wrist, no IV catheter seen in room. Staff did not remove IV from pt. Britni Henderly, PA aware.

## 2020-10-29 NOTE — ED Provider Notes (Signed)
[facility]-EMERGENCY DEPT Provider Note   CSN: [id number] Arrival date & time: 10/29/20  1046     History Chief Complaint  Patient presents with  . Headache    Ann Clark is a 35 y.o. female with past medical history significant for anemia, bipolar who presents for evaluation of headache.  Patient states she has had intermittent headache over the last 2 weeks.  Headache occurs in different locations.  She denies any recent trauma or injuries.  Denies any sudden onset thunderclap headache.  No pain, stiffness to neck.  Does states she intermittently gets pain and tingling to her left hand.  None currently.  States this typically occurs when she leaves her arm in the same position.  She denies any facial droop, difficulty with word finding, auras, visual field deficits.  No current paresthesias or weakness.  States today she woke up with a headache on the right side of her head then went to the left posterior occiput and is now located over the front of her left head.  Pain is not worse with position changes.  No recent procedures, specifically LPs.  No prior history of malignancies.  Rates her current pain a 6/10.  No photophobia or phonophobia.  Does have history of migraines however has not had one over the last few years.  No recent chiropractor adjustments.  Denies additional aggravating or alleviating factors.  History obtained from patient and past medical records.  No interpreter used  HPI     Past Medical History:  Diagnosis Date  . Anemia   . Anxiety   . Preterm labor   . Shoulder pain   . Trichomonas infection 08/01/2015    Patient Active Problem List   Diagnosis Date Noted  . Insomnia 06/25/2020  . Acute cystitis without hematuria 06/11/2020  . Bacterial vaginitis 06/11/2020  . Anxiety state 06/11/2020  . H. pylori infection 02/27/2020  . Vitamin D deficiency 02/27/2020  . Myalgia and myositis 07/30/2015  . GERD (gastroesophageal reflux disease)  07/30/2015  . Arthralgia 07/16/2015  . Bipolar I disorder (HCC) 07/16/2015    Past Surgical History:  Procedure Laterality Date  . EUSTACHIAN TUBE DILATION    . TONSILLECTOMY       OB History    Gravida  2   Para  1   Term      Preterm  1   AB      Living  1     SAB      IAB      Ectopic      Multiple      Live Births  1           Family History  Problem Relation Age of Onset  . COPD Mother   . Diabetes Mother   . Hyperlipidemia Mother   . Hypertension Mother   . Anxiety disorder Mother   . Arthritis Mother   . Asthma Mother   . Depression Mother   . Drug abuse Mother   . Vision loss Mother   . Kidney disease Father   . Drug abuse Father   . Heart Problems Father   . ADD / ADHD Sister   . Heart disease Sister   . Hearing loss Son     Social History   Tobacco Use  . Smoking status: Light Tobacco Smoker    Packs/day: 0.25    Types: Cigarettes  . Smokeless tobacco: Never Used  . Tobacco comment: occasional  Vaping Use  .  Vaping Use: Never used  Substance Use Topics  . Alcohol use: Yes    Alcohol/week: 2.0 - 3.0 standard drinks    Types: 2 - 3 Shots of liquor per week    Comment: two days weekly  . Drug use: Yes    Types: Marijuana    Comment: 4 times monthly    Home Medications Prior to Admission medications   Medication Sig Start Date End Date Taking? Authorizing Provider  ARIPiprazole (ABILIFY) 5 MG tablet Take 1 tablet (5 mg total) by mouth daily. 07/22/20   Rema Fendt, NP  hydrOXYzine (ATARAX/VISTARIL) 50 MG tablet Take 1/2 - 1 full tablets QHS prn for insomnia 07/22/20   Rema Fendt, NP  ibuprofen (ADVIL) 600 MG tablet Take 1 tablet (600 mg total) by mouth every 8 (eight) hours as needed. 01/07/20   Hoy Register, MD  metroNIDAZOLE (FLAGYL) 500 MG tablet TAKE 1 TABLET (500 MG TOTAL) BY MOUTH 2 (TWO) TIMES DAILY FOR 7 DAYS. 06/11/20 06/11/21  Mayers, Cari S, PA-C  omeprazole (PRILOSEC) 40 MG capsule TAKE 1 CAPSULE (40 MG  TOTAL) BY MOUTH DAILY. 06/10/20 06/10/21  Mayers, Cari S, PA-C  polyethylene glycol powder (GLYCOLAX/MIRALAX) 17 GM/SCOOP powder Take 17 g by mouth daily. 07/22/20   Rema Fendt, NP  tiZANidine (ZANAFLEX) 4 MG tablet 1/2 tablet every 8 hours as needed for muscle spasm and may take 1 whole tablet at bedtime if needed 02/20/20   Mayers, Cari S, PA-C  Vitamin D, Ergocalciferol, (DRISDOL) 1.25 MG (50000 UNIT) CAPS capsule TAKE 1 CAPSULE (50,000 UNITS TOTAL) BY MOUTH EVERY 7 (SEVEN) DAYS. 06/10/20 06/10/21  Mayers, Kasandra Knudsen, PA-C    Allergies    Patient has no known allergies.  Review of Systems   Review of Systems  Constitutional: Negative.   HENT: Negative.   Respiratory: Negative.   Cardiovascular: Negative.   Gastrointestinal: Negative.   Genitourinary: Negative.   Musculoskeletal: Negative.   Skin: Negative.   Neurological: Positive for numbness (Intermittent left hand numbness) and headaches. Negative for dizziness, tremors, seizures, syncope, facial asymmetry, speech difficulty, weakness and light-headedness.  All other systems reviewed and are negative.   Physical Exam Updated Vital Signs BP 116/74 (BP Location: Left Arm)   Pulse 68   Temp 98.3 F (36.8 C) (Oral)   Resp 15   LMP 10/22/2020   SpO2 100%   Physical Exam  Physical Exam  Constitutional: Pt is oriented to person, place, and time. Pt appears well-developed and well-nourished. No distress.  HENT:  Head: Normocephalic and atraumatic.  Mouth/Throat: Oropharynx is clear and moist.  Eyes: Conjunctivae and EOM are normal. Pupils are equal, round, and reactive to light. No scleral icterus.  No horizontal, vertical or rotational nystagmus  Neck: Normal range of motion. Neck supple.  Full active and passive ROM without pain No midline or paraspinal tenderness No nuchal rigidity or meningeal signs  Cardiovascular: Normal rate, regular rhythm and intact distal pulses.   Pulmonary/Chest: Effort normal and breath sounds  normal. No respiratory distress. Pt has no wheezes. No rales.  Abdominal: Soft. Bowel sounds are normal. There is no tenderness. There is no rebound and no guarding.  Musculoskeletal: Normal range of motion.  Lymphadenopathy:    No cervical adenopathy.  Neurological: Pt. is alert and oriented to person, place, and time. He has normal reflexes. No cranial nerve deficit.  Exhibits normal muscle tone. Coordination normal.  Mental Status:  Alert, oriented, thought content appropriate. Speech fluent without evidence of aphasia. Able  to follow 2 step commands without difficulty.  Cranial Nerves:  II:  Peripheral visual fields grossly normal, pupils equal, round, reactive to light III,IV, VI: ptosis not present, extra-ocular motions intact bilaterally  V,VII: smile symmetric, facial light touch sensation equal VIII: hearing grossly normal bilaterally  IX,X: midline uvula rise  XI: bilateral shoulder shrug equal and strong XII: midline tongue extension  Motor:  5/5 in upper and lower extremities bilaterally including strong and equal grip strength and dorsiflexion/plantar flexion Sensory: Pinprick and light touch normal in all extremities.  Deep Tendon Reflexes: 2+ and symmetric  Cerebellar: normal finger-to-nose with bilateral upper extremities Gait: normal gait and balance CV: distal pulses palpable throughout   Skin: Skin is warm and dry. No rash noted. Pt is not diaphoretic.  Psychiatric: Pt has a normal mood and affect. Behavior is normal. Judgment and thought content normal.  Nursing note and vitals reviewed. ED Results / Procedures / Treatments   Labs (all labs ordered are listed, but only abnormal results are displayed) Labs Reviewed  I-STAT BETA HCG BLOOD, ED ([facility], [facility], [facility] ONLY)    EKG None  Radiology CT Head Wo Contrast  Result Date: 10/29/2020 CLINICAL DATA:  Headache, chronic, new features or increased frequency. Headache for 2-3 weeks, increasing. EXAM: CT HEAD WITHOUT  CONTRAST TECHNIQUE: Contiguous axial images were obtained from the base of the skull through the vertex without intravenous contrast. COMPARISON:  No pertinent prior exams available for comparison. FINDINGS: Brain: Cerebral volume is normal. There is no acute intracranial hemorrhage. No demarcated cortical infarct. No extra-axial fluid collection. No evidence of intracranial mass. No midline shift. Vascular: No hyperdense vessel. Skull: Normal. Negative for fracture or focal lesion. Sinuses/Orbits: Visualized orbits show no acute finding. No significant paranasal sinus disease at the imaged levels. IMPRESSION: Unremarkable noncontrast CT appearance of the brain. No evidence of acute intracranial abnormality. Electronically Signed   By: Jackey LogeKyle  Golden DO   On: 10/29/2020 13:04    Procedures Procedures   Medications Ordered in ED Medications  sodium chloride 0.9 % bolus 1,000 mL (0 mLs Intravenous Stopped 10/29/20 1437)  prochlorperazine (COMPAZINE) injection 10 mg (10 mg Intravenous Given 10/29/20 1246)  diphenhydrAMINE (BENADRYL) injection 12.5 mg (12.5 mg Intravenous Given 10/29/20 1245)    ED Course  I have reviewed the triage vital signs and the nursing notes.  Pertinent labs & imaging results that were available during my care of the patient were reviewed by me and considered in my medical decision making (see chart for details).  35 year old presents for evaluation of headache.  She is afebrile, nonseptic, not ill-appearing.  No recent trauma or injuries.  No sudden onset thunderclap headache.  She is a nonfocal neuro exam without deficits.  She does endorse intermittent left hand pain and paresthesias however this seems to be positional nature.  She has no current neurologic deficits on exam.  Remote history of migraines however none recently.  Plan on CT head, migraine cocktail and reassess.  CT head without significant findings  I went to reassess patient. Not in room. Will recheck.  Went to  recheck patient. Still not in room.  Patient appears to have eloped from the ED prior to reassessment.    MDM Rules/Calculators/A&P                           Final Clinical Impression(s) / ED Diagnoses Final diagnoses:  Acute nonintractable headache, unspecified headache type    Rx / DC  Orders ED Discharge Orders    None       Alder Murri A, PA-C 10/29/20 1437    Milagros Loll, MD 10/31/20 2006

## 2020-10-29 NOTE — ED Triage Notes (Addendum)
Pt arrived via POV, c/o intermittent headache x2 weeks, moving throughout head. States she has also had intermittent pain/numbness in left arm and leg when she leaves it in one spot for too long. Denies any auras, visual deficits. No focal deficits.

## 2020-11-06 ENCOUNTER — Other Ambulatory Visit (HOSPITAL_COMMUNITY)
Admission: RE | Admit: 2020-11-06 | Discharge: 2020-11-06 | Disposition: A | Discharge status: Home / Self Care | Payer: Medicaid Other | Source: Ambulatory Visit | Attending: Obstetrics | Admitting: Obstetrics

## 2020-11-06 ENCOUNTER — Ambulatory Visit: Payer: Medicaid Other | Admitting: Obstetrics

## 2020-11-06 ENCOUNTER — Other Ambulatory Visit: Payer: Self-pay

## 2020-11-06 VITALS — BP 111/72 | HR 66 | Wt 166.0 lb

## 2020-11-06 DIAGNOSIS — K5900 Constipation, unspecified: Secondary | ICD-10-CM | POA: Diagnosis not present

## 2020-11-06 DIAGNOSIS — K219 Gastro-esophageal reflux disease without esophagitis: Secondary | ICD-10-CM

## 2020-11-06 DIAGNOSIS — N946 Dysmenorrhea, unspecified: Secondary | ICD-10-CM | POA: Diagnosis not present

## 2020-11-06 DIAGNOSIS — R102 Pelvic and perineal pain: Secondary | ICD-10-CM | POA: Diagnosis not present

## 2020-11-06 DIAGNOSIS — E569 Vitamin deficiency, unspecified: Secondary | ICD-10-CM

## 2020-11-06 DIAGNOSIS — Z30013 Encounter for initial prescription of injectable contraceptive: Secondary | ICD-10-CM

## 2020-11-06 DIAGNOSIS — Z3009 Encounter for other general counseling and advice on contraception: Secondary | ICD-10-CM

## 2020-11-06 DIAGNOSIS — R109 Unspecified abdominal pain: Secondary | ICD-10-CM

## 2020-11-06 DIAGNOSIS — N898 Other specified noninflammatory disorders of vagina: Secondary | ICD-10-CM

## 2020-11-06 LAB — POCT URINE PREGNANCY: Preg Test, Ur: NEGATIVE

## 2020-11-06 MED ORDER — IBUPROFEN 800 MG PO TABS: 800.0000 mg | ORAL_TABLET | Freq: Three times a day (TID) | ORAL | 5 refills | Status: DC | PRN

## 2020-11-06 MED ORDER — OMEPRAZOLE 40 MG PO CPDR
40.0000 mg | DELAYED_RELEASE_CAPSULE | Freq: Every day | ORAL | 11 refills | Status: DC
Start: 2020-11-06 — End: 2020-11-06

## 2020-11-06 MED ORDER — VITAFOL ULTRA 29-0.6-0.4-200 MG PO CAPS: 1.0000 | ORAL_CAPSULE | Freq: Every day | ORAL | 4 refills | Status: DC

## 2020-11-06 MED ORDER — DOCUSATE SODIUM 100 MG PO CAPS: 100.0000 mg | ORAL_CAPSULE | Freq: Two times a day (BID) | ORAL | 11 refills | Status: DC

## 2020-11-06 MED ORDER — MEDROXYPROGESTERONE ACETATE 150 MG/ML IM SUSP: 150.0000 mg | INTRAMUSCULAR | 4 refills | Status: DC

## 2020-11-06 MED ORDER — OMEPRAZOLE 40 MG PO CPDR: 40.0000 mg | DELAYED_RELEASE_CAPSULE | Freq: Every day | ORAL | 11 refills | Status: DC

## 2020-11-06 MED ORDER — DOCUSATE SODIUM 100 MG PO CAPS
100.0000 mg | ORAL_CAPSULE | Freq: Two times a day (BID) | ORAL | 11 refills | Status: DC
Start: 2020-11-06 — End: 2021-07-10

## 2020-11-06 NOTE — Addendum Note (Signed)
Addended by: Marya Landry D on: 11/06/2020 11:11 AM   Modules accepted: Orders

## 2020-11-06 NOTE — Progress Notes (Signed)
Pt states she has been having abd pain x 1 year, desribes as severe cramping and pain through vagina.   Pt states she had a miscarriage  About 1 year ago and had ?D&C.   Pt states cycles are heavy and painful.  Pt has no form of birth control.

## 2020-11-06 NOTE — Progress Notes (Signed)
Patient ID: Ann Clark, female   DOB: [birth year], 35 y.o.   MRN: [id number]  Chief Complaint  Patient presents with  . Gynecologic Exam    HPI Ann Clark is a 35 y.o. female.  Complains of lower abdominal pain and heavy and painful periods for the past year.  The pain started after having a miscarriage last year.  Also has vaginal discharge.  Has had bad constipation since childhood.  Has to take a laxative weekly.  She was told by her PCP that she has IBS.  Also has GERD. HPI  Past Medical History:  Diagnosis Date  . Anemia   . Anxiety   . Preterm labor   . Shoulder pain   . Trichomonas infection 08/01/2015    Past Surgical History:  Procedure Laterality Date  . EUSTACHIAN TUBE DILATION    . TONSILLECTOMY      Family History  Problem Relation Age of Onset  . COPD Mother   . Diabetes Mother   . Hyperlipidemia Mother   . Hypertension Mother   . Anxiety disorder Mother   . Arthritis Mother   . Asthma Mother   . Depression Mother   . Drug abuse Mother   . Vision loss Mother   . Kidney disease Father   . Drug abuse Father   . Heart Problems Father   . ADD / ADHD Sister   . Heart disease Sister   . Hearing loss Son     Social History Social History   Tobacco Use  . Smoking status: Light Tobacco Smoker    Packs/day: 0.25    Types: Cigarettes  . Smokeless tobacco: Never Used  . Tobacco comment: occasional  Vaping Use  . Vaping Use: Never used  Substance Use Topics  . Alcohol use: Yes    Alcohol/week: 2.0 - 3.0 standard drinks    Types: 2 - 3 Shots of liquor per week    Comment: two days weekly  . Drug use: Yes    Types: Marijuana    Comment: 4 times monthly    No Known Allergies  Current Outpatient Medications  Medication Sig Dispense Refill  . ibuprofen (ADVIL) 800 MG tablet Take 1 tablet (800 mg total) by mouth every 8 (eight) hours as needed. 30 tablet 5  . Prenat-Fe Poly-Methfol-FA-DHA (VITAFOL ULTRA) 29-0.6-0.4-200 MG CAPS Take 1 capsule by  mouth daily before breakfast. 90 capsule 4  . ARIPiprazole (ABILIFY) 5 MG tablet Take 1 tablet (5 mg total) by mouth daily. 30 tablet 0  . docusate sodium (COLACE) 100 MG capsule Take 1 capsule (100 mg total) by mouth 2 (two) times daily. 60 capsule 11  . hydrOXYzine (ATARAX/VISTARIL) 50 MG tablet Take 1/2 - 1 full tablets QHS prn for insomnia 30 tablet 0  . medroxyPROGESTERone (DEPO-PROVERA) 150 MG/ML injection Inject 1 mL (150 mg total) into the muscle every 3 (three) months. 1 mL 4  . metroNIDAZOLE (FLAGYL) 500 MG tablet TAKE 1 TABLET (500 MG TOTAL) BY MOUTH 2 (TWO) TIMES DAILY FOR 7 DAYS. 14 tablet 0  . omeprazole (PRILOSEC) 40 MG capsule Take 1 capsule (40 mg total) by mouth daily. 30 capsule 11  . polyethylene glycol powder (GLYCOLAX/MIRALAX) 17 GM/SCOOP powder Take 17 g by mouth daily. 3350 g 0  . tiZANidine (ZANAFLEX) 4 MG tablet 1/2 tablet every 8 hours as needed for muscle spasm and may take 1 whole tablet at bedtime if needed 20 tablet 0  . Vitamin D, Ergocalciferol, (DRISDOL) 1.25 MG (50000  UNIT) CAPS capsule TAKE 1 CAPSULE (50,000 UNITS TOTAL) BY MOUTH EVERY 7 (SEVEN) DAYS. 4 capsule 2   No current facility-administered medications for this visit.    Review of Systems Review of Systems Constitutional: negative for fatigue and weight loss Respiratory: negative for cough and wheezing Cardiovascular: negative for chest pain, fatigue and palpitations Gastrointestinal: negative for abdominal pain and change in bowel habits Genitourinary: positive for pelvic pain and vaginal discharge Integument/breast: negative for nipple discharge Musculoskeletal:negative for myalgias Neurological: negative for gait problems and tremors Behavioral/Psych: negative for abusive relationship, depression Endocrine: negative for temperature intolerance      Blood pressure 111/72, pulse 66, weight 166 lb (75.3 kg), last menstrual period 10/22/2020.  Physical Exam Physical Exam General:   alert and no  distress  Skin:   no rash or abnormalities  Lungs:   clear to auscultation bilaterally  Heart:   regular rate and rhythm, S1, S2 normal, no murmur, click, rub or gallop  Breasts:   normal without suspicious masses, skin or nipple changes or axillary nodes  Abdomen:  normal findings: no organomegaly, soft, non-tender and no hernia  Pelvis:  External genitalia: normal general appearance Urinary system: urethral meatus normal and bladder without fullness, nontender Vaginal: normal without tenderness, induration or masses Cervix: normal appearance Adnexa: normal bimanual exam Uterus: anteverted and non-tender, normal size    I have spent a total of 20 minutes of face-to-face time, excluding clinical staff time, reviewing notes and preparing to see patient, ordering tests and/or medications, and counseling the patient.  Data Reviewed Wet Prep  Assessment     1. Combined abdominal and pelvic pain Rx: - US PELVIC COMPLETE WITH TRANSVAGINAL; Future  2. Dysmenorrhea Rx: - ibuprofen (ADVIL) 800 MG tablet; Take 1 tablet (800 mg total) by mouth every 8 (eight) hours as needed.  Dispense: 30 tablet; Refill: 5  3. Vaginal discharge Rx: - Cervicovaginal ancillary only( [place])  4. Constipation, unspecified constipation type Rx: - Ambulatory referral to Gastroenterology - docusate sodium (COLACE) 100 MG capsule; Take 1 capsule (100 mg total) by mouth 2 (two) times daily.  Dispense: 60 capsule; Refill: 11  5. Gastroesophageal reflux disease without esophagitis Rx: - omeprazole (PRILOSEC) 40 MG capsule; Take 1 capsule (40 mg total) by mouth daily.  Dispense: 30 capsule; Refill: 11  6. Encounter for counseling regarding contraception - wants Depo Provera  7. Encounter for initial prescription of injectable contraceptive Rx: - medroxyPROGESTERone (DEPO-PROVERA) 150 MG/ML injection; Inject 1 mL (150 mg total) into the muscle every 3 (three) months.  Dispense: 1 mL; Refill: 4  8.  Vitamin deficiency Rx: - Prenat-Fe Poly-Methfol-FA-DHA (VITAFOL ULTRA) 29-0.6-0.4-200 MG CAPS; Take 1 capsule by mouth daily before breakfast.  Dispense: 90 capsule; Refill: 4    Plan  Follow up in 2 weeks   Orders Placed This Encounter  Procedures  . US PELVIC COMPLETE WITH TRANSVAGINAL    Standing Status:   Future    Standing Expiration Date:   11/06/2021    Order Specific Question:   Reason for Exam (SYMPTOM  OR DIAGNOSIS REQUIRED)    Answer:   Pelvic pain    Order Specific Question:   Preferred imaging location?    Answer:   [facility]  . Ambulatory referral to Gastroenterology    Referral Priority:   Routine    Referral Type:   Consultation    Referral Reason:   Specialty Services Required    Number of Visits Requested:   1  Meds ordered this encounter  Medications  . DISCONTD: docusate sodium (COLACE) 100 MG capsule    Sig: Take 1 capsule (100 mg total) by mouth 2 (two) times daily.    Dispense:  60 capsule    Refill:  11  . DISCONTD: omeprazole (PRILOSEC) 40 MG capsule    Sig: Take 1 capsule (40 mg total) by mouth daily.    Dispense:  30 capsule    Refill:  11  . DISCONTD: medroxyPROGESTERone (DEPO-PROVERA) 150 MG/ML injection    Sig: Inject 1 mL (150 mg total) into the muscle every 3 (three) months.    Dispense:  1 mL    Refill:  4  . ibuprofen (ADVIL) 800 MG tablet    Sig: Take 1 tablet (800 mg total) by mouth every 8 (eight) hours as needed.    Dispense:  30 tablet    Refill:  5  . Prenat-Fe Poly-Methfol-FA-DHA (VITAFOL ULTRA) 29-0.6-0.4-200 MG CAPS    Sig: Take 1 capsule by mouth daily before breakfast.    Dispense:  90 capsule    Refill:  4  . docusate sodium (COLACE) 100 MG capsule    Sig: Take 1 capsule (100 mg total) by mouth 2 (two) times daily.    Dispense:  60 capsule    Refill:  11  . omeprazole (PRILOSEC) 40 MG capsule    Sig: Take 1 capsule (40 mg total) by mouth daily.    Dispense:  30 capsule    Refill:  11  . medroxyPROGESTERone  (DEPO-PROVERA) 150 MG/ML injection    Sig: Inject 1 mL (150 mg total) into the muscle every 3 (three) months.    Dispense:  1 mL    Refill:  4     Brock Bad, MD 11/06/2020 9:46 AM

## 2020-11-07 LAB — CERVICOVAGINAL ANCILLARY ONLY
Bacterial Vaginitis (gardnerella): POSITIVE — AB
Candida Glabrata: NEGATIVE
Candida Vaginitis: NEGATIVE
Chlamydia: NEGATIVE
Comment: NEGATIVE
Comment: NEGATIVE
Comment: NEGATIVE
Comment: NEGATIVE
Comment: NEGATIVE
Comment: NORMAL
Neisseria Gonorrhea: NEGATIVE
Trichomonas: NEGATIVE

## 2020-11-10 ENCOUNTER — Other Ambulatory Visit: Payer: Self-pay | Admitting: Obstetrics

## 2020-11-10 DIAGNOSIS — B9689 Other specified bacterial agents as the cause of diseases classified elsewhere: Secondary | ICD-10-CM

## 2020-11-10 DIAGNOSIS — N760 Acute vaginitis: Secondary | ICD-10-CM

## 2020-11-10 MED ORDER — METRONIDAZOLE 0.75 % VA GEL: 1.0000 | Freq: Two times a day (BID) | VAGINAL | 5 refills | Status: DC

## 2020-11-10 MED ORDER — METRONIDAZOLE 500 MG PO TABS: ORAL_TABLET | Freq: Two times a day (BID) | ORAL | 0 refills | Status: DC

## 2020-11-13 ENCOUNTER — Other Ambulatory Visit: Payer: Self-pay

## 2020-11-13 ENCOUNTER — Ambulatory Visit (HOSPITAL_BASED_OUTPATIENT_CLINIC_OR_DEPARTMENT_OTHER)
Admission: RE | Admit: 2020-11-13 | Discharge: 2020-11-13 | Disposition: A | Discharge status: Home / Self Care | Payer: Medicaid Other | Source: Ambulatory Visit | Attending: Obstetrics | Admitting: Obstetrics

## 2020-11-13 DIAGNOSIS — R109 Unspecified abdominal pain: Secondary | ICD-10-CM | POA: Diagnosis present

## 2020-11-13 DIAGNOSIS — R102 Pelvic and perineal pain: Secondary | ICD-10-CM | POA: Diagnosis present

## 2020-11-20 ENCOUNTER — Ambulatory Visit: Payer: Medicaid Other | Admitting: Obstetrics

## 2020-11-25 ENCOUNTER — Ambulatory Visit: Payer: Medicaid Other | Admitting: Obstetrics

## 2021-04-07 ENCOUNTER — Ambulatory Visit
Admission: EM | Admit: 2021-04-07 | Discharge: 2021-04-07 | Disposition: A | Discharge status: Home / Self Care | Payer: Medicaid Other | Attending: Physician Assistant | Admitting: Physician Assistant

## 2021-04-07 ENCOUNTER — Other Ambulatory Visit: Payer: Self-pay

## 2021-04-07 DIAGNOSIS — N898 Other specified noninflammatory disorders of vagina: Secondary | ICD-10-CM | POA: Diagnosis not present

## 2021-04-07 LAB — POCT URINE PREGNANCY: Preg Test, Ur: NEGATIVE

## 2021-04-07 NOTE — ED Provider Notes (Signed)
[facility]    CSN: [id number] Arrival date & time: 04/07/21  1701      History   Chief Complaint Chief Complaint  Patient presents with   Vaginal Pain   Nausea    HPI Ann Clark is a 35 y.o. female.   Patient here today for evaluation of vaginal pain that has been off and on over the last few months.  She reports that she will have what feels like spasms vaginally.  She has off-and-on discharge as well.  She is not aware of any STD exposure, but she does state that she has unprotected sex with a man who does have multiple partners.  She has not had any fever or chills.  She does not report any treatment for symptoms.    The history is provided by the patient.  Vaginal Pain Pertinent negatives include no shortness of breath.   Past Medical History:  Diagnosis Date   Anemia    Anxiety    Preterm labor    Shoulder pain    Trichomonas infection 08/01/2015    Patient Active Problem List   Diagnosis Date Noted   Insomnia 06/25/2020   Acute cystitis without hematuria 06/11/2020   Bacterial vaginitis 06/11/2020   Anxiety state 06/11/2020   H. pylori infection 02/27/2020   Vitamin D deficiency 02/27/2020   Myalgia and myositis 07/30/2015   GERD (gastroesophageal reflux disease) 07/30/2015   Arthralgia 07/16/2015   Bipolar I disorder (HCC) 07/16/2015    Past Surgical History:  Procedure Laterality Date   EUSTACHIAN TUBE DILATION     TONSILLECTOMY      OB History     Gravida  2   Para  1   Term      Preterm  1   AB      Living  1      SAB      IAB      Ectopic      Multiple      Live Births  1            Home Medications    Prior to Admission medications   Medication Sig Start Date End Date Taking? Authorizing Provider  ARIPiprazole (ABILIFY) 5 MG tablet Take 1 tablet (5 mg total) by mouth daily. 07/22/20   Rema Fendt, NP  docusate sodium (COLACE) 100 MG capsule Take 1 capsule (100 mg total) by mouth 2 (two) times  daily. 11/06/20   Brock Bad, MD  hydrOXYzine (ATARAX/VISTARIL) 50 MG tablet Take 1/2 - 1 full tablets QHS prn for insomnia 07/22/20   Rema Fendt, NP  ibuprofen (ADVIL) 800 MG tablet Take 1 tablet (800 mg total) by mouth every 8 (eight) hours as needed. 11/06/20   Brock Bad, MD  medroxyPROGESTERone (DEPO-PROVERA) 150 MG/ML injection Inject 1 mL (150 mg total) into the muscle every 3 (three) months. 11/06/20   Brock Bad, MD  metroNIDAZOLE (METROGEL VAGINAL) 0.75 % vaginal gel Place 1 Applicatorful vaginally 2 (two) times daily. 11/10/20   Brock Bad, MD  omeprazole (PRILOSEC) 40 MG capsule Take 1 capsule (40 mg total) by mouth daily. 11/06/20   Brock Bad, MD  polyethylene glycol powder (GLYCOLAX/MIRALAX) 17 GM/SCOOP powder Take 17 g by mouth daily. 07/22/20   Rema Fendt, NP  Prenat-Fe Poly-Methfol-FA-DHA (VITAFOL ULTRA) 29-0.6-0.4-200 MG CAPS Take 1 capsule by mouth daily before breakfast. 11/06/20   Brock Bad, MD  tiZANidine (ZANAFLEX) 4 MG tablet 1/2  tablet every 8 hours as needed for muscle spasm and may take 1 whole tablet at bedtime if needed 02/20/20   Mayers, Cari S, PA-C  Vitamin D, Ergocalciferol, (DRISDOL) 1.25 MG (50000 UNIT) CAPS capsule TAKE 1 CAPSULE (50,000 UNITS TOTAL) BY MOUTH EVERY 7 (SEVEN) DAYS. 06/10/20 06/10/21  Mayers, Kasandra Knudsen, PA-C    Family History Family History  Problem Relation Age of Onset   COPD Mother    Diabetes Mother    Hyperlipidemia Mother    Hypertension Mother    Anxiety disorder Mother    Arthritis Mother    Asthma Mother    Depression Mother    Drug abuse Mother    Vision loss Mother    Kidney disease Father    Drug abuse Father    Heart Problems Father    ADD / ADHD Sister    Heart disease Sister    Hearing loss Son     Social History Social History   Tobacco Use   Smoking status: Light Smoker    Packs/day: 0.25    Types: Cigarettes   Smokeless tobacco: Never   Tobacco comments:     occasional  Vaping Use   Vaping Use: Never used  Substance Use Topics   Alcohol use: Yes    Alcohol/week: 2.0 - 3.0 standard drinks    Types: 2 - 3 Shots of liquor per week    Comment: two days weekly   Drug use: Not Currently    Types: Marijuana    Comment: last use approx 12/2020     Allergies   Patient has no known allergies.   Review of Systems Review of Systems  Constitutional:  Negative for chills and fever.  Eyes:  Negative for discharge and redness.  Respiratory:  Negative for shortness of breath.   Gastrointestinal:  Positive for nausea. Negative for vomiting.  Genitourinary:  Positive for vaginal discharge and vaginal pain.    Physical Exam Triage Vital Signs ED Triage Vitals  Enc Vitals Group     BP 04/07/21 1753 111/73     Pulse Rate 04/07/21 1753 70     Resp 04/07/21 1753 18     Temp 04/07/21 1753 98.3 F (36.8 C)     Temp Source 04/07/21 1753 Oral     SpO2 04/07/21 1753 99 %     Weight --      Height --      Head Circumference --      Peak Flow --      Pain Score 04/07/21 1756 6     Pain Loc --      Pain Edu? --      Excl. in [place]? --    No data found.  Updated Vital Signs BP 111/73 (BP Location: Left Arm)   Pulse 70   Temp 98.3 F (36.8 C) (Oral)   Resp 18   LMP 03/17/2021 (Approximate)   SpO2 99%      Physical Exam Vitals and nursing note reviewed.  Constitutional:      General: She is not in acute distress.    Appearance: Normal appearance. She is not ill-appearing.  HENT:     Head: Normocephalic and atraumatic.  Cardiovascular:     Rate and Rhythm: Normal rate.  Pulmonary:     Effort: Pulmonary effort is normal.  Neurological:     Mental Status: She is alert.  Psychiatric:        Mood and Affect: Mood normal.  Behavior: Behavior normal.     UC Treatments / Results  Labs (all labs ordered are listed, but only abnormal results are displayed) Labs Reviewed  POCT URINE PREGNANCY  CERVICOVAGINAL ANCILLARY ONLY     EKG   Radiology No results found.  Procedures Procedures (including critical care time)  Medications Ordered in UC Medications - No data to display  Initial Impression / Assessment and Plan / UC Course  I have reviewed the triage vital signs and the nursing notes.  Pertinent labs & imaging results that were available during my care of the patient were reviewed by me and considered in my medical decision making (see chart for details).  Unknown cause of symptoms but will screen for STDs.  Urine pregnancy negative in office.  Recommended if she misses a period to repeat pregnancy test.  Encouraged follow-up with any further concerns.  Final Clinical Impressions(s) / UC Diagnoses   Final diagnoses:  None   Discharge Instructions   None    ED Prescriptions   None    PDMP not reviewed this encounter.   Tomi Bamberger, PA-C 04/07/21 1851

## 2021-04-07 NOTE — ED Triage Notes (Signed)
Two month h/o "sharp"  vaginal pain that's waxing and waning. Pt reports that the sxs occur about once a week. One week of nausea and intermittent HA. Negative at home covid test. Denies v/d. Laying down alleviates sxs.

## 2021-04-10 ENCOUNTER — Telehealth (HOSPITAL_COMMUNITY): Payer: Self-pay | Admitting: Emergency Medicine

## 2021-04-10 LAB — CERVICOVAGINAL ANCILLARY ONLY
Bacterial Vaginitis (gardnerella): POSITIVE — AB
Candida Glabrata: NEGATIVE
Candida Vaginitis: NEGATIVE
Chlamydia: NEGATIVE
Comment: NEGATIVE
Comment: NEGATIVE
Comment: NEGATIVE
Comment: NEGATIVE
Comment: NEGATIVE
Comment: NORMAL
Neisseria Gonorrhea: NEGATIVE
Trichomonas: NEGATIVE

## 2021-04-10 MED ORDER — METRONIDAZOLE 0.75 % VA GEL: 1.0000 | Freq: Every day | VAGINAL | 0 refills | Status: AC

## 2021-07-10 ENCOUNTER — Other Ambulatory Visit: Payer: Self-pay

## 2021-07-10 ENCOUNTER — Encounter: Payer: Self-pay | Admitting: Emergency Medicine

## 2021-07-10 ENCOUNTER — Ambulatory Visit
Admission: EM | Admit: 2021-07-10 | Discharge: 2021-07-10 | Disposition: A | Discharge status: Other Acute Inpatient Hospital | Payer: Medicaid Other

## 2021-07-10 ENCOUNTER — Ambulatory Visit
Admission: EM | Admit: 2021-07-10 | Discharge: 2021-07-10 | Disposition: A | Discharge status: Home / Self Care | Payer: Medicaid Other | Attending: Emergency Medicine | Admitting: Emergency Medicine

## 2021-07-10 DIAGNOSIS — G8929 Other chronic pain: Secondary | ICD-10-CM

## 2021-07-10 DIAGNOSIS — M25551 Pain in right hip: Secondary | ICD-10-CM | POA: Diagnosis not present

## 2021-07-10 DIAGNOSIS — M25571 Pain in right ankle and joints of right foot: Secondary | ICD-10-CM | POA: Diagnosis not present

## 2021-07-10 MED ORDER — DICLOFENAC SODIUM 1 % EX GEL: 6.0000 g | Freq: Four times a day (QID) | CUTANEOUS | 0 refills | Status: AC

## 2021-07-10 MED ORDER — DICLOFENAC SODIUM 1 % EX GEL: 6.0000 g | Freq: Four times a day (QID) | CUTANEOUS | 0 refills | Status: DC

## 2021-07-10 NOTE — ED Provider Notes (Addendum)
[facility]    CSN: [id number] Arrival date & time: 07/10/21  1526    HISTORY   Chief Complaint  Patient presents with   Ankle Pain   HPI Ann Clark is a 36 y.o. female. Patient presents to [facility] for evaluation of right ankle pain and occasional right hip pain x 1 year.  Patient denies no known acute injury to either joint.  Patient states she has a primary care provider appointment coming up next month.  Patient states she came in to see if there is anything she could do sooner.  Patient states she is currently taking ibuprofen as needed.  The history is provided by the patient.  Past Medical History:  Diagnosis Date   Anemia    Anxiety    Preterm labor    Shoulder pain    Trichomonas infection 08/01/2015   Patient Active Problem List   Diagnosis Date Noted   Insomnia 06/25/2020   Acute cystitis without hematuria 06/11/2020   Bacterial vaginitis 06/11/2020   Anxiety state 06/11/2020   H. pylori infection 02/27/2020   Vitamin D deficiency 02/27/2020   Myalgia and myositis 07/30/2015   GERD (gastroesophageal reflux disease) 07/30/2015   Arthralgia 07/16/2015   Bipolar I disorder ([place]) 07/16/2015   Past Surgical History:  Procedure Laterality Date   EUSTACHIAN TUBE DILATION     TONSILLECTOMY     OB History     Gravida  2   Para  1   Term      Preterm  1   AB      Living  1      SAB      IAB      Ectopic      Multiple      Live Births  1          Home Medications    Prior to Admission medications   Medication Sig Start Date End Date Taking? Authorizing Provider  omeprazole (PRILOSEC) 40 MG capsule Take 1 capsule (40 mg total) by mouth daily. 11/06/20  Yes Shelly Bombard, MD  ibuprofen (ADVIL) 800 MG tablet Take 1 tablet (800 mg total) by mouth every 8 (eight) hours as needed. 11/06/20   Shelly Bombard, MD    Family History Family History  Problem Relation Age of Onset   COPD Mother    Diabetes Mother    Hyperlipidemia  Mother    Hypertension Mother    Anxiety disorder Mother    Arthritis Mother    Asthma Mother    Depression Mother    Drug abuse Mother    Vision loss Mother    Kidney disease Father    Drug abuse Father    Heart Problems Father    ADD / ADHD Sister    Heart disease Sister    Hearing loss Son    Social History Social History   Tobacco Use   Smoking status: Light Smoker    Packs/day: 0.25    Types: Cigarettes   Smokeless tobacco: Never   Tobacco comments:    occasional  Vaping Use   Vaping Use: Never used  Substance Use Topics   Alcohol use: Yes    Alcohol/week: 2.0 - 3.0 standard drinks    Types: 2 - 3 Shots of liquor per week    Comment: two days weekly   Drug use: Not Currently    Types: Marijuana    Comment: last use approx 12/2020   Allergies   Patient  has no known allergies.  Review of Systems Review of Systems Pertinent findings noted in history of present illness.   Physical Exam Triage Vital Signs ED Triage Vitals  Enc Vitals Group     BP 04/24/21 0827 (!) 147/82     Pulse Rate 04/24/21 0827 72     Resp 04/24/21 0827 18     Temp 04/24/21 0827 98.3 F (36.8 C)     Temp Source 04/24/21 0827 Oral     SpO2 04/24/21 0827 98 %     Weight --      Height --      Head Circumference --      Peak Flow --      Pain Score 04/24/21 0826 5     Pain Loc --      Pain Edu? --      Excl. in [place]? --   No data found.  Updated Vital Signs Pulse 68    Temp 98 F (36.7 C) (Oral)    Resp 18    LMP 06/16/2021    SpO2 98%   Physical Exam Vitals and nursing note reviewed.  Constitutional:      General: She is not in acute distress.    Appearance: Normal appearance. She is not ill-appearing.  HENT:     Head: Normocephalic and atraumatic.  Eyes:     General: Lids are normal.        Right eye: No discharge.        Left eye: No discharge.     Extraocular Movements: Extraocular movements intact.     Conjunctiva/sclera: Conjunctivae normal.     Right eye: Right  conjunctiva is not injected.     Left eye: Left conjunctiva is not injected.  Neck:     Trachea: Trachea and phonation normal.  Cardiovascular:     Rate and Rhythm: Normal rate and regular rhythm.     Pulses: Normal pulses.     Heart sounds: Normal heart sounds. No murmur heard.   No friction rub. No gallop.  Pulmonary:     Effort: Pulmonary effort is normal. No accessory muscle usage, prolonged expiration or respiratory distress.     Breath sounds: Normal breath sounds. No stridor, decreased air movement or transmitted upper airway sounds. No decreased breath sounds, wheezing, rhonchi or rales.  Chest:     Chest wall: No tenderness.  Musculoskeletal:        General: Normal range of motion.     Cervical back: Normal range of motion and neck supple. Normal range of motion.  Lymphadenopathy:     Cervical: No cervical adenopathy.  Skin:    General: Skin is warm and dry.     Findings: No erythema or rash.  Neurological:     General: No focal deficit present.     Mental Status: She is alert and oriented to person, place, and time.  Psychiatric:        Mood and Affect: Mood normal.        Behavior: Behavior normal.    Visual Acuity Right Eye Distance:   Left Eye Distance:   Bilateral Distance:    Right Eye Near:   Left Eye Near:    Bilateral Near:     UC Couse / Diagnostics / Procedures:    EKG  Radiology No results found.  Procedures Procedures (including critical care time)  UC Diagnoses / Final Clinical Impressions(s)   I have reviewed the triage vital signs and the nursing notes.  Pertinent labs & imaging results that were available during my care of the patient were reviewed by me and considered in my medical decision making (see chart for details).    Final diagnoses:  Chronic pain of right ankle  Chronic right hip pain   Physical exam is unremarkable, patient reports no reproducible pain at this time.  Given chronicity of symptoms, recommend she follow-up  with primary care.  Voltaren gel provided for pain relief.  ED Prescriptions     Medication Sig Dispense Auth. Provider   diclofenac Sodium (VOLTAREN) 1 % GEL Apply 6 g topically 4 (four) times daily for 15 days. 350 g Lynden Oxford Scales, PA-C      PDMP not reviewed this encounter.  Pending results:  Labs Reviewed - No data to display  Medications Ordered in UC: Medications - No data to display  Disposition Upon Discharge:  Condition: stable for discharge home Home: take medications as prescribed; routine discharge instructions as discussed; follow up as advised.  Patient presented with an acute illness with associated systemic symptoms and significant discomfort requiring urgent management. In my opinion, this is a condition that a prudent lay person (someone who possesses an average knowledge of health and medicine) may potentially expect to result in complications if not addressed urgently such as respiratory distress, impairment of bodily function or dysfunction of bodily organs.   Routine symptom specific, illness specific and/or disease specific instructions were discussed with the patient and/or caregiver at length.   As such, the patient has been evaluated and assessed, work-up was performed and treatment was provided in alignment with urgent care protocols and evidence based medicine.  Patient/parent/caregiver has been advised that the patient may require follow up for further testing and treatment if the symptoms continue in spite of treatment, as clinically indicated and appropriate.  If the patient was tested for COVID-19, Influenza and/or RSV, then the patient/parent/guardian was advised to isolate at home pending the results of his/her diagnostic coronavirus test and potentially longer if theyre positive. I have also advised pt that if his/her COVID-19 test returns positive, it's recommended to self-isolate for at least 10 days after symptoms first appeared AND until  fever-free for 24 hours without fever reducer AND other symptoms have improved or resolved. Discussed self-isolation recommendations as well as instructions for household member/close contacts as per the [facility] and [place] DHHS, and also gave patient the [facility] packet with this information.  Patient/parent/caregiver has been advised to return to the [facility] or PCP in 3-5 days if no better; to PCP or the Emergency Department if new signs and symptoms develop, or if the current signs or symptoms continue to change or worsen for further workup, evaluation and treatment as clinically indicated and appropriate  The patient will follow up with their current PCP if and as advised. If the patient does not currently have a PCP we will assist them in obtaining one.   The patient may need specialty follow up if the symptoms continue, in spite of conservative treatment and management, for further workup, evaluation, consultation and treatment as clinically indicated and appropriate.   Patient/parent/caregiver verbalized understanding and agreement of plan as discussed.  All questions were addressed during visit.  Please see discharge instructions below for further details of plan.  Discharge Instructions:   Discharge Instructions      Begin Voltaren gel as needed for right ankle and hip pain.  Please follow-up with your primary care provider for further evaluation of these issues.  This office note has been dictated using Museum/gallery curator.  Unfortunately, and despite my best efforts, this method of dictation can sometimes lead to occasional typographical or grammatical errors.  I apologize in advance if this occurs.     Lynden Oxford Scales, PA-C 07/10/21 1609    Lynden Oxford Scales, PA-C 07/10/21 1610

## 2021-07-10 NOTE — Discharge Instructions (Addendum)
Begin Voltaren gel as needed for right ankle and hip pain.  Please follow-up with your primary care provider for further evaluation of these issues.

## 2021-07-10 NOTE — ED Triage Notes (Signed)
Patient presents to [facility] for evaluation of right ankle pain and occasional right hip pain x 1 year.

## 2021-07-31 NOTE — Progress Notes (Signed)
Patient ID: JOUMANA BEICHNER, female    DOB: Dec 18, 1985  MRN: [id number]  CC: Urgent Care Follow-Up Subjective: Ann Clark is a 36 y.o. female who presents for urgent care follow-up.   Her concerns today include:   Patient presented today for annual physical exam. However, she is a few days early and would rather reschedule to ensure health insurance covers.   URGENT CARE FOLLOW-UP: 07/10/2021 at [facility] [place] per PA note: Begin Voltaren gel as needed for right ankle and hip pain.  Please follow-up with your primary care provider for further evaluation of these issues.  08/07/2021: Still having right ankle pain. Located primarily medial aspect of right foot. Denies trauma/injury, swelling, and additional red flag symptoms. Able walk as normal. Wearing an ace bandage to help. Thinks overtime the way she sleeps (with leg and foot tucked at an angle) may be the cause. Voltaren gel helps, requesting refill.   Patient Active Problem List   Diagnosis Date Noted   Insomnia 06/25/2020   Acute cystitis without hematuria 06/11/2020   Bacterial vaginitis 06/11/2020   Anxiety state 06/11/2020   H. pylori infection 02/27/2020   Vitamin D deficiency 02/27/2020   Myalgia and myositis 07/30/2015   GERD (gastroesophageal reflux disease) 07/30/2015   Arthralgia 07/16/2015   Bipolar I disorder ([place]) 07/16/2015     Current Outpatient Medications on File Prior to Visit  Medication Sig Dispense Refill   ibuprofen (ADVIL) 800 MG tablet Take 1 tablet (800 mg total) by mouth every 8 (eight) hours as needed. 30 tablet 5   omeprazole (PRILOSEC) 40 MG capsule Take 1 capsule (40 mg total) by mouth daily. 30 capsule 11   No current facility-administered medications on file prior to visit.    No Known Allergies  Social History   Socioeconomic History   Marital status: Single    Spouse name: Not on file   Number of children: Not on file   Years of education: Not on  file   Highest education level: Not on file  Occupational History   Not on file  Tobacco Use   Smoking status: Light Smoker    Packs/day: 0.25    Types: Cigarettes   Smokeless tobacco: Never   Tobacco comments:    occasional  Vaping Use   Vaping Use: Never used  Substance and Sexual Activity   Alcohol use: Yes    Alcohol/week: 2.0 - 3.0 standard drinks    Types: 2 - 3 Shots of liquor per week    Comment: two days weekly   Drug use: Not Currently    Types: Marijuana    Comment: last use approx 12/2020   Sexual activity: Yes  Other Topics Concern   Not on file  Social History Narrative   Not on file   Social Determinants of Health   Financial Resource Strain: Not on file  Food Insecurity: Not on file  Transportation Needs: Not on file  Physical Activity: Not on file  Stress: Not on file  Social Connections: Not on file  Intimate Partner Violence: Not on file    Family History  Problem Relation Age of Onset   COPD Mother    Diabetes Mother    Hyperlipidemia Mother    Hypertension Mother    Anxiety disorder Mother    Arthritis Mother    Asthma Mother    Depression Mother    Drug abuse Mother    Vision loss Mother    Kidney disease  Father    Drug abuse Father    Heart Problems Father    ADD / ADHD Sister    Heart disease Sister    Hearing loss Son     Past Surgical History:  Procedure Laterality Date   EUSTACHIAN TUBE DILATION     TONSILLECTOMY      ROS: Review of Systems Negative except as stated above  PHYSICAL EXAM: BP 124/81 (BP Location: Left Arm, Patient Position: Sitting, Cuff Size: Normal)    Pulse 79    Temp 98.5 F (36.9 C)    Resp 18    Wt 169 lb 6.4 oz (76.8 kg)    SpO2 98%    BMI 31.94 kg/m   Physical Exam HENT:     Head: Normocephalic and atraumatic.  Eyes:     Extraocular Movements: Extraocular movements intact.     Conjunctiva/sclera: Conjunctivae normal.     Pupils: Pupils are equal, round, and reactive to light.   Cardiovascular:     Rate and Rhythm: Normal rate and regular rhythm.     Pulses: Normal pulses.     Heart sounds: Normal heart sounds.  Pulmonary:     Effort: Pulmonary effort is normal.     Breath sounds: Normal breath sounds.  Musculoskeletal:     Cervical back: Normal range of motion and neck supple.     Right foot: Tenderness present.  Neurological:     General: No focal deficit present.     Mental Status: She is alert and oriented to person, place, and time.  Psychiatric:        Mood and Affect: Mood normal.        Behavior: Behavior normal.    ASSESSMENT AND PLAN: 1. Chronic pain of right ankle: - Continue Diclofenac Sodium gel as prescribed.  - Diagnostic x-ray right foot for further evaluation.  - Follow-up with primary provider as scheduled. - diclofenac Sodium (VOLTAREN) 1 % GEL; Apply 4 g topically 4 (four) times daily.  Dispense: 350 g; Refill: 0 - DG Foot Complete Right; Future    Patient was given the opportunity to ask questions.  Patient verbalized understanding of the plan and was able to repeat key elements of the plan. Patient was given clear instructions to go to Emergency Department or return to medical center if symptoms don't improve, worsen, or new problems develop.The patient verbalized understanding.   Requested Prescriptions   Signed Prescriptions Disp Refills   diclofenac Sodium (VOLTAREN) 1 % GEL 350 g 0    Sig: Apply 4 g topically 4 (four) times daily.    Follow-up with primary provider as scheduled.  [name] Herter, NP

## 2021-08-07 ENCOUNTER — Encounter: Payer: Self-pay | Admitting: Family

## 2021-08-07 ENCOUNTER — Other Ambulatory Visit: Payer: Self-pay

## 2021-08-07 ENCOUNTER — Ambulatory Visit (INDEPENDENT_AMBULATORY_CARE_PROVIDER_SITE_OTHER): Payer: Medicaid Other | Admitting: Family

## 2021-08-07 VITALS — BP 124/81 | HR 79 | Temp 98.5°F | Resp 18 | Wt 169.4 lb

## 2021-08-07 DIAGNOSIS — Z131 Encounter for screening for diabetes mellitus: Secondary | ICD-10-CM

## 2021-08-07 DIAGNOSIS — M25571 Pain in right ankle and joints of right foot: Secondary | ICD-10-CM | POA: Diagnosis not present

## 2021-08-07 DIAGNOSIS — G8929 Other chronic pain: Secondary | ICD-10-CM | POA: Diagnosis not present

## 2021-08-07 DIAGNOSIS — Z13220 Encounter for screening for lipoid disorders: Secondary | ICD-10-CM

## 2021-08-07 DIAGNOSIS — Z0000 Encounter for general adult medical examination without abnormal findings: Secondary | ICD-10-CM

## 2021-08-07 DIAGNOSIS — Z1329 Encounter for screening for other suspected endocrine disorder: Secondary | ICD-10-CM

## 2021-08-07 DIAGNOSIS — Z13228 Encounter for screening for other metabolic disorders: Secondary | ICD-10-CM

## 2021-08-07 DIAGNOSIS — Z130 Encounter for screening for diseases of the blood and blood-forming organs and certain disorders involving the immune mechanism: Secondary | ICD-10-CM

## 2021-08-07 MED ORDER — DICLOFENAC SODIUM 1 % EX GEL: 4.0000 g | Freq: Four times a day (QID) | CUTANEOUS | 0 refills | Status: DC

## 2021-08-07 NOTE — Progress Notes (Signed)
Pt presents for right ankle pain going on for about 3 days

## 2021-08-15 ENCOUNTER — Telehealth: Payer: Self-pay | Admitting: Family

## 2021-08-15 NOTE — Telephone Encounter (Signed)
Patient left vm on 2/16 to get rescheduled for a physical. First opening was 4/4 but per patient that would not work. Requested that Eboney give her a call back.

## 2021-08-28 NOTE — Progress Notes (Signed)
Patient ID: SANDIA PFUND, female    DOB: 1986/02/29  MRN: [id number]  CC: Annual Physical Exam  Subjective: Ann Clark is a 36 y.o. female who presents for annual physical exam.   Her concerns today include:   CHRONIC PAIN RIGHT ANKLE FOLLOW-UP: 08/07/2021: - Continue Diclofenac Sodium gel as prescribed.  - Diagnostic x-ray right foot for further evaluation.  - Follow-up with primary provider as scheduled.  09/04/2021: Right ankle pain with numbness persisting. Intermittent in nature but happens daily. Worse in the morning usually having to hold foot at angle for relief enough to walk. Ace bandage makes worse if ankle already hurting. However, ace bandage helps before ankle starts hurting. Physical activity/exercise provides relief. Ibuprofen, Tylenol Arthritis Strength, and Diclofenac gel helps. Also, has noticed bilateral hand pain with intermittent locking up and bilateral leg pain. Denies shortness of breath, chest pain, tenderness, redness, warmth and additional red flag symptoms.   2. ACID REFLUX FOLLOW-UP: Doing well on current regimen, no issues or concerns. Requesting refills.   Patient Active Problem List   Diagnosis Date Noted   Insomnia 06/25/2020   Acute cystitis without hematuria 06/11/2020   Bacterial vaginitis 06/11/2020   Anxiety state 06/11/2020   H. pylori infection 02/27/2020   Vitamin D deficiency 02/27/2020   Myalgia and myositis 07/30/2015   GERD (gastroesophageal reflux disease) 07/30/2015   Arthralgia 07/16/2015   Bipolar I disorder ([place]) 07/16/2015     Current Outpatient Medications on File Prior to Visit  Medication Sig Dispense Refill   diclofenac Sodium (VOLTAREN) 1 % GEL Apply 4 g topically 4 (four) times daily. 350 g 0   ibuprofen (ADVIL) 800 MG tablet Take 1 tablet (800 mg total) by mouth every 8 (eight) hours as needed. 30 tablet 5   No current facility-administered medications on file prior to visit.    No Known Allergies  Social  History   Socioeconomic History   Marital status: Single    Spouse name: Not on file   Number of children: Not on file   Years of education: Not on file   Highest education level: Not on file  Occupational History   Not on file  Tobacco Use   Smoking status: Light Smoker    Packs/day: 0.25    Types: Cigarettes    Passive exposure: Current   Smokeless tobacco: Never   Tobacco comments:    occasional  Vaping Use   Vaping Use: Never used  Substance and Sexual Activity   Alcohol use: Yes    Alcohol/week: 2.0 - 3.0 standard drinks    Types: 2 - 3 Shots of liquor per week    Comment: two days weekly   Drug use: Not Currently    Types: Marijuana    Comment: last use approx 12/2020   Sexual activity: Yes  Other Topics Concern   Not on file  Social History Narrative   Not on file   Social Determinants of Health   Financial Resource Strain: Not on file  Food Insecurity: Not on file  Transportation Needs: Not on file  Physical Activity: Not on file  Stress: Not on file  Social Connections: Not on file  Intimate Partner Violence: Not on file    Family History  Problem Relation Age of Onset   COPD Mother    Diabetes Mother    Hyperlipidemia Mother    Hypertension Mother    Anxiety disorder Mother    Arthritis Mother    Asthma Mother  Depression Mother    Drug abuse Mother    Vision loss Mother    Kidney disease Father    Drug abuse Father    Heart Problems Father    ADD / ADHD Sister    Heart disease Sister    Hearing loss Son     Past Surgical History:  Procedure Laterality Date   EUSTACHIAN TUBE DILATION     TONSILLECTOMY      ROS: Review of Systems Negative except as stated above  PHYSICAL EXAM: BP 106/72 (BP Location: Left Arm, Patient Position: Sitting, Cuff Size: Normal)    Pulse 70    Temp 98.5 F (36.9 C)    Resp 18    Ht 5' 1.06" (1.551 m)    Wt 165 lb (74.8 kg)    SpO2 98%    BMI 31.11 kg/m   Physical Exam HENT:     Head: Normocephalic  and atraumatic.     Right Ear: Tympanic membrane, ear canal and external ear normal.     Left Ear: Tympanic membrane, ear canal and external ear normal.  Eyes:     Extraocular Movements: Extraocular movements intact.     Conjunctiva/sclera: Conjunctivae normal.     Pupils: Pupils are equal, round, and reactive to light.  Cardiovascular:     Rate and Rhythm: Normal rate and regular rhythm.     Pulses: Normal pulses.     Heart sounds: Normal heart sounds.  Pulmonary:     Effort: Pulmonary effort is normal.     Breath sounds: Normal breath sounds.  Chest:  Breasts:    Right: Normal.     Left: Normal.     Comments: Elmon Else, CMA present during exam. Abdominal:     General: Bowel sounds are normal.     Palpations: Abdomen is soft.  Genitourinary:    Comments: Patient declined exam. Musculoskeletal:        General: Normal range of motion.     Cervical back: Normal range of motion and neck supple.  Skin:    General: Skin is warm and dry.     Capillary Refill: Capillary refill takes less than 2 seconds.  Neurological:     General: No focal deficit present.     Mental Status: She is alert and oriented to person, place, and time.  Psychiatric:        Mood and Affect: Mood normal.        Behavior: Behavior normal.   Results for orders placed or performed in visit on 09/04/21  POCT URINALYSIS DIP (CLINITEK)  Result Value Ref Range   Color, UA yellow yellow   Clarity, UA clear clear   Glucose, UA negative negative mg/dL   Bilirubin, UA negative negative   Ketones, POC UA negative negative mg/dL   Spec Grav, UA 1.020 1.010 - 1.025   Blood, UA trace-intact (A) negative   pH, UA 7.0 5.0 - 8.0   POC PROTEIN,UA negative negative, trace   Urobilinogen, UA 0.2 0.2 or 1.0 E.U./dL   Nitrite, UA Negative Negative   Leukocytes, UA Negative Negative     ASSESSMENT AND PLAN: 1. Annual physical exam: - Counseled on 150 minutes of exercise per week as tolerated, healthy eating  (including decreased daily intake of saturated fats, cholesterol, added sugars, sodium), STI prevention, and routine healthcare maintenance.  2. Screening for metabolic disorder: - KLK91+PHXT to check kidney function, liver function, and electrolyte balance.  - CMP14+EGFR  3. Screening, iron deficiency anemia: - CBC  to screen for anemia. - CBC  4. Diabetes mellitus screening: - Hemoglobin A1c to screen for pre-diabetes/diabetes. - Hemoglobin A1c  5. Screening cholesterol level: - Lipid panel to screen for high cholesterol.  - Lipid panel  6. Thyroid disorder screen: - TSH to check thyroid function.  - TSH  7. Screening for STD (sexually transmitted disease): - Cervicovaginal self-swab to screen for chlamydia, gonorrhea, trichomonas, bacterial vaginitis, and candida vaginitis. - RPR syphilis screening.  - Cervicovaginal ancillary only - RPR  8. Encounter for screening for HIV: - HIV antibody to screen for human immunodeficiency virus.  - HIV Antibody (routine testing w rflx)  9. Urinary tract infection symptoms: - No urinary tract infection.  - POCT URINALYSIS DIP (CLINITEK)  10. Chronic pain of right ankle: - Diagnostic x-ray right ankle for further evaluation.  - Referral to Orthopedic Surgery for further evaluation and management.  - DG Ankle Complete Right; Future - Ambulatory referral to Orthopedic Surgery  11. Bilateral hand pain: 12. Bilateral leg pain: - Referral to Orthopedic Surgery for further evaluation and management.  - Ambulatory referral to Orthopedic Surgery  13. Gastroesophageal reflux disease without esophagitis: - Continue Omeprazole as prescribed.  - Follow-up with primary provider as scheduled. - omeprazole (PRILOSEC) 40 MG capsule; Take 1 capsule (40 mg total) by mouth daily.  Dispense: 180 capsule; Refill: 0    Patient was given the opportunity to ask questions.  Patient verbalized understanding of the plan and was able to repeat key  elements of the plan. Patient was given clear instructions to go to Emergency Department or return to medical center if symptoms don't improve, worsen, or new problems develop.The patient verbalized understanding.   Orders Placed This Encounter  Procedures   DG Ankle Complete Right   CBC   Lipid panel   TSH   CMP14+EGFR   Hemoglobin A1c   RPR   HIV Antibody (routine testing w rflx)   Ambulatory referral to Orthopedic Surgery   POCT URINALYSIS DIP (CLINITEK)     Requested Prescriptions   Signed Prescriptions Disp Refills   omeprazole (PRILOSEC) 40 MG capsule 180 capsule 0    Sig: Take 1 capsule (40 mg total) by mouth daily.    Return in about 1 year (around 09/05/2022) for Physical per patient preference.  [name] Herter, NP

## 2021-09-04 ENCOUNTER — Ambulatory Visit (INDEPENDENT_AMBULATORY_CARE_PROVIDER_SITE_OTHER): Payer: Medicaid Other | Admitting: Family

## 2021-09-04 ENCOUNTER — Ambulatory Visit (INDEPENDENT_AMBULATORY_CARE_PROVIDER_SITE_OTHER): Payer: Medicaid Other

## 2021-09-04 ENCOUNTER — Other Ambulatory Visit (HOSPITAL_COMMUNITY)
Admission: RE | Admit: 2021-09-04 | Discharge: 2021-09-04 | Disposition: A | Discharge status: Home / Self Care | Payer: Medicaid Other | Source: Ambulatory Visit | Attending: Family | Admitting: Family

## 2021-09-04 ENCOUNTER — Encounter: Payer: Self-pay | Admitting: Family

## 2021-09-04 VITALS — BP 106/72 | HR 70 | Temp 98.5°F | Resp 18 | Ht 61.06 in | Wt 165.0 lb

## 2021-09-04 DIAGNOSIS — Z131 Encounter for screening for diabetes mellitus: Secondary | ICD-10-CM | POA: Diagnosis not present

## 2021-09-04 DIAGNOSIS — Z113 Encounter for screening for infections with a predominantly sexual mode of transmission: Secondary | ICD-10-CM | POA: Insufficient documentation

## 2021-09-04 DIAGNOSIS — G8929 Other chronic pain: Secondary | ICD-10-CM

## 2021-09-04 DIAGNOSIS — M25571 Pain in right ankle and joints of right foot: Secondary | ICD-10-CM

## 2021-09-04 DIAGNOSIS — Z1329 Encounter for screening for other suspected endocrine disorder: Secondary | ICD-10-CM

## 2021-09-04 DIAGNOSIS — Z0000 Encounter for general adult medical examination without abnormal findings: Secondary | ICD-10-CM | POA: Diagnosis not present

## 2021-09-04 DIAGNOSIS — M79604 Pain in right leg: Secondary | ICD-10-CM

## 2021-09-04 DIAGNOSIS — R399 Unspecified symptoms and signs involving the genitourinary system: Secondary | ICD-10-CM

## 2021-09-04 DIAGNOSIS — K219 Gastro-esophageal reflux disease without esophagitis: Secondary | ICD-10-CM

## 2021-09-04 DIAGNOSIS — Z130 Encounter for screening for diseases of the blood and blood-forming organs and certain disorders involving the immune mechanism: Secondary | ICD-10-CM

## 2021-09-04 DIAGNOSIS — Z114 Encounter for screening for human immunodeficiency virus [HIV]: Secondary | ICD-10-CM

## 2021-09-04 DIAGNOSIS — M79642 Pain in left hand: Secondary | ICD-10-CM

## 2021-09-04 DIAGNOSIS — M79641 Pain in right hand: Secondary | ICD-10-CM

## 2021-09-04 DIAGNOSIS — M79605 Pain in left leg: Secondary | ICD-10-CM

## 2021-09-04 DIAGNOSIS — Z13228 Encounter for screening for other metabolic disorders: Secondary | ICD-10-CM | POA: Diagnosis not present

## 2021-09-04 DIAGNOSIS — Z13220 Encounter for screening for lipoid disorders: Secondary | ICD-10-CM

## 2021-09-04 LAB — POCT URINALYSIS DIP (CLINITEK)
Bilirubin, UA: NEGATIVE
Glucose, UA: NEGATIVE mg/dL
Ketones, POC UA: NEGATIVE mg/dL
Leukocytes, UA: NEGATIVE
Nitrite, UA: NEGATIVE
POC PROTEIN,UA: NEGATIVE
Spec Grav, UA: 1.02 (ref 1.010–1.025)
Urobilinogen, UA: 0.2 E.U./dL
pH, UA: 7 (ref 5.0–8.0)

## 2021-09-04 MED ORDER — OMEPRAZOLE 40 MG PO CPDR: 40.0000 mg | DELAYED_RELEASE_CAPSULE | Freq: Every day | ORAL | 0 refills | Status: DC

## 2021-09-04 NOTE — Patient Instructions (Signed)
Preventive Care 38-38 Years Old, Female ?Preventive care refers to lifestyle choices and visits with your health care provider that can promote health and wellness. Preventive care visits are also called wellness exams. ?What can I expect for my preventive care visit? ?Counseling ?During your preventive care visit, your health care provider may ask about your: ?Medical history, including: ?Past medical problems. ?Family medical history. ?Pregnancy history. ?Current health, including: ?Menstrual cycle. ?Method of birth control. ?Emotional well-being. ?Home life and relationship well-being. ?Sexual activity and sexual health. ?Lifestyle, including: ?Alcohol, nicotine or tobacco, and drug use. ?Access to firearms. ?Diet, exercise, and sleep habits. ?Work and work Statistician. ?Sunscreen use. ?Safety issues such as seatbelt and bike helmet use. ?Physical exam ?Your health care provider may check your: ?Height and weight. These may be used to calculate your BMI (body mass index). BMI is a measurement that tells if you are at a healthy weight. ?Waist circumference. This measures the distance around your waistline. This measurement also tells if you are at a healthy weight and may help predict your risk of certain diseases, such as type 2 diabetes and high blood pressure. ?Heart rate and blood pressure. ?Body temperature. ?Skin for abnormal spots. ?What immunizations do I need? ?Vaccines are usually given at various ages, according to a schedule. Your health care provider will recommend vaccines for you based on your age, medical history, and lifestyle or other factors, such as travel or where you work. ?What tests do I need? ?Screening ?Your health care provider may recommend screening tests for certain conditions. This may include: ?Pelvic exam and Pap test. ?Lipid and cholesterol levels. ?Diabetes screening. This is done by checking your blood sugar (glucose) after you have not eaten for a while (fasting). ?Hepatitis B  test. ?Hepatitis C test. ?HIV (human immunodeficiency virus) test. ?STI (sexually transmitted infection) testing, if you are at risk. ?BRCA-related cancer screening. This may be done if you have a family history of breast, ovarian, tubal, or peritoneal cancers. ?Talk with your health care provider about your test results, treatment options, and if necessary, the need for more tests. ?Follow these instructions at home: ?Eating and drinking ? ?Eat a healthy diet that includes fresh fruits and vegetables, whole grains, lean protein, and low-fat dairy products. ?Take vitamin and mineral supplements as recommended by your health care provider. ?Do not drink alcohol if: ?Your health care provider tells you not to drink. ?You are pregnant, may be pregnant, or are planning to become pregnant. ?If you drink alcohol: ?Limit how much you have to 0-1 drink a day. ?Know how much alcohol is in your drink. In the [place] one drink equals one 12 oz bottle of beer (355 mL), one 5 oz glass of wine (148 mL), or one 1? oz glass of hard liquor (44 mL). ?Lifestyle ?Brush your teeth every morning and night with fluoride toothpaste. Floss one time each day. ?Exercise for at least 30 minutes 5 or more days each week. ?Do not use any products that contain nicotine or tobacco. These products include cigarettes, chewing tobacco, and vaping devices, such as e-cigarettes. If you need help quitting, ask your health care provider. ?Do not use drugs. ?If you are sexually active, practice safe sex. Use a condom or other form of protection to prevent STIs. ?If you do not wish to become pregnant, use a form of birth control. If you plan to become pregnant, see your health care provider for a prepregnancy visit. ?Find healthy ways to manage stress, such as: ?Meditation, yoga,  or listening to music. ?Journaling. ?Talking to a trusted person. ?Spending time with friends and family. ?Minimize exposure to UV radiation to reduce your risk of skin  cancer. ?Safety ?Always wear your seat belt while driving or riding in a vehicle. ?Do not drive: ?If you have been drinking alcohol. Do not ride with someone who has been drinking. ?If you have been using any mind-altering substances or drugs. ?While texting. ?When you are tired or distracted. ?Wear a helmet and other protective equipment during sports activities. ?If you have firearms in your house, make sure you follow all gun safety procedures. ?Seek help if you have been physically or sexually abused. ?What's next? ?Go to your health care provider once a year for an annual wellness visit. ?Ask your health care provider how often you should have your eyes and teeth checked. ?Stay up to date on all vaccines. ?This information is not intended to replace advice given to you by your health care provider. Make sure you discuss any questions you have with your health care provider. ?Document Revised: 12/10/2020 Document Reviewed: 12/10/2020 ?Elsevier Patient Education ? [place]. ? ?

## 2021-09-04 NOTE — Progress Notes (Signed)
No urinary tract infection.

## 2021-09-04 NOTE — Progress Notes (Signed)
Pt presents for annual phyiscal pain and right ankle pain follow-up, pt states that she feels like body has poor circulation  ?Desires Std panel including Syphilis and HIV  ?

## 2021-09-05 LAB — CMP14+EGFR
ALT: 8 IU/L (ref 0–32)
AST: 10 IU/L (ref 0–40)
Albumin/Globulin Ratio: 1.6 (ref 1.2–2.2)
Albumin: 4.2 g/dL (ref 3.8–4.8)
Alkaline Phosphatase: 57 IU/L (ref 44–121)
BUN/Creatinine Ratio: 11 (ref 9–23)
BUN: 9 mg/dL (ref 6–20)
Bilirubin Total: 0.4 mg/dL (ref 0.0–1.2)
CO2: 18 mmol/L — ABNORMAL LOW (ref 20–29)
Calcium: 8.6 mg/dL — ABNORMAL LOW (ref 8.7–10.2)
Chloride: 105 mmol/L (ref 96–106)
Creatinine, Ser: 0.8 mg/dL (ref 0.57–1.00)
Globulin, Total: 2.7 g/dL (ref 1.5–4.5)
Glucose: 103 mg/dL — ABNORMAL HIGH (ref 70–99)
Potassium: 3.8 mmol/L (ref 3.5–5.2)
Sodium: 138 mmol/L (ref 134–144)
Total Protein: 6.9 g/dL (ref 6.0–8.5)
eGFR: 97 mL/min/{1.73_m2} (ref >59)

## 2021-09-05 LAB — HIV ANTIBODY (ROUTINE TESTING W REFLEX): HIV Screen 4th Generation wRfx: NONREACTIVE

## 2021-09-05 LAB — HEMOGLOBIN A1C
Est. average glucose Bld gHb Est-mCnc: 108 mg/dL
Hgb A1c MFr Bld: 5.4 % (ref 4.8–5.6)

## 2021-09-05 LAB — CBC
Hematocrit: 36.7 % (ref 34.0–46.6)
Hemoglobin: 11.6 g/dL (ref 11.1–15.9)
MCH: 23.6 pg — ABNORMAL LOW (ref 26.6–33.0)
MCHC: 31.6 g/dL (ref 31.5–35.7)
MCV: 75 fL — ABNORMAL LOW (ref 79–97)
Platelets: 231 10*3/uL (ref 150–450)
RBC: 4.91 x10E6/uL (ref 3.77–5.28)
RDW: 15.4 % (ref 11.7–15.4)
WBC: 5.6 10*3/uL (ref 3.4–10.8)

## 2021-09-05 LAB — TSH: TSH: 0.331 u[IU]/mL — ABNORMAL LOW (ref 0.450–4.500)

## 2021-09-05 LAB — LIPID PANEL
Chol/HDL Ratio: 3.5 ratio (ref 0.0–4.4)
Cholesterol, Total: 162 mg/dL (ref 100–199)
HDL: 46 mg/dL (ref >39)
LDL Chol Calc (NIH): 104 mg/dL — ABNORMAL HIGH (ref 0–99)
Triglycerides: 60 mg/dL (ref 0–149)
VLDL Cholesterol Cal: 12 mg/dL (ref 5–40)

## 2021-09-05 LAB — RPR: RPR Ser Ql: NONREACTIVE

## 2021-09-05 NOTE — Progress Notes (Signed)
Kidney function normal. ? ?Liver function normal.  ? ?No diabetes.  ? ?No anemia.  ? ?No syphilis  ? ?No HIV.  ? ?The following abnormalities are noted:   ?-  LDL cholesterol (sometimes called "bad cholesterol") mildly higher than expected. High cholesterol may increase risk of heart attack and/or stroke. Consider eating more fruits, vegetables, and lean baked meats such as chicken or fish. Moderate intensity exercise at least 150 minutes as tolerated per week may help as well. No medication needed at the moment. ?-  Thyroid function lower than normal. ? ?All other values are normal, stable or within acceptable limits. ? ?Medication changes / Follow up labs / Other changes or recommendations:   ?-  Recheck fasting cholesterol routinely. ?-  Please call our office to have thyroid levels rechecked in 4 to 6 weeks. ? ?Rema Fendt, NP 09/05/2021 9:09 AM  ?

## 2021-09-06 NOTE — Progress Notes (Signed)
Right ankle no fracture and no dislocation.

## 2021-09-07 ENCOUNTER — Ambulatory Visit: Payer: Medicaid Other | Admitting: Family

## 2021-09-07 LAB — CERVICOVAGINAL ANCILLARY ONLY
Bacterial Vaginitis (gardnerella): NEGATIVE
Candida Glabrata: NEGATIVE
Candida Vaginitis: NEGATIVE
Chlamydia: NEGATIVE
Comment: NEGATIVE
Comment: NEGATIVE
Comment: NEGATIVE
Comment: NEGATIVE
Comment: NEGATIVE
Comment: NORMAL
Neisseria Gonorrhea: NEGATIVE
Trichomonas: NEGATIVE

## 2021-09-07 NOTE — Progress Notes (Signed)
Gonorrhea, Chlamydia, Trichomonas, Bacterial Vaginitis, Candida Vaginitis (sometimes called a yeast infection) negative.

## 2021-09-15 ENCOUNTER — Ambulatory Visit (INDEPENDENT_AMBULATORY_CARE_PROVIDER_SITE_OTHER): Payer: Medicaid Other

## 2021-09-15 ENCOUNTER — Encounter: Payer: Self-pay | Admitting: Orthopaedic Surgery

## 2021-09-15 ENCOUNTER — Ambulatory Visit (INDEPENDENT_AMBULATORY_CARE_PROVIDER_SITE_OTHER): Payer: Medicaid Other | Admitting: Orthopaedic Surgery

## 2021-09-15 ENCOUNTER — Other Ambulatory Visit: Payer: Self-pay

## 2021-09-15 VITALS — Ht 61.0 in | Wt 165.0 lb

## 2021-09-15 DIAGNOSIS — M79671 Pain in right foot: Secondary | ICD-10-CM

## 2021-09-15 DIAGNOSIS — M79641 Pain in right hand: Secondary | ICD-10-CM

## 2021-09-15 NOTE — Progress Notes (Signed)
? ?Office Visit Note ?  ?Patient: Ann Clark           ?Date of Birth: 12-Jan-1986           ?MRN: [id number] ?Visit Date: 09/15/2021 ?             ?Requested by: Ann Herter, NP ?[facility] ?[address] ?[place],  [place] [address] ?PCP: Ann Herter, NP ? ? ?Assessment & Plan: ?Visit Diagnoses:  ?1. Pain in right hand   ?2. Pain in right foot   ? ? ?Plan: Impression is right hand pain and right mild posterior tibial tendon insufficiency.  For the right hand I recommend over-the-counter medications and limitation of repetitive use.  I think this is along the lines of overuse condition.  I have a low suspicion of structural abnormality or compressive neuropathy.  For the right foot I think her issues are stemming from flatfoot and posterior tibial tendon insufficiency.  I recommend going to the shoe market and finding appropriate shoes and inserts.  Follow-up as needed. ? ?Follow-Up Instructions: No follow-ups on file.  ? ?Orders:  ?Orders Placed This Encounter  ?Procedures  ? XR Foot Complete Right  ? XR Hand Complete Right  ? ?No orders of the defined types were placed in this encounter. ? ? ? ? Procedures: ?No procedures performed ? ? ?Clinical Data: ?No additional findings. ? ? ?Subjective: ?Chief Complaint  ?Patient presents with  ? Right Foot - Pain  ? Right Hand - Pain  ? ? ?HPI ? ?Ann is a 36 year old female here for evaluation of right ankle and right hand symptoms.  For the right hand she has noticed that her fingers all cramp up and lock up after she has been using it a lot.  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  Denies any triggering.  Sometimes the hand needs to be shaken out.  Feels tight at times.  Denies any numbness and tingling.  In terms of right ankle she feels pain on the medial aspect of the arch that is worse with standing and activity. ? ?Review of Systems  ?Constitutional: Negative.   ?HENT: Negative.    ?Eyes: Negative.   ?Respiratory: Negative.    ?Cardiovascular:  Negative.   ?Endocrine: Negative.   ?Musculoskeletal: Negative.   ?Neurological: Negative.   ?Hematological: Negative.   ?Psychiatric/Behavioral: Negative.    ?All other systems reviewed and are negative. ? ? ?Objective: ?Vital Signs: Ht 5\' 1"  (1.549 m)   Wt 165 lb (74.8 kg)   BMI 31.18 kg/m?  ? ?Physical Exam ?Vitals and nursing note reviewed.  ?Constitutional:   ?   Appearance: She is well-developed.  ?HENT:  ?   Head: Normocephalic and atraumatic.  ?Pulmonary:  ?   Effort: Pulmonary effort is normal.  ?Abdominal:  ?   Palpations: Abdomen is soft.  ?Musculoskeletal:  ?   Cervical back: Neck supple.  ?Skin: ?   General: Skin is warm.  ?   Capillary Refill: Capillary refill takes less than 2 seconds.  ?Neurological:  ?   Mental Status: She is alert and oriented to person, place, and time.  ?Psychiatric:     ?   Behavior: Behavior normal.     ?   Thought Content: Thought content normal.     ?   Judgment: Judgment normal.  ? ? ?Ortho Exam ? ?Examination of right hand shows no focal abnormalities.  There is no bony tenderness.  She is able to  make a full composite fist.  Negative carpal tunnel compressive signs.  No skin changes.  No nodules.  Capillary refill is normal.  Sensation is normal. ? ?Examination of the right ankle shows a flexible flatfoot.  Slight tenderness to the medial aspect of the navicular and the posterior tibial tendon. ? ?Specialty Comments:  ?No specialty comments available. ? ?Imaging: ?XR Foot Complete Right ? ?Result Date: 09/15/2021 ?Type II accessory navicular ? ?XR Hand Complete Right ? ?Result Date: 09/15/2021 ?No acute or structural abnormalities.  ? ? ?PMFS History: ?Patient Active Problem List  ? Diagnosis Date Noted  ? Insomnia 06/25/2020  ? Acute cystitis without hematuria 06/11/2020  ? Bacterial vaginitis 06/11/2020  ? Anxiety state 06/11/2020  ? H. pylori infection 02/27/2020  ? Vitamin D deficiency 02/27/2020  ? Myalgia and myositis 07/30/2015  ? GERD (gastroesophageal reflux  disease) 07/30/2015  ? Arthralgia 07/16/2015  ? Bipolar I disorder ([place]) 07/16/2015  ? ?Past Medical History:  ?Diagnosis Date  ? Anemia   ? Anxiety   ? Preterm labor   ? Shoulder pain   ? Trichomonas infection 08/01/2015  ?  ?Family History  ?Problem Relation Age of Onset  ? COPD Mother   ? Diabetes Mother   ? Hyperlipidemia Mother   ? Hypertension Mother   ? Anxiety disorder Mother   ? Arthritis Mother   ? Asthma Mother   ? Depression Mother   ? Drug abuse Mother   ? Vision loss Mother   ? Kidney disease Father   ? Drug abuse Father   ? Heart Problems Father   ? ADD / ADHD Sister   ? Heart disease Sister   ? Hearing loss Son   ?  ?Past Surgical History:  ?Procedure Laterality Date  ? EUSTACHIAN TUBE DILATION    ? TONSILLECTOMY    ? ?Social History  ? ?Occupational History  ? Not on file  ?Tobacco Use  ? Smoking status: Light Smoker  ?  Packs/day: 0.25  ?  Types: Cigarettes  ?  Passive exposure: Current  ? Smokeless tobacco: Never  ? Tobacco comments:  ?  occasional  ?Vaping Use  ? Vaping Use: Never used  ?Substance and Sexual Activity  ? Alcohol use: Yes  ?  Alcohol/week: 2.0 - 3.0 standard drinks  ?  Types: 2 - 3 Shots of liquor per week  ?  Comment: two days weekly  ? Drug use: Not Currently  ?  Types: Marijuana  ?  Comment: last use approx 12/2020  ? Sexual activity: Yes  ? ? ? ? ? ? ?

## 2022-01-01 ENCOUNTER — Ambulatory Visit: Payer: Self-pay | Admitting: *Deleted

## 2022-01-01 NOTE — Telephone Encounter (Signed)
  Chief Complaint: R wrist knot Symptoms: hard not on wrist, pain,swelling Frequency: 2-3 months Pertinent Negatives: Patient denies  rash, numbness, fever Disposition: [] ED /[] Urgent Care (no appt availability in office) / [x] Appointment(In office/virtual)/ []  [facility]/ [] Home Care/ [] Refused Recommended Disposition /[] [facility]/ []  Follow-up with PCP Additional Notes: Appointment scheduled- outside of disposition- patient choice

## 2022-01-01 NOTE — Telephone Encounter (Signed)
Reason for Disposition  [1] MODERATE pain (e.g., interferes with normal activities) AND [2] present > 3 days  Answer Assessment - Initial Assessment Questions 1. ONSET: "When did the pain start?"     2-3 months 2. LOCATION: "Where is the pain located?"     Right wrist 3. PAIN: "How bad is the pain?" (Scale 1-10; or mild, moderate, severe)   - MILD (1-3): doesn't interfere with normal activities   - MODERATE (4-7): interferes with normal activities (e.g., work or school) or awakens from sleep   - SEVERE (8-10): excruciating pain, unable to use hand at all     Comes and goes- 6-9- moderate- throbbing 4. WORK OR EXERCISE: "Has there been any recent work or exercise that involved this part (i.e., hand or wrist) of the body?"     Started working out 5. CAUSE: "What do you think is causing the pain?"     Not sure- feels something hard knot 6. AGGRAVATING FACTORS: "What makes the pain worse?" (e.g., using computer)     Using the hand 7. OTHER SYMPTOMS: "Do you have any other symptoms?" (e.g., neck pain, swelling, rash, numbness, fever)     Swelling, shoulder spasms 8. PREGNANCY: "Is there any chance you are pregnant?" "When was your last menstrual period?"  Protocols used: Hand and Wrist Pain-A-AH

## 2022-01-06 NOTE — Progress Notes (Signed)
Erroneous encounter-disregard

## 2022-01-08 ENCOUNTER — Encounter: Payer: Medicaid Other | Admitting: Family

## 2022-01-13 ENCOUNTER — Ambulatory Visit
Admission: EM | Admit: 2022-01-13 | Discharge: 2022-01-13 | Disposition: A | Discharge status: Home / Self Care | Payer: Medicaid Other

## 2022-01-13 ENCOUNTER — Ambulatory Visit (INDEPENDENT_AMBULATORY_CARE_PROVIDER_SITE_OTHER): Payer: Medicaid Other

## 2022-01-13 DIAGNOSIS — M79641 Pain in right hand: Secondary | ICD-10-CM

## 2022-01-13 DIAGNOSIS — M67441 Ganglion, right hand: Secondary | ICD-10-CM | POA: Diagnosis not present

## 2022-01-13 NOTE — Discharge Instructions (Addendum)
Your x-ray today is normal, there is no concern for bony abnormality or injury.  You have an inflammation of the tendon sheath in your hand which is called a ganglion cyst.  I have enclosed some information that I hope you find helpful.  I recommend that you follow-up with your primary care provider as you have been advised to do.  You may require referral to orthopedics for further evaluation and treatment based on your primary care's recommendations.  In the meantime, you are certainly welcome to try wearing a brace and to limit use of the hand to see if you can get it to calm down.  You can also ice the area several times a day which is also a great way to reduce inflammation.  Thank you for visiting urgent care today.

## 2022-01-13 NOTE — ED Provider Notes (Signed)
[facility]    CSN: [id number] Arrival date & time: 01/13/22  1801    HISTORY   Chief Complaint  Patient presents with   Hand Pain   HPI Ann Clark is a pleasant, 36 y.o. female who presents to urgent care today. Patient presents to urgent care complaining of pain on the back of her right hand that is been going on for about a month.  Patient states there is a lump that feels like a bone that gets bigger then get smaller and causes a lot of pain.  Patient states she is unable to write, hold a mug or do a lot of the things she does at work with her hands.  EMR reviewed, patient has already been seen for this issue and was advised to follow-up with a primary care provider.  Patient has not done so.  The history is provided by the patient.   Past Medical History:  Diagnosis Date   Anemia    Anxiety    Preterm labor    Shoulder pain    Trichomonas infection 08/01/2015   Patient Active Problem List   Diagnosis Date Noted   Insomnia 06/25/2020   Acute cystitis without hematuria 06/11/2020   Bacterial vaginitis 06/11/2020   Anxiety state 06/11/2020   H. pylori infection 02/27/2020   Vitamin D deficiency 02/27/2020   Myalgia and myositis 07/30/2015   GERD (gastroesophageal reflux disease) 07/30/2015   Arthralgia 07/16/2015   Bipolar I disorder ([facility]) 07/16/2015   Past Surgical History:  Procedure Laterality Date   EUSTACHIAN TUBE DILATION     TONSILLECTOMY     OB History     Gravida  2   Para  1   Term      Preterm  1   AB      Living  1      SAB      IAB      Ectopic      Multiple      Live Births  1          Home Medications    Prior to Admission medications   Medication Sig Start Date End Date Taking? Authorizing Provider  cetirizine (ZYRTEC) 10 MG chewable tablet Chew 10 mg by mouth daily.   Yes [provider]  cetirizine (ZYRTEC) 10 MG tablet Take 10 mg by mouth daily.   Yes [provider]  diclofenac  Sodium (VOLTAREN) 1 % GEL Apply 4 g topically 4 (four) times daily. 08/07/21   Rema Fendt, NP  ibuprofen (ADVIL) 800 MG tablet Take 1 tablet (800 mg total) by mouth every 8 (eight) hours as needed. 11/06/20   Brock Bad, MD  omeprazole (PRILOSEC) 40 MG capsule Take 1 capsule (40 mg total) by mouth daily. 09/04/21 03/03/22  Rema Fendt, NP    Family History Family History  Problem Relation Age of Onset   COPD Mother    Diabetes Mother    Hyperlipidemia Mother    Hypertension Mother    Anxiety disorder Mother    Arthritis Mother    Asthma Mother    Depression Mother    Drug abuse Mother    Vision loss Mother    Kidney disease Father    Drug abuse Father    Heart Problems Father    ADD / ADHD Sister    Heart disease Sister    Hearing loss Son    Social History Social History   Tobacco Use  Smoking status: Light Smoker    Packs/day: 0.25    Types: Cigarettes    Passive exposure: Current   Smokeless tobacco: Never   Tobacco comments:    occasional  Vaping Use   Vaping Use: Never used  Substance Use Topics   Alcohol use: Yes    Alcohol/week: 2.0 - 3.0 standard drinks of alcohol    Types: 2 - 3 Shots of liquor per week    Comment: two days weekly   Drug use: Not Currently    Types: Marijuana    Comment: last use approx 12/2020   Allergies   Patient has no known allergies.  Review of Systems Review of Systems Pertinent findings revealed after performing a 14 point review of systems has been noted in the history of present illness.  Physical Exam Triage Vital Signs ED Triage Vitals  Enc Vitals Group     BP 04/24/21 0827 (!) 147/82     Pulse Rate 04/24/21 0827 72     Resp 04/24/21 0827 18     Temp 04/24/21 0827 98.3 F (36.8 C)     Temp Source 04/24/21 0827 Oral     SpO2 04/24/21 0827 98 %     Weight --      Height --      Head Circumference --      Peak Flow --      Pain Score 04/24/21 0826 5     Pain Loc --      Pain Edu? --      Excl. in GC?  --    Updated Vital Signs BP 110/72 (BP Location: Right Arm)   Pulse 67   Temp 98.1 F (36.7 C) (Oral)   Resp 18   SpO2 99%   Physical Exam Vitals and nursing note reviewed.  Constitutional:      General: She is not in acute distress.    Appearance: Normal appearance.  HENT:     Head: Normocephalic and atraumatic.  Eyes:     Pupils: Pupils are equal, round, and reactive to light.  Cardiovascular:     Rate and Rhythm: Normal rate and regular rhythm.  Pulmonary:     Effort: Pulmonary effort is normal.     Breath sounds: Normal breath sounds.  Musculoskeletal:        General: Normal range of motion.     Right hand: Deformity (Ganglion cyst local to first metacarpal) present.     Left hand: Normal.     Cervical back: Normal range of motion and neck supple.  Skin:    General: Skin is warm and dry.  Neurological:     General: No focal deficit present.     Mental Status: She is alert and oriented to person, place, and time. Mental status is at baseline.  Psychiatric:        Mood and Affect: Mood normal.        Behavior: Behavior normal.        Thought Content: Thought content normal.        Judgment: Judgment normal.     [facility] / Diagnostics / Procedures:     Radiology DG Hand Complete Right  Result Date: 01/13/2022 CLINICAL DATA:  Pain x1 month EXAM: RIGHT HAND - COMPLETE 3+ VIEW COMPARISON:  09/15/2021 FINDINGS: No recent fracture or dislocation is seen. Deformity in the base of fifth metacarpal suggest old healed fracture. Sclerosis in the shaft of distal phalanx of fifth finger may suggest bone island. No significant  interval changes are noted. IMPRESSION: No recent fracture or dislocation is seen in right hand. Electronically Signed   By: Ernie Avena M.D.   On: 01/13/2022 19:51    Procedures Procedures (including critical care time) EKG  Pending results:  Labs Reviewed - No data to display  Medications Ordered in UC: Medications - No data to  display  UC Diagnoses / Final Clinical Impressions(s)   I have reviewed the triage vital signs and the nursing notes.  Pertinent labs & imaging results that were available during my care of the patient were reviewed by me and considered in my medical decision making (see chart for details).    Final diagnoses:  Ganglion cyst of tendon sheath of right hand   Patient advised of my physical exam findings, patient requesting x-ray.  Patient advised that x-ray not indicated, patient insisted.  Patient advised x-ray of hand is normal.  As before, patient was advised to follow-up with primary care for further evaluation and treatment of ganglion cyst.  Conservative care recommended.  ED Prescriptions   None    PDMP not reviewed this encounter.  Discharge Instructions:   Discharge Instructions      Your x-ray today is normal, there is no concern for bony abnormality or injury.  You have an inflammation of the tendon sheath in your hand which is called a ganglion cyst.  I have enclosed some information that I hope you find helpful.  I recommend that you follow-up with your primary care provider as you have been advised to do.  You may require referral to orthopedics for further evaluation and treatment based on your primary care's recommendations.  In the meantime, you are certainly welcome to try wearing a brace and to limit use of the hand to see if you can get it to calm down.  You can also ice the area several times a day which is also a great way to reduce inflammation.  Thank you for visiting urgent care today.      Disposition Upon Discharge:  Condition: stable for discharge home Home: take medications as prescribed; routine discharge instructions as discussed; follow up as advised.  Patient presented with an acute illness with associated systemic symptoms and significant discomfort requiring urgent management. In my opinion, this is a condition that a prudent lay person  (someone who possesses an average knowledge of health and medicine) may potentially expect to result in complications if not addressed urgently such as respiratory distress, impairment of bodily function or dysfunction of bodily organs.   Routine symptom specific, illness specific and/or disease specific instructions were discussed with the patient and/or caregiver at length.   As such, the patient has been evaluated and assessed, work-up was performed and treatment was provided in alignment with urgent care protocols and evidence based medicine.  Patient/parent/caregiver has been advised that the patient may require follow up for further testing and treatment if the symptoms continue in spite of treatment, as clinically indicated and appropriate.  Patient/parent/caregiver has been advised to report to orthopedic urgent care clinic or return to the [facility] or PCP in 3-5 days if no better; follow-up with orthopedics, PCP or the Emergency Department if new signs and symptoms develop or if the current signs or symptoms continue to change or worsen for further workup, evaluation and treatment as clinically indicated and appropriate  The patient will follow up with their current PCP if and as advised. If the patient does not currently have a PCP we will have  assisted them in obtaining one.   The patient may need specialty follow up if the symptoms continue, in spite of conservative treatment and management, for further workup, evaluation, consultation and treatment as clinically indicated and appropriate.  Patient/parent/caregiver verbalized understanding and agreement of plan as discussed.  All questions were addressed during visit.  Please see discharge instructions below for further details of plan.  This office note has been dictated using Teaching laboratory technician.  Unfortunately, this method of dictation can sometimes lead to typographical or grammatical errors.  I apologize for your  inconvenience in advance if this occurs.  Please do not hesitate to reach out to me if clarification is needed.      Theadora Rama Scales, [place] 01/14/22 [phone]

## 2022-01-13 NOTE — ED Triage Notes (Signed)
The pt c/o right hand pain that began a month ago.

## 2022-02-17 ENCOUNTER — Ambulatory Visit: Payer: Self-pay

## 2022-02-17 NOTE — Telephone Encounter (Signed)
  Chief Complaint: IBS - constipation Symptoms: hard stools, bloated, no bm for several days Frequency: ongoing  Pertinent Negatives: Patient denies fever Disposition: [] ED /[x] Urgent Care (no appt availability in office) / [] Appointment(In office/virtual)/ []  [facility]/ [] Home Care/ [] Refused Recommended Disposition /[] [facility]/ []  Follow-up with PCP Additional Notes: Pt will try a few OTC laxatives/stool softeners if no success [facility]  This is an ongoing issue for pt. Appointment made for pt to discuss long term solutions with provider.  Pt is also having trouble with her hand and would like provider to review xray and examine hand.    Reason for Disposition  Unable to have a bowel movement (BM) without laxative or enema  Answer Assessment - Initial Assessment Questions 1. STOOL PATTERN OR FREQUENCY: "How often do you have a bowel movement (BM)?"  (Normal range: 3 times a day to every 3 days)  "When was your last BM?"       5-6 days 2. STRAINING: "Do you have to strain to have a BM?"      Straining - bloated 3. RECTAL PAIN: "Does your rectum hurt when the stool comes out?" If Yes, ask: "Do you have hemorrhoids? How bad is the pain?"  (Scale 1-10; or mild, moderate, severe)     no 4. STOOL COMPOSITION: "Are the stools hard?"      yes 5. BLOOD ON STOOLS: "Has there been any blood on the toilet tissue or on the surface of the BM?" If Yes, ask: "When was the last time?"     no 6. CHRONIC CONSTIPATION: "Is this a new problem for you?"  If No, ask: "How long have you had this problem?" (days, weeks, months)      Chronic 7. CHANGES IN DIET OR HYDRATION: "Have there been any recent changes in your diet?" "How much fluids are you drinking on a daily basis?"  "How much have you had to drink today?"     Yes - as doing well with diet changes 8. MEDICINES: "Have you been taking any new medicines?" "Are you taking any narcotic pain medicines?" (e.g.,  Dilaudid, morphine, Percocet, Vicodin)     no 9. LAXATIVES: "Have you been using any stool softeners, laxatives, or enemas?"  If Yes, ask "What, how often, and when was the last time?"     no 10. ACTIVITY:  "How much walking do you do every day?"  "Has your activity level decreased in the past week?"        yes 11. CAUSE: "What do you think is causing the constipation?"        IBS 12. OTHER SYMPTOMS: "Do you have any other symptoms?" (e.g., abdomen pain, bloating, fever, vomiting)       bloating 13. MEDICAL HISTORY: "Do you have a history of hemorrhoids, rectal fissures, or rectal surgery or rectal abscess?"         no 14. PREGNANCY: "Is there any chance you are pregnant?" "When was your last menstrual period?"       no  Protocols used: Constipation-A-AH

## 2022-02-19 NOTE — Progress Notes (Signed)
Patient ID: Ann Clark, female    DOB: 02/17/1986  MRN: [id number]  CC: IBS/Constipation  Subjective: Ann Clark is a 36 y.o. female who presents for IBS/Constipation  Her concerns today include:  02/17/2022 per triage RN note: Chief Complaint: IBS - constipation Symptoms: hard stools, bloated, no bm for several days Frequency: ongoing  Pertinent Negatives: Patient denies fever Disposition: [] ED /[x] Urgent Care (no appt availability in office) / [] Appointment(In office/virtual)/ []  [facility]/ [] Home Care/ [] Refused Recommended Disposition /[]  Mobile Bus/ []  Follow-up with PCP Additional Notes: Pt will try a few OTC laxatives/stool softeners if no success she will go to [facility] tomorrow.  This is an ongoing issue for pt. Appointment made for pt to discuss long term solutions with provider.   Pt is also having trouble with her hand and would like provider to review xray and examine hand.    Reason for Disposition  Unable to have a bowel movement (BM) without laxative or enema  Answer Assessment - Initial Assessment Questions 1. STOOL PATTERN OR FREQUENCY: "How often do you have a bowel movement (BM)?"  (Normal range: 3 times a day to every 3 days)  "When was your last BM?"       5-6 days 2. STRAINING: "Do you have to strain to have a BM?"      Straining - bloated 3. RECTAL PAIN: "Does your rectum hurt when the stool comes out?" If Yes, ask: "Do you have hemorrhoids? How bad is the pain?"  (Scale 1-10; or mild, moderate, severe)     no 4. STOOL COMPOSITION: "Are the stools hard?"      yes 5. BLOOD ON STOOLS: "Has there been any blood on the toilet tissue or on the surface of the BM?" If Yes, ask: "When was the last time?"     no 6. CHRONIC CONSTIPATION: "Is this a new problem for you?"  If No, ask: "How long have you had this problem?" (days, weeks, months)      Chronic 7. CHANGES IN DIET OR HYDRATION: "Have there been any recent changes in your diet?"  "How much fluids are you drinking on a daily basis?"  "How much have you had to drink today?"     Yes - as doing well with diet changes 8. MEDICINES: "Have you been taking any new medicines?" "Are you taking any narcotic pain medicines?" (e.g., Dilaudid, morphine, Percocet, Vicodin)     no 9. LAXATIVES: "Have you been using any stool softeners, laxatives, or enemas?"  If Yes, ask "What, how often, and when was the last time?"     no 10. ACTIVITY:  "How much walking do you do every day?"  "Has your activity level decreased in the past week?"        yes 11. CAUSE: "What do you think is causing the constipation?"        IBS 12. OTHER SYMPTOMS: "Do you have any other symptoms?" (e.g., abdomen pain, bloating, fever, vomiting)       bloating 13. MEDICAL HISTORY: "Do you have a history of hemorrhoids, rectal fissures, or rectal surgery or rectal abscess?"         no 14. PREGNANCY: "Is there any chance you are pregnant?" "When was your last menstrual period?"       no  Today's visit 02/26/2022: - Constipation persisting. Has tried several over-the-counter medications without relief.  - Having intercourse without using condom. Period currently late. Usually period begins between 20th and 25th  of each month. She is not taking any birth control.  - Bilateral ear pain comes and goes. Sounds muffled. No additional symptoms. History of ear infections and had ear tubes bilaterally during childhood.  - Right wrist pain persisting. Trying to limit use. Usually able to tolerate the pain but when having flares difficult to do so. Planning to begin cosmetology school soon and does not want to cause more irritation to her wrist.  - Mental health history. In the past established with several psychiatrists and did not go well. Reports felt they only want to prescribe and try different medications. Reports she does not want to take any medications because it made her symptoms worse and made her suicidal. Using spiritual  faith and gospel music for comfort. No current thoughts of self-harm, suicidal ideations, homicidal ideations.  - Endorses muscle aches and pain. Family history of arthritis and would like to be screened.     02/26/2022    2:33 PM 09/04/2021    2:44 PM 08/07/2021    2:22 PM 08/08/2020    3:05 PM 06/10/2020   10:07 AM  Depression screen PHQ 2/9  Decreased Interest 0 0 0 0 3  Down, Depressed, Hopeless 0 0 0 0 3  PHQ - 2 Score 0 0 0 0 6  Altered sleeping   3  3  Tired, decreased energy   1    Change in appetite   3  3  Feeling bad or failure about yourself    0  2  Trouble concentrating   3  3  Moving slowly or fidgety/restless   0  0  Suicidal thoughts   0  1  PHQ-9 Score   10  18  Difficult doing work/chores  Not difficult at all Not difficult at all       Patient Active Problem List   Diagnosis Date Noted   Insomnia 06/25/2020   Acute cystitis without hematuria 06/11/2020   Bacterial vaginitis 06/11/2020   Anxiety state 06/11/2020   H. pylori infection 02/27/2020   Vitamin D deficiency 02/27/2020   Myalgia and myositis 07/30/2015   GERD (gastroesophageal reflux disease) 07/30/2015   Arthralgia 07/16/2015   Bipolar I disorder (HCC) 07/16/2015     Current Outpatient Medications on File Prior to Visit  Medication Sig Dispense Refill   cetirizine (ZYRTEC) 10 MG chewable tablet Chew 10 mg by mouth daily.     cetirizine (ZYRTEC) 10 MG tablet Take 10 mg by mouth daily.     diclofenac Sodium (VOLTAREN) 1 % GEL Apply 4 g topically 4 (four) times daily. 350 g 0   ibuprofen (ADVIL) 800 MG tablet Take 1 tablet (800 mg total) by mouth every 8 (eight) hours as needed. 30 tablet 5   omeprazole (PRILOSEC) 40 MG capsule Take 1 capsule (40 mg total) by mouth daily. 180 capsule 0   No current facility-administered medications on file prior to visit.    No Known Allergies  Social History   Socioeconomic History   Marital status: Single    Spouse name: Not on file   Number of  children: Not on file   Years of education: Not on file   Highest education level: Not on file  Occupational History   Not on file  Tobacco Use   Smoking status: Light Smoker    Packs/day: 0.25    Types: Cigarettes    Passive exposure: Current   Smokeless tobacco: Never   Tobacco comments:    occasional  Vaping  Use   Vaping Use: Never used  Substance and Sexual Activity   Alcohol use: Yes    Alcohol/week: 2.0 - 3.0 standard drinks of alcohol    Types: 2 - 3 Shots of liquor per week    Comment: two days weekly   Drug use: Not Currently    Types: Marijuana    Comment: last use approx 12/2020   Sexual activity: Yes  Other Topics Concern   Not on file  Social History Narrative   Not on file   Social Determinants of Health   Financial Resource Strain: Not on file  Food Insecurity: Not on file  Transportation Needs: Not on file  Physical Activity: Not on file  Stress: Not on file  Social Connections: Not on file  Intimate Partner Violence: Not on file    Family History  Problem Relation Age of Onset   COPD Mother    Diabetes Mother    Hyperlipidemia Mother    Hypertension Mother    Anxiety disorder Mother    Arthritis Mother    Asthma Mother    Depression Mother    Drug abuse Mother    Vision loss Mother    Kidney disease Father    Drug abuse Father    Heart Problems Father    ADD / ADHD Sister    Heart disease Sister    Hearing loss Son     Past Surgical History:  Procedure Laterality Date   EUSTACHIAN TUBE DILATION     TONSILLECTOMY      ROS: Review of Systems Negative except as stated above  PHYSICAL EXAM: BP 115/81 (BP Location: Left Arm, Patient Position: Sitting, Cuff Size: Normal)   Pulse 71   Temp 98.3 F (36.8 C)   Resp 16   Ht 5' 0.98" (1.549 m)   Wt 163 lb (73.9 kg)   SpO2 98%   BMI 30.81 kg/m   Physical Exam HENT:     Head: Normocephalic and atraumatic.     Right Ear: Tympanic membrane, ear canal and external ear normal.      Left Ear: Tympanic membrane, ear canal and external ear normal.  Eyes:     Extraocular Movements: Extraocular movements intact.     Conjunctiva/sclera: Conjunctivae normal.     Pupils: Pupils are equal, round, and reactive to light.  Cardiovascular:     Rate and Rhythm: Normal rate and regular rhythm.     Pulses: Normal pulses.     Heart sounds: Normal heart sounds.  Pulmonary:     Effort: Pulmonary effort is normal.     Breath sounds: Normal breath sounds.  Musculoskeletal:     Cervical back: Normal range of motion and neck supple.  Neurological:     General: No focal deficit present.     Mental Status: She is alert and oriented to person, place, and time.  Psychiatric:        Mood and Affect: Mood normal.        Behavior: Behavior normal.    Results for orders placed or performed in visit on 02/26/22  POCT urine pregnancy  Result Value Ref Range   Preg Test, Ur Negative Negative    ASSESSMENT AND PLAN: 1. Chronic constipation 2. Irritable bowel syndrome, unspecified type 3. Abdominal bloating 4. Straining during bowel movements 5. Hard stool - Docusate sodium as prescribed. Counseled on medication adherence and adverse effects. - Referral to Gastroenterology for further evaluation and management. - Ambulatory referral to Gastroenterology - docusate sodium (  COLACE) 100 MG capsule; Take 1 capsule (100 mg total) by mouth 2 (two) times daily as needed for mild constipation.  Dispense: 60 capsule; Refill: 2  6. Missed menses 7. Urine pregnancy test negative - Urine pregnancy negative.  - Follow-up with primary care as scheduled if menses does not begin soon. - POCT urine pregnancy  8. Ganglion cyst of tendon sheath of right hand - Ketorolac and Triamcinolone Acetonide injections administered in office.  - Referral to Orthopedics for further evaluation and management.  - Ambulatory referral to Orthopedics - ketorolac (TORADOL) injection 60 mg - triamcinolone acetonide  (KENALOG-40) injection 40 mg  9. Routine screening for STI (sexually transmitted infection) - Routine screening. - Cervicovaginal ancillary only  10. Acute ear pain, bilateral - Amoxicillin-Clavulanate as prescribed. Counseled on medication adherence and adverse effects.  - Follow-up with primary care as scheduled. - amoxicillin-clavulanate (AUGMENTIN) 875-125 MG tablet; Take 1 tablet by mouth 2 (two) times daily for 5 days.  Dispense: 10 tablet; Refill: 0  11. Muscle pain 12. Muscle ache 13. Family history of arthritis - Screening lab.  - Arthritis Panel  14. Vitamin D deficiency - Update lab.  - Vitamin D, 25-hydroxy  15. Anxiety state 16. Insomnia, unspecified type 17. History of bipolar disorder 18. History of posttraumatic stress disorder (PTSD) - Patient denies thoughts of self-harm, suicidal ideations, homicidal ideations. - Referral to Psychiatry for further evaluation and management.  - Ambulatory referral to Psychiatry   Patient was given the opportunity to ask questions.  Patient verbalized understanding of the plan and was able to repeat key elements of the plan. Patient was given clear instructions to go to Emergency Department or return to medical center if symptoms don't improve, worsen, or new problems develop.The patient verbalized understanding.   Orders Placed This Encounter  Procedures   Arthritis Panel   Vitamin D, 25-hydroxy   Ambulatory referral to Gastroenterology   Ambulatory referral to Orthopedics   Ambulatory referral to Psychiatry   POCT urine pregnancy     Requested Prescriptions   Signed Prescriptions Disp Refills   amoxicillin-clavulanate (AUGMENTIN) 875-125 MG tablet 10 tablet 0    Sig: Take 1 tablet by mouth 2 (two) times daily for 5 days.   docusate sodium (COLACE) 100 MG capsule 60 capsule 2    Sig: Take 1 capsule (100 mg total) by mouth 2 (two) times daily as needed for mild constipation.    Follow-up with primary care as  scheduled.   Rema Fendt, NP

## 2022-02-26 ENCOUNTER — Ambulatory Visit (INDEPENDENT_AMBULATORY_CARE_PROVIDER_SITE_OTHER): Payer: Medicaid Other | Admitting: Family

## 2022-02-26 ENCOUNTER — Encounter: Payer: Self-pay | Admitting: Family

## 2022-02-26 ENCOUNTER — Other Ambulatory Visit (HOSPITAL_COMMUNITY)
Admission: RE | Admit: 2022-02-26 | Discharge: 2022-02-26 | Disposition: A | Discharge status: Home / Self Care | Payer: Medicaid Other | Source: Ambulatory Visit | Attending: Family | Admitting: Family

## 2022-02-26 VITALS — BP 115/81 | HR 71 | Temp 98.3°F | Resp 16 | Ht 60.98 in | Wt 163.0 lb

## 2022-02-26 DIAGNOSIS — K5909 Other constipation: Secondary | ICD-10-CM

## 2022-02-26 DIAGNOSIS — F411 Generalized anxiety disorder: Secondary | ICD-10-CM

## 2022-02-26 DIAGNOSIS — G4700 Insomnia, unspecified: Secondary | ICD-10-CM

## 2022-02-26 DIAGNOSIS — K589 Irritable bowel syndrome without diarrhea: Secondary | ICD-10-CM

## 2022-02-26 DIAGNOSIS — E559 Vitamin D deficiency, unspecified: Secondary | ICD-10-CM

## 2022-02-26 DIAGNOSIS — R140 Abdominal distension (gaseous): Secondary | ICD-10-CM | POA: Diagnosis not present

## 2022-02-26 DIAGNOSIS — H9203 Otalgia, bilateral: Secondary | ICD-10-CM

## 2022-02-26 DIAGNOSIS — M67441 Ganglion, right hand: Secondary | ICD-10-CM | POA: Diagnosis not present

## 2022-02-26 DIAGNOSIS — N760 Acute vaginitis: Secondary | ICD-10-CM | POA: Diagnosis present

## 2022-02-26 DIAGNOSIS — B9689 Other specified bacterial agents as the cause of diseases classified elsewhere: Secondary | ICD-10-CM | POA: Diagnosis present

## 2022-02-26 DIAGNOSIS — R198 Other specified symptoms and signs involving the digestive system and abdomen: Secondary | ICD-10-CM

## 2022-02-26 DIAGNOSIS — M791 Myalgia, unspecified site: Secondary | ICD-10-CM

## 2022-02-26 DIAGNOSIS — Z3202 Encounter for pregnancy test, result negative: Secondary | ICD-10-CM

## 2022-02-26 DIAGNOSIS — Z113 Encounter for screening for infections with a predominantly sexual mode of transmission: Secondary | ICD-10-CM

## 2022-02-26 DIAGNOSIS — Z8261 Family history of arthritis: Secondary | ICD-10-CM

## 2022-02-26 DIAGNOSIS — R195 Other fecal abnormalities: Secondary | ICD-10-CM

## 2022-02-26 DIAGNOSIS — N926 Irregular menstruation, unspecified: Secondary | ICD-10-CM

## 2022-02-26 DIAGNOSIS — Z8659 Personal history of other mental and behavioral disorders: Secondary | ICD-10-CM

## 2022-02-26 LAB — POCT URINE PREGNANCY: Preg Test, Ur: NEGATIVE

## 2022-02-26 MED ORDER — DOCUSATE SODIUM 100 MG PO CAPS: 100.0000 mg | ORAL_CAPSULE | Freq: Two times a day (BID) | ORAL | 2 refills | Status: AC | PRN

## 2022-02-26 MED ORDER — AMOXICILLIN-POT CLAVULANATE 875-125 MG PO TABS: 1.0000 | ORAL_TABLET | Freq: Two times a day (BID) | ORAL | 0 refills | Status: AC

## 2022-02-26 MED ORDER — KETOROLAC TROMETHAMINE 60 MG/2ML IM SOLN
60.0000 mg | Freq: Once | INTRAMUSCULAR | Status: AC
  Administered 2022-02-26: 60 mg via INTRAMUSCULAR

## 2022-02-26 MED ORDER — DOCUSATE SODIUM 100 MG PO CAPS: 100.0000 mg | ORAL_CAPSULE | Freq: Two times a day (BID) | ORAL | 2 refills | Status: DC | PRN

## 2022-02-26 MED ORDER — AMOXICILLIN-POT CLAVULANATE 875-125 MG PO TABS: 1.0000 | ORAL_TABLET | Freq: Two times a day (BID) | ORAL | 0 refills | Status: DC

## 2022-02-26 MED ORDER — TRIAMCINOLONE ACETONIDE 40 MG/ML IJ SUSP
40.0000 mg | Freq: Once | INTRAMUSCULAR | Status: AC
  Administered 2022-02-26: 40 mg via INTRAMUSCULAR

## 2022-02-26 NOTE — Patient Instructions (Signed)
Ganglion Cyst Drainage ?Ganglion cyst drainage is a procedure to drain a fluid-filled sac that forms near joints or on tendons or ligaments near joints (ganglion cyst). These cysts are filled with a clear fluid. They most often develop in the hand or wrist, but they can also develop in the shoulder, elbow, hip, knee, ankle, or foot. ?In this procedure, the fluid is drained from the cyst using a needle and syringe. This is called aspiration. You may need aspiration if: ?The cyst is large and unsightly. ?The cyst is painful or limits movement. ?The cyst is pushing on a nerve and causing numbness, tingling, or weakness. ?Aspiration may be done several times. If the cyst keeps coming back after aspiration, you may need surgery to remove the cyst. ?Tell a health care provider about: ?Any allergies you have. ?All medicines you are taking, including vitamins, herbs, eye drops, creams, and over-the-counter medicines. ?Any problems you or family members have had with anesthetic medicines. ?Any blood disorders you have. ?Any surgeries you have had. ?Any medical conditions you have. ?Whether you are pregnant or may be pregnant. ?What are the risks? ?Generally, this is a safe procedure. However, problems may occur, including: ?The cyst coming back again (recurrence). This is the most common risk. ?Stiffness or limited movement. ?Damage to nearby structures, such as tendons, nerves, or blood vessels. ?Infection. ?Bleeding. ?Allergic reactions to medicines. ?What happens before the procedure? ?You may have an imaging test, such as an X-ray, MRI, CT scan, or ultrasound. ?Ask your health care provider about: ?Changing or stopping your regular medicines. This is especially important if you are taking diabetes medicines or blood thinners. ?Taking medicines such as aspirin and ibuprofen. These medicines can thin your blood. Do not take these medicines unless your health care provider tells you to take them. ?Taking over-the-counter  medicines, vitamins, herbs, and supplements. ?Ask your health care provider if you should: ?Plan to have someone take you home from the hospital or clinic. ?Plan to have someone with you for 24 hours. ?Ask your health care provider what steps will be taken to help prevent infection. These steps may include washing skin with a germ-killing soap. ?What happens during the procedure? ?An IV may be inserted into one of your veins. ?You may be given: ?A medicine to help you relax (sedative). ?A medicine to numb the area (local anesthetic). This will be injected around the cyst. ?When the area is numb, a needle with a syringe will be inserted into the cyst. If the cyst is near a main blood vessel (artery), your health care provider may do an ultrasound to help avoid the artery while placing the needle. ?Fluid from the cyst will be drawn into the syringe to shrink the size of the cyst. ?In some cases, pressure may be applied to the cyst to force remaining fluid out of the cyst into the surrounding tissue under the skin. ?Medicine may be injected into the cyst to decrease inflammation. This medicine may be corticosteroids, ethanol, or hyaluronidase. ?The needle will be removed, and a small bandage (dressing) will be placed over the puncture site. ?The procedure may vary among health care providers and hospitals. ?What can I expect after the procedure? ?You will be able to go home right after the procedure. ?If you were given a sedative during the procedure, it can affect you for several hours. Do not drive or operate machinery until your health care provider says that it is safe. ?You may have to wear a splint or brace. ?  After the procedure, it is common to have some soreness, swelling, and stiffness in the affected area. ?Follow these instructions at home: ?If you have a splint or brace: ?Wear it as told by your health care provider. Remove it only as told by your health care provider. You may need to wear the splint or  brace for several days. ?Loosen the splint or brace if your fingers (or toes) tingle, become numb, or turn cold and blue. ?Keep the splint or brace clean. ?If the splint or brace is not waterproof: ?Do not let it get wet. ?Ask if you can remove it for bathing. If not, cover it with a watertight covering when you take a bath or shower. ?Managing pain, stiffness, and swelling ? ?If directed, put ice on the puncture area. To do this: ?If you have a removable splint or brace, remove it as told by your health care provider. ?Put ice in a plastic bag. ?Place a towel between your skin and the bag or between the splint or brace and the bag. ?Leave the ice on for 20 minutes, 2-3 times a day. ?Move your affected joint or extremity often to reduce stiffness and swelling. ?Raise (elevate) the puncture area above the level of your heart while you are sitting or lying down. ?Puncture site care ? ?Remove your bandage when your health care provider says you can. ?Check your puncture area every day for signs of infection. Check for: ?Redness, swelling, or pain. ?Fluid or blood. ?Warmth. ?Pus or a bad smell. ?Do not take baths, swim, or use a hot tub until your health care provider approves. ?Activity ?Return to your normal activities as told by your health care provider. Ask your health care provider what activities are safe for you. ?Avoid activities that cause pain. ?Ask your health care provider when it is safe to drive and when you should start doing movement exercises. ?General instructions ?Take over-the-counter and prescription medicines only as told by your health care provider. ?Do not use any products that contain nicotine or tobacco, such as cigarettes, e-cigarettes, and chewing tobacco. These can delay healing. If you need help quitting, ask your health care provider. ?Keep all follow-up visits as told by your health care provider. This is important. ?Contact a health care provider if: ?Medicine is not controlling your  pain. ?You have stiffness or swelling that gets worse. ?Your splint or brace is not fitting correctly, causing your fingers (or toes) to tingle, become numb, or turn cold and blue. ?You have any signs of infection at your puncture site. ?You have a fever. ?Summary ?A ganglion cyst is a fluid-filled sac that forms near joints or on tendons or ligaments near joints. ?Ganglion cysts most often develop in the hand or wrist, but they can also develop in the shoulder, elbow, hip, knee, ankle, or foot. ?In ganglion cyst drainage, a needle is placed into the cyst to drain it into a syringe (aspiration). ?Sometimes ganglion cysts come back after drainage. Aspiration may be done several times. ?If the cyst continues to come back after drainage, you may need surgery to remove the cyst. ?This information is not intended to replace advice given to you by your health care provider. Make sure you discuss any questions you have with your health care provider. ?Document Revised: 09/07/2019 Document Reviewed: 09/07/2019 ?Elsevier Patient Education ? 2023 [employer]vier [employer] ? ?

## 2022-02-26 NOTE — Progress Notes (Signed)
Pt presents for missed menses -constipation  -ear pain  -review right hand xray 07/19 stated might have ganglion cyst -pt wants to complete swab to check for BV

## 2022-02-27 LAB — ARTHRITIS PANEL
Anti Nuclear Antibody (ANA): POSITIVE — AB
Rheumatoid fact SerPl-aCnc: <10 IU/mL (ref <14.0)
Sed Rate: 14 mm/hr (ref 0–32)
Uric Acid: 3.3 mg/dL (ref 2.6–6.2)

## 2022-02-27 LAB — VITAMIN D 25 HYDROXY (VIT D DEFICIENCY, FRACTURES): Vit D, 25-Hydroxy: 14.2 ng/mL — ABNORMAL LOW (ref 30.0–100.0)

## 2022-02-28 ENCOUNTER — Other Ambulatory Visit: Payer: Self-pay | Admitting: Family

## 2022-02-28 DIAGNOSIS — Z8261 Family history of arthritis: Secondary | ICD-10-CM

## 2022-02-28 DIAGNOSIS — R768 Other specified abnormal immunological findings in serum: Secondary | ICD-10-CM

## 2022-02-28 DIAGNOSIS — E559 Vitamin D deficiency, unspecified: Secondary | ICD-10-CM

## 2022-02-28 DIAGNOSIS — M791 Myalgia, unspecified site: Secondary | ICD-10-CM

## 2022-02-28 MED ORDER — VITAMIN D (ERGOCALCIFEROL) 1.25 MG (50000 UNIT) PO CAPS: 50000.0000 [IU] | ORAL_CAPSULE | ORAL | 2 refills | Status: AC

## 2022-03-02 ENCOUNTER — Encounter: Payer: Self-pay | Admitting: Physician Assistant

## 2022-03-02 ENCOUNTER — Other Ambulatory Visit: Payer: Self-pay | Admitting: Family

## 2022-03-02 DIAGNOSIS — B9689 Other specified bacterial agents as the cause of diseases classified elsewhere: Secondary | ICD-10-CM

## 2022-03-02 LAB — CERVICOVAGINAL ANCILLARY ONLY
Bacterial Vaginitis (gardnerella): POSITIVE — AB
Candida Glabrata: NEGATIVE
Candida Vaginitis: NEGATIVE
Chlamydia: NEGATIVE
Comment: NEGATIVE
Comment: NEGATIVE
Comment: NEGATIVE
Comment: NEGATIVE
Comment: NEGATIVE
Comment: NORMAL
Neisseria Gonorrhea: NEGATIVE
Trichomonas: NEGATIVE

## 2022-03-02 MED ORDER — METRONIDAZOLE 500 MG PO TABS: 500.0000 mg | ORAL_TABLET | Freq: Two times a day (BID) | ORAL | 0 refills | Status: DC

## 2022-03-03 ENCOUNTER — Other Ambulatory Visit: Payer: Self-pay | Admitting: Family

## 2022-03-03 ENCOUNTER — Telehealth: Payer: Self-pay | Admitting: Family

## 2022-03-03 DIAGNOSIS — B9689 Other specified bacterial agents as the cause of diseases classified elsewhere: Secondary | ICD-10-CM

## 2022-03-03 DIAGNOSIS — K219 Gastro-esophageal reflux disease without esophagitis: Secondary | ICD-10-CM

## 2022-03-03 MED ORDER — METRONIDAZOLE 0.75 % VA GEL: 1.0000 | Freq: Every day | VAGINAL | 0 refills | Status: AC

## 2022-03-03 NOTE — Telephone Encounter (Signed)
Pt stated PCP sent in medication metroNIDAZOLE (FLAGYL) 500 MG tablet pt is requesting the gel. Pt stated tablets makes her sick.    [facility] #[id number] [place] Otto, [place] - [address] AT [facility] OF [facility] [address]  [address] [place] [address]  Phone: [phone] Fax: [phone]  Hours: Not open 24 hours    Please advise.

## 2022-03-03 NOTE — Telephone Encounter (Signed)
Order complete. 

## 2022-03-03 NOTE — Telephone Encounter (Signed)
Medication Refill - Medication: omeprazole (PRILOSEC) 40 MG capsule  Pt stated she had been referred to a specialist but won't see them until next month so she needs a refill.    Has the patient contacted their pharmacy? No. No, more refills. New Pharmacy.  (Agent: If no, request that the patient contact the pharmacy for the refill. If patient does not wish to contact the pharmacy document the reason why and proceed with request.)   Preferred Pharmacy (with phone number or street name):  [facility] #[id number] G[place] Otto, [place] - [address] AT [facility] OF [facility] [address]  [address] [place] [address]  Phone: [phone] Fax: [phone]  Hours: Not open 24 hours   Has the patient been seen for an appointment in the last year OR does the patient have an upcoming appointment? Yes.    Agent: Please be advised that RX refills may take up to 3 business days. We ask that you follow-up with your pharmacy.

## 2022-03-04 ENCOUNTER — Other Ambulatory Visit: Payer: Self-pay | Admitting: Obstetrics

## 2022-03-04 DIAGNOSIS — K219 Gastro-esophageal reflux disease without esophagitis: Secondary | ICD-10-CM

## 2022-03-04 NOTE — Telephone Encounter (Signed)
Requested medication (s) are due for refill today: yes  Requested medication (s) are on the active medication list: no  Last refill:  09/04/21  Future visit scheduled: no  Notes to clinic:  prescription ended 03/03/22   Requested Prescriptions  Pending Prescriptions Disp Refills   omeprazole (PRILOSEC) 40 MG capsule 180 capsule 0    Sig: Take 1 capsule (40 mg total) by mouth daily.     Gastroenterology: Proton Pump Inhibitors Passed - 03/03/2022  3:45 PM      Passed - Valid encounter within last 12 months    Recent Outpatient Visits           6 days ago Chronic constipation   Primary Care at [facility], Amy J, NP   6 months ago Annual physical exam   Primary Care at [facility], Amy J, NP   6 months ago Chronic pain of right ankle   Primary Care at [facility], Amy J, NP   1 year ago Annual physical exam   Primary Care at [facility], Amy J, NP   1 year ago Encounter to establish care   Primary Care at [facility], Salomon Fick, NP       Future Appointments             In 2 weeks August Saucer, Corrie Mckusick, MD [facility]

## 2022-03-08 NOTE — Telephone Encounter (Signed)
F/u with primary provider.

## 2022-03-24 ENCOUNTER — Ambulatory Visit: Payer: Medicaid Other | Admitting: Orthopedic Surgery

## 2022-03-24 DIAGNOSIS — M79641 Pain in right hand: Secondary | ICD-10-CM | POA: Diagnosis not present

## 2022-03-25 ENCOUNTER — Encounter: Payer: Self-pay | Admitting: Orthopedic Surgery

## 2022-03-25 ENCOUNTER — Telehealth: Payer: Self-pay | Admitting: Orthopedic Surgery

## 2022-03-25 NOTE — Progress Notes (Signed)
Office Visit Note   Patient: Ann Clark           Date of Birth: 07-22-1985           MRN: [id number] Visit Date: 03/24/2022 Requested by: [name] Herter, NP [place],  [place] [address] PCP: [name] Herter, NP  Subjective: Chief Complaint  Patient presents with   Right Hand - Pain    HPI: Rush Landmark is a 36 year old patient with right hand pain.  She is concerned that she has a "ganglion cyst" on the dorsal aspect of the hand.  She has been evaluated by the practice before but requested a different.  Been ongoing for several years.  Also reports thumb pain which prevents her from gripping and unscrewing jars.  She does use a brace.  She is right-hand dominant.  She is in beauty school.  Taking Tylenol arthritis and naproxen with some relief.  She did have an injection of Toradol and Kenalog about a month ago which helped her.  ADLs are painful and she has to adjust the hand during sleep.  Tramadol has not been helpful.  She reports some nerve type symptoms in both hands on the palmar side.  More on the right than the left              ROS: All systems reviewed are negative as they relate to the chief complaint within the history of present illness.  Patient denies  fevers or chills.   Assessment & Plan: Visit Diagnoses:  1. Pain in right hand     Plan: Impression is right hand dorsal osteophyte complex around the base of the third carpal metacarpal region.  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  Plan is Toradol and Kenalog injection again today per patient request for pain relief.  MRI right wrist to evaluate the second [facility] joint and third [facility] joint for arthritis along with first [facility] joint for ligament damage versus arthritis which is not visible on plain radiographs.  Nerve study to evaluate carpal tunnel syndrome.  Neurontin for pain.  Follow-up after that study.  Could consider ultrasound-guided injections in that first [facility] joint  versus third [facility] joint upon return visit may also need to consider pain management  Follow-Up Instructions: No follow-ups on file.   Orders:  Orders Placed This Encounter  Procedures   MR Wrist Right w/o contrast   Ambulatory referral to Physical Medicine Rehab   No orders of the defined types were placed in this encounter.     Procedures: No procedures performed   Clinical Data: No additional findings.  Objective: Vital Signs: There were no vitals taken for this visit.  Physical Exam:   Constitutional: Patient appears well-developed HEENT:  Head: Normocephalic Eyes:EOM are normal Neck: Normal range of motion Cardiovascular: Normal rate Pulmonary/chest: Effort normal Neurologic: Patient is alert Skin: Skin is warm Psychiatric: Patient has normal mood and affect   Ortho Exam: Ortho exam demonstrates full active and passive range of motion of the wrist.  No definite cyst is palpable dorsal or volar aspect of the radiocarpal joint.  She does have bony spurring at the third [facility] joint dorsally.  Confirmed under ultrasound evaluation.  Cystic structure also not present dorsally.  Radial pulse intact bilaterally.  Grind test positive for pain with axial loading but no definite crepitus is present at the first [facility] joint.  EPL FPL interosseous strength intact.  Wrist range of motion otherwise  full with flexion extension pronation supination.  Specialty Comments:  No specialty comments available.  Imaging: No results found.   PMFS History: Patient Active Problem List   Diagnosis Date Noted   Insomnia 06/25/2020   Acute cystitis without hematuria 06/11/2020   Bacterial vaginitis 06/11/2020   Anxiety state 06/11/2020   H. pylori infection 02/27/2020   Vitamin D deficiency 02/27/2020   Myalgia and myositis 07/30/2015   GERD (gastroesophageal reflux disease) 07/30/2015   Arthralgia 07/16/2015   Bipolar I disorder (HCC) 07/16/2015   Past Medical History:  Diagnosis Date    Anemia    Anxiety    Preterm labor    Shoulder pain    Trichomonas infection 08/01/2015    Family History  Problem Relation Age of Onset   COPD Mother    Diabetes Mother    Hyperlipidemia Mother    Hypertension Mother    Anxiety disorder Mother    Arthritis Mother    Asthma Mother    Depression Mother    Drug abuse Mother    Vision loss Mother    Kidney disease Father    Drug abuse Father    Heart Problems Father    ADD / ADHD Sister    Heart disease Sister    Hearing loss Son     Past Surgical History:  Procedure Laterality Date   EUSTACHIAN TUBE DILATION     TONSILLECTOMY     Social History   Occupational History   Not on file  Tobacco Use   Smoking status: Light Smoker    Packs/day: 0.25    Types: Cigarettes    Passive exposure: Current   Smokeless tobacco: Never   Tobacco comments:    occasional  Vaping Use   Vaping Use: Never used  Substance and Sexual Activity   Alcohol use: Yes    Alcohol/week: 2.0 - 3.0 standard drinks of alcohol    Types: 2 - 3 Shots of liquor per week    Comment: two days weekly   Drug use: Not Currently    Types: Marijuana    Comment: last use approx 12/2020   Sexual activity: Yes

## 2022-03-25 NOTE — Telephone Encounter (Signed)
Note done. IC advisee should be able to acces note through mychart.

## 2022-03-25 NOTE — Telephone Encounter (Signed)
Patient called asked if a note stating she was in the office yesterday be e-mailed to her at [email or web address]. The number to contact patient is [phone]

## 2022-03-26 NOTE — Progress Notes (Unsigned)
03/26/2022 Ann Clark [id number] 12-28-1985  Referring provider: Rema Fendt, NP Primary GI doctor: {acdocs:27040}  ASSESSMENT AND PLAN:   There are no diagnoses linked to this encounter.   Patient Care Team: Rema Fendt, NP as PCP - General (Nurse Practitioner)  HISTORY OF PRESENT ILLNESS: 36 y.o. female with a past medical history of bipolar 1 disorder, GERD with history of H. pylori 2021, insomnia, vitamin D deficiency and others listed below presents for evaluation of chronic constipation, bloating, straining.  2021 treated for H. pylori based off blood work, given clarithromycin, amoxicillin and omeprazole.  No eradication study.  Remains on omeprazole 40 mg daily.   She  reports that she has been smoking cigarettes. She has been smoking an average of .25 packs per day. She has been exposed to tobacco smoke. She has never used smokeless tobacco. She reports current alcohol use of about 2.0 - 3.0 standard drinks of alcohol per week. She reports that she does not currently use drugs after having used the following drugs: Marijuana.  Current Medications:     Current Outpatient Medications (Respiratory):    cetirizine (ZYRTEC) 10 MG chewable tablet, Chew 10 mg by mouth daily.   cetirizine (ZYRTEC) 10 MG tablet, Take 10 mg by mouth daily.  Current Outpatient Medications (Analgesics):    ibuprofen (ADVIL) 800 MG tablet, Take 1 tablet (800 mg total) by mouth every 8 (eight) hours as needed.   Current Outpatient Medications (Other):    diclofenac Sodium (VOLTAREN) 1 % GEL, Apply 4 g topically 4 (four) times daily.   docusate sodium (COLACE) 100 MG capsule, Take 1 capsule (100 mg total) by mouth 2 (two) times daily as needed for mild constipation.   omeprazole (PRILOSEC) 40 MG capsule, Take 1 capsule (40 mg total) by mouth daily.   Vitamin D, Ergocalciferol, (DRISDOL) 1.25 MG (50000 UNIT) CAPS capsule, Take 1 capsule (50,000 Units total) by mouth every 7 (seven)  days for 12 doses.  Medical History:  Past Medical History:  Diagnosis Date   Anemia    Anxiety    Preterm labor    Shoulder pain    Trichomonas infection 08/01/2015   Allergies: No Known Allergies   Surgical History:  She  has a past surgical history that includes Tonsillectomy and EUSTACHIAN TUBE DILATION. Family History:  Her family history includes ADD / ADHD in her sister; Anxiety disorder in her mother; Arthritis in her mother; Asthma in her mother; COPD in her mother; Depression in her mother; Diabetes in her mother; Drug abuse in her father and mother; Hearing loss in her son; Heart Problems in her father; Heart disease in her sister; Hyperlipidemia in her mother; Hypertension in her mother; Kidney disease in her father; Vision loss in her mother.  REVIEW OF SYSTEMS  : All other systems reviewed and negative except where noted in the History of Present Illness.  PHYSICAL EXAM: There were no vitals taken for this visit. General:   Pleasant, well developed female in no acute distress Head:   Normocephalic and atraumatic. Eyes:  sclerae anicteric,conjunctive pink  Heart:   {HEART EXAM HEM/ONC:21750} Pulm:  Clear anteriorly; no wheezing Abdomen:   {BlankSingle:19197::"Distended","Ridged","Soft"}, {BlankSingle:19197::"Flat","Obese","Non-distended"} AB, {BlankSingle:19197::"Absent","Hyperactive, tinkling","Hypoactive","Sluggish","Active"} bowel sounds. {actendernessAB:27319} tenderness {anatomy; site abdomen:5010}. {BlankMultiple:19196::"Without guarding","With guarding","Without rebound","With rebound"}, No organomegaly appreciated. Rectal: {acrectalexam:27461} Extremities:  {With/Without:304960234} edema. Msk: Symmetrical without gross deformities. Peripheral pulses intact.  Neurologic:  Alert and  oriented x4;  No focal deficits.  Skin:   Dry and intact without  significant lesions or rashes. Psychiatric:  Cooperative. Normal mood and affect.    Vladimir Crofts, PA-C 10:10  AM

## 2022-03-29 ENCOUNTER — Telehealth: Payer: Self-pay | Admitting: Physician Assistant

## 2022-03-29 ENCOUNTER — Encounter: Payer: Self-pay | Admitting: Physician Assistant

## 2022-03-29 ENCOUNTER — Other Ambulatory Visit (INDEPENDENT_AMBULATORY_CARE_PROVIDER_SITE_OTHER): Payer: Medicaid Other

## 2022-03-29 ENCOUNTER — Ambulatory Visit: Payer: Medicaid Other | Admitting: Physician Assistant

## 2022-03-29 VITALS — BP 90/70 | HR 72 | Ht 62.0 in | Wt 160.4 lb

## 2022-03-29 DIAGNOSIS — R718 Other abnormality of red blood cells: Secondary | ICD-10-CM

## 2022-03-29 DIAGNOSIS — R1084 Generalized abdominal pain: Secondary | ICD-10-CM

## 2022-03-29 DIAGNOSIS — K581 Irritable bowel syndrome with constipation: Secondary | ICD-10-CM

## 2022-03-29 DIAGNOSIS — K219 Gastro-esophageal reflux disease without esophagitis: Secondary | ICD-10-CM

## 2022-03-29 DIAGNOSIS — Z8619 Personal history of other infectious and parasitic diseases: Secondary | ICD-10-CM | POA: Diagnosis not present

## 2022-03-29 LAB — COMPREHENSIVE METABOLIC PANEL
ALT: 8 U/L (ref 0–35)
AST: 11 U/L (ref 0–37)
Albumin: 4 g/dL (ref 3.5–5.2)
Alkaline Phosphatase: 47 U/L (ref 39–117)
BUN: 9 mg/dL (ref 6–23)
CO2: 23 mEq/L (ref 19–32)
Calcium: 8.8 mg/dL (ref 8.4–10.5)
Chloride: 107 mEq/L (ref 96–112)
Creatinine, Ser: 0.85 mg/dL (ref 0.40–1.20)
GFR: 87.24 mL/min (ref >60.00)
Glucose, Bld: 89 mg/dL (ref 70–99)
Potassium: 3.9 mEq/L (ref 3.5–5.1)
Sodium: 135 mEq/L (ref 135–145)
Total Bilirubin: 0.3 mg/dL (ref 0.2–1.2)
Total Protein: 7.1 g/dL (ref 6.0–8.3)

## 2022-03-29 LAB — CBC WITH DIFFERENTIAL/PLATELET
Basophils Absolute: 0 10*3/uL (ref 0.0–0.1)
Basophils Relative: 0.9 % (ref 0.0–3.0)
Eosinophils Absolute: 0.1 10*3/uL (ref 0.0–0.7)
Eosinophils Relative: 1.3 % (ref 0.0–5.0)
HCT: 35.6 % — ABNORMAL LOW (ref 36.0–46.0)
Hemoglobin: 11.5 g/dL — ABNORMAL LOW (ref 12.0–15.0)
Lymphocytes Relative: 37.4 % (ref 12.0–46.0)
Lymphs Abs: 2.1 10*3/uL (ref 0.7–4.0)
MCHC: 32.3 g/dL (ref 30.0–36.0)
MCV: 74.7 fl — ABNORMAL LOW (ref 78.0–100.0)
Monocytes Absolute: 0.5 10*3/uL (ref 0.1–1.0)
Monocytes Relative: 8.2 % (ref 3.0–12.0)
Neutro Abs: 3 10*3/uL (ref 1.4–7.7)
Neutrophils Relative %: 52.2 % (ref 43.0–77.0)
Platelets: 205 10*3/uL (ref 150.0–400.0)
RBC: 4.76 Mil/uL (ref 3.87–5.11)
RDW: 14.5 % (ref 11.5–15.5)
WBC: 5.7 10*3/uL (ref 4.0–10.5)

## 2022-03-29 LAB — IBC + FERRITIN
Ferritin: 18.8 ng/mL (ref 10.0–291.0)
Iron: 67 ug/dL (ref 42–145)
Saturation Ratios: 19.4 % — ABNORMAL LOW (ref 20.0–50.0)
TIBC: 345.8 ug/dL (ref 250.0–450.0)
Transferrin: 247 mg/dL (ref 212.0–360.0)

## 2022-03-29 LAB — TSH: TSH: 0.98 u[IU]/mL (ref 0.35–5.50)

## 2022-03-29 LAB — SEDIMENTATION RATE: Sed Rate: 27 mm/hr — ABNORMAL HIGH (ref 0–20)

## 2022-03-29 MED ORDER — LINACLOTIDE 290 MCG PO CAPS: 290.0000 ug | ORAL_CAPSULE | Freq: Every day | ORAL | 0 refills | Status: DC

## 2022-03-29 NOTE — Telephone Encounter (Signed)
Inbound call from patient stating that she forgot to ask for a school note and is requesting it be sent through [place]. Please advise.

## 2022-03-29 NOTE — Progress Notes (Signed)
Agree with the assessment and plan as outlined by Vicie Mutters,  PA-C.  Patient's iron deficiency is mild and out of proportion to her microcytosis.  Query whether she may have a thalassemia trait.  Endoscopic evaluation reasonable given anemia and GI complaints.  Nevae Pinnix E. Candis Schatz, MD

## 2022-03-29 NOTE — Progress Notes (Signed)
.  seccoll

## 2022-03-29 NOTE — Patient Instructions (Signed)
Your provider has requested that you go to the basement level for lab work before leaving today. Press "B" on the elevator. The lab is located at the first door on the left as you exit the elevator.  Can try pepcid/famotadine over the counter once or twice a day  Avoid spicy and acidic foods Avoid fatty foods Limit your intake of coffee, tea, alcohol, and carbonated drinks Work to maintain a healthy weight Keep the head of the bed elevated at least 3 inches with blocks or a wedge pillow if you are having any nighttime symptoms Stay upright for 2 hours after eating Avoid meals and snacks three to four hours before bedtime   [place]  *IBS-C patients may begin to experience relief from belly pain and overall abdominal symptoms (pain, discomfort, and bloating) in about 1 week,  with symptoms typically improving over 12 weeks.  Take at least 30 minutes before the first meal of the day on an empty stomach You can have a loose stool if you eat a high-fat breakfast. Give it at least 7 days, may have more bowel movements during that time.   The diarrhea should go away and you should start having normal, complete, full bowel movements.  It may be helpful to start treatment when you can be near the comfort of your own bathroom, such as a weekend.  After you are out we can send in a prescription if you did well, there is a prescription card  Recommend starting on a fiber supplement, can try metamucil first but if this causes gas/bloating switch to benefiber or citracel, these do not cause gas.  Take with fiber with with a full 8 oz glass of water once a day. This can take 1 month to start helping, so try for at least one month.  Recommend increasing water and physical activity.   - Drink at least 64-80 ounces of water/liquid per day. - Establish a time to try to move your bowels every day.  For many people, this is after a cup of coffee or after a meal such as breakfast. - Sit all of the way back on  the toilet keeping your back fairly straight and while sitting up, try to rest the tops of your forearms on your upper thighs.   - Raising your feet with a step stool/squatty potty can be helpful to improve the angle that allows your stool to pass through the rectum. - Relax the rectum feeling it bulge toward the toilet water.  If you feel your rectum raising toward your body, you are contracting rather than relaxing. - Breathe in and slowly exhale. "Belly breath" by expanding your belly towards your belly button. Keep belly expanded as you gently direct pressure down and back to the anus.  A low pitched GRRR sound can assist with increasing intra-abdominal pressure.  - Repeat 3-4 times. If unsuccessful, contract the pelvic floor to restore normal tone and get off the toilet.  Avoid excessive straining. - To reduce excessive wiping by teaching your anus to normally contract, place hands on outer aspect of knees and resist knee movement outward.  Hold 5-10 second then place hands just inside of knees and resist inward movement of knees.  Hold 5 seconds.  Repeat a few times each way.  Go to the ER if unable to pass gas, severe AB pain, unable to hold down food, any shortness of breath of chest pain.

## 2022-03-30 ENCOUNTER — Other Ambulatory Visit: Payer: Self-pay

## 2022-03-30 ENCOUNTER — Telehealth: Payer: Self-pay

## 2022-03-30 ENCOUNTER — Other Ambulatory Visit: Payer: Medicaid Other

## 2022-03-30 DIAGNOSIS — K219 Gastro-esophageal reflux disease without esophagitis: Secondary | ICD-10-CM

## 2022-03-30 DIAGNOSIS — K581 Irritable bowel syndrome with constipation: Secondary | ICD-10-CM

## 2022-03-30 DIAGNOSIS — R1084 Generalized abdominal pain: Secondary | ICD-10-CM

## 2022-03-30 MED ORDER — PLENVU 140 G PO SOLR: 1.0000 | ORAL | 0 refills | Status: DC

## 2022-03-30 NOTE — Telephone Encounter (Signed)
School note sent via MyChart

## 2022-03-30 NOTE — Telephone Encounter (Signed)
Endo colon scheduled for 05/04/22 at 3:30 pm with Dr. Candis Schatz in the [facility] Amb ref placed. No blood thinners or diabetic. Patient provided instructions over the phone & sent to mychart. Advised she call back with any further questions. Prep sent to pharmacy.

## 2022-04-14 ENCOUNTER — Ambulatory Visit: Payer: Medicaid Other | Admitting: Orthopedic Surgery

## 2022-04-16 ENCOUNTER — Ambulatory Visit (INDEPENDENT_AMBULATORY_CARE_PROVIDER_SITE_OTHER): Payer: Medicaid Other | Admitting: Physical Medicine and Rehabilitation

## 2022-04-16 ENCOUNTER — Telehealth: Payer: Self-pay | Admitting: Physical Medicine and Rehabilitation

## 2022-04-16 DIAGNOSIS — R202 Paresthesia of skin: Secondary | ICD-10-CM

## 2022-04-16 NOTE — Telephone Encounter (Signed)
Note entered on today's appointment encounter. Patient aware. Needed note that she was seen in the office today.

## 2022-04-16 NOTE — Progress Notes (Unsigned)
   Ann Clark - 36 y.o. female MRN [id number]  Date of birth: 1986-05-22  Office Visit Note: Visit Date: 04/16/2022 PCP: [name] Herter, NP Referred by: Meredith Pel, MD  Subjective: Chief Complaint  Patient presents with   Right Wrist - Pain   Left Wrist - Pain   HPI:  Ann Clark is a 36 y.o. female who comes in today at the request of Dr. Anderson Malta for electrodiagnostic study of the Right upper extremities.  Patient is Right hand dominant. She is right-hand dominant.  She is in beauty school.  Taking Tylenol arthritis and naproxen with some relief.  She did have an injection of Toradol and Kenalog about a month ago which helped her.  ADLs are painful and she has to adjust the hand during sleep.  Tramadol has not been helpful.  She reports some nerve type symptoms in both hands on the palmar side.  More on the right than the left   ROS Otherwise per HPI.  Assessment & Plan: Visit Diagnoses:    ICD-10-CM   1. Paresthesia of skin  R20.2 NCV with EMG (electromyography)      Plan: No additional findings.   Meds & Orders: No orders of the defined types were placed in this encounter.   Orders Placed This Encounter  Procedures   NCV with EMG (electromyography)    Follow-up: No follow-ups on file.   Procedures: No procedures performed      Clinical History: No specialty comments available.     Objective:  VS:  HT:    WT:   BMI:     BP:   HR: bpm  TEMP: ( )  RESP:  Physical Exam Musculoskeletal:        General: No swelling, tenderness or deformity.     Comments: Inspection reveals no atrophy of the bilateral APB or FDI or hand intrinsics. There is no swelling, color changes, allodynia or dystrophic changes. There is 5 out of 5 strength in the bilateral wrist extension, finger abduction and long finger flexion. There is intact sensation to light touch in all dermatomal and peripheral nerve distributions. There is a negative Froment's test bilaterally.  There is a negative Tinel's test at the bilateral wrist and elbow. There is a negative Phalen's test bilaterally. There is a negative Hoffmann's test bilaterally.  Skin:    General: Skin is warm and dry.     Findings: No erythema or rash.  Neurological:     General: No focal deficit present.     Mental Status: She is alert and oriented to person, place, and time.     Motor: No weakness or abnormal muscle tone.     Coordination: Coordination normal.  Psychiatric:        Mood and Affect: Mood normal.        Behavior: Behavior normal.      Imaging: No results found.

## 2022-04-16 NOTE — Telephone Encounter (Signed)
Pt called requesting a letter be sent right away  to her mychart for today's appt with Dr. Ernestina Patches. Pt states she forgot to ask for one to give to her school. Pt phone number is [phone].

## 2022-04-16 NOTE — Progress Notes (Unsigned)
Numeric Pain Rating Scale and Functional Assessment Average Pain 7   In the last MONTH (on 0-10 scale) has pain interfered with the following?  1. General activity like being  able to carry out your everyday physical activities such as walking, climbing stairs, carrying groceries, or moving a chair?  Rating(10)     Right wrist is constant, severe pain in wrist and hand area. Left wrist is starting to have pain, but not as often

## 2022-04-19 NOTE — Procedures (Signed)
EMG & NCV Findings: All nerve conduction studies (as indicated in the following tables) were within normal limits.    All examined muscles (as indicated in the following table) showed no evidence of electrical instability.    Impression: Essentially NORMAL electrodiagnostic study of the right upper limb.  There is no significant electrodiagnostic evidence of nerve entrapment, brachial plexopathy or cervical radiculopathy.  As you know, purely sensory or demyelinating radiculopathies and chemical radiculitis may not be detected with this particular electrodiagnostic study.  Recommendations: 1.  Follow-up with referring physician. 2.  Continue current management of symptoms.  ___________________________ Naaman Plummer FAAPMR Board Certified, American Board of Physical Medicine and Rehabilitation    Nerve Conduction Studies Anti Sensory Summary Table   Stim Site NR Peak (ms) Norm Peak (ms) P-T Amp (V) Norm P-T Amp Site1 Site2 Delta-P (ms) Dist (cm) Vel (m/s) Norm Vel (m/s)  Right Median Acr Palm Anti Sensory (2nd Digit)  31.8C  Wrist    2.8 <3.6 42.3 >10 Wrist Palm 1.2 0.0    Palm    1.6 <2.0 55.7         Right Radial Anti Sensory (Base 1st Digit)  32.5C  Wrist    1.9 <3.1 48.7  Wrist Base 1st Digit 1.9 0.0    Right Ulnar Anti Sensory (5th Digit)  32.4C  Wrist    2.9 <3.7 45.3 >15.0 Wrist 5th Digit 2.9 14.0 48 >38   Motor Summary Table   Stim Site NR Onset (ms) Norm Onset (ms) O-P Amp (mV) Norm O-P Amp Site1 Site2 Delta-0 (ms) Dist (cm) Vel (m/s) Norm Vel (m/s)  Right Median Motor (Abd Poll Brev)  32.6C  Wrist    2.5 <4.2 6.5 >5 Elbow Wrist 3.5 19.5 56 >50  Elbow    6.0  6.3         Right Ulnar Motor (Abd Dig Min)  32.6C  Wrist    2.3 <4.2 9.9 >3 B Elbow Wrist 3.1 18.0 58 >53  B Elbow    5.4  10.6  A Elbow B Elbow 0.9 10.0 111 >53  A Elbow    6.3  10.3          EMG   Side Muscle Nerve Root Ins Act Fibs Psw Amp Dur Poly Recrt Int Dennie Bible Comment  Right Abd Poll Brev Median  C8-T1 Nml Nml Nml Nml Nml 0 Nml Nml   Right 1stDorInt Ulnar C8-T1 Nml Nml Nml Nml Nml 0 Nml Nml   Right PronatorTeres Median C6-7 Nml Nml Nml Nml Nml 0 Nml Nml   Right Biceps Musculocut C5-6 Nml Nml Nml Nml Nml 0 Nml Nml   Right Deltoid Axillary C5-6 Nml Nml Nml Nml Nml 0 Nml Nml     Nerve Conduction Studies Anti Sensory Left/Right Comparison   Stim Site L Lat (ms) R Lat (ms) L-R Lat (ms) L Amp (V) R Amp (V) L-R Amp (%) Site1 Site2 L Vel (m/s) R Vel (m/s) L-R Vel (m/s)  Median Acr Palm Anti Sensory (2nd Digit)  31.8C  Wrist  2.8   42.3  Wrist Palm     Palm  1.6   55.7        Radial Anti Sensory (Base 1st Digit)  32.5C  Wrist  1.9   48.7  Wrist Base 1st Digit     Ulnar Anti Sensory (5th Digit)  32.4C  Wrist  2.9   45.3  Wrist 5th Digit  48    Motor Left/Right Comparison   Stim  Site L Lat (ms) R Lat (ms) L-R Lat (ms) L Amp (mV) R Amp (mV) L-R Amp (%) Site1 Site2 L Vel (m/s) R Vel (m/s) L-R Vel (m/s)  Median Motor (Abd Poll Brev)  32.6C  Wrist  2.5   6.5  Elbow Wrist  56   Elbow  6.0   6.3        Ulnar Motor (Abd Dig Min)  32.6C  Wrist  2.3   9.9  B Elbow Wrist  58   B Elbow  5.4   10.6  A Elbow B Elbow  111   A Elbow  6.3   10.3           Waveforms:

## 2022-05-03 ENCOUNTER — Encounter: Payer: Self-pay | Admitting: Orthopedic Surgery

## 2022-05-03 ENCOUNTER — Ambulatory Visit: Payer: Medicaid Other | Admitting: Orthopedic Surgery

## 2022-05-03 DIAGNOSIS — M79641 Pain in right hand: Secondary | ICD-10-CM | POA: Diagnosis not present

## 2022-05-03 NOTE — Progress Notes (Signed)
Office Visit Note   Patient: Ann Clark           Date of Birth: [birth year]           MRN: [id number] Visit Date: 05/03/2022 Requested by: Rema Fendt, NP [address] [place],  [place] [address] PCP: Rema Fendt, NP  Subjective: Chief Complaint  Patient presents with   Other     Review study    HPI: Ann Clark is a 36 y.o. female who presents to the office reporting right hand pain.  Since she was last seen she has had nerve conduction study of the upper extremity which was normal and in particular negative for carpal tunnel syndrome.  MRI scan of the wrist is pending.  She wants to go to cosmetology school but cannot really because of her symptoms in the hand..                ROS: All systems reviewed are negative as they relate to the chief complaint within the history of present illness.  Patient denies fevers or chills.  Assessment & Plan: Visit Diagnoses:  1. 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  Wrist splint provided today.  Follow-up after MRI scan for consideration of diagnostic and therapeutic injections into the areas which appear to be most pathologic on MRI scan.  Follow-Up Instructions: No follow-ups on file.   Orders:  No orders of the defined types were placed in this encounter.  No orders of the defined types were placed in this encounter.     Procedures: No procedures performed   Clinical Data: No additional findings.  Objective: Vital Signs: There were no vitals taken for this visit.  Physical Exam:  Constitutional: Patient appears well-developed HEENT:  Head: Normocephalic Eyes:EOM are normal Neck: Normal range of motion Cardiovascular: Normal rate Pulmonary/chest: Effort normal Neurologic: Patient is alert Skin: Skin is warm Psychiatric: Patient  has normal mood and affect  Ortho Exam: Ortho exam demonstrates good grip strength.  She does have some tenderness around the third [facility] joint.  EPL FPL interosseous function intact.  Wrist range of motion intact.  No wasting of the interosseous muscles.  Radial pulse intact.  Equivocal grind test on the right.  MCP stability of the thumb feels to be intact.  Specialty Comments:  No specialty comments available.  Imaging: No results found.   PMFS History: Patient Active Problem List   Diagnosis Date Noted   Insomnia 06/25/2020   Acute cystitis without hematuria 06/11/2020   Bacterial vaginitis 06/11/2020   Anxiety state 06/11/2020   H. pylori infection 02/27/2020   Vitamin D deficiency 02/27/2020   Myalgia and myositis 07/30/2015   GERD (gastroesophageal reflux disease) 07/30/2015   Arthralgia 07/16/2015   Bipolar I disorder (H[facility]) 07/16/2015   Past Medical History:  Diagnosis Date   Anemia    Anxiety    Preterm labor    Shoulder pain    Trichomonas infection 08/01/2015    Family History  Problem Relation Age of Onset   COPD Mother    Diabetes Mother    Hyperlipidemia Mother    Hypertension Mother    Anxiety disorder Mother    Arthritis Mother    Asthma Mother    Depression Mother    Drug abuse Mother  Vision loss Mother    Kidney disease Father    Drug abuse Father    Heart Problems Father    ADD / ADHD Sister    Heart disease Sister    Hearing loss Son     Past Surgical History:  Procedure Laterality Date   EUSTACHIAN TUBE DILATION     TONSILLECTOMY     Social History   Occupational History   Not on file  Tobacco Use   Smoking status: Light Smoker    Packs/day: 0.25    Types: Cigarettes    Passive exposure: Current   Smokeless tobacco: Never   Tobacco comments:    occasional  Vaping Use   Vaping Use: Never used  Substance and Sexual Activity   Alcohol use: Yes    Alcohol/week: 2.0 - 3.0 standard drinks of alcohol    Types: 2 - 3 Shots of  liquor per week    Comment: two days weekly   Drug use: Not Currently    Types: Marijuana    Comment: last use approx 12/2020   Sexual activity: Yes

## 2022-05-03 NOTE — Telephone Encounter (Signed)
Inbound call from patient requesting instructions for her procedure tomorrow. Advised her how to find them on mychart. The patient did not follow the instructions or pick up prescription from pharmacy. She rescheduled her procedure for 12/8 and is requesting new instructions to reflect the updated procedure date.

## 2022-05-03 NOTE — Telephone Encounter (Signed)
Updated instructions sent to patient in mychart & advised she let us know if she has any further questions and to pick up prep in advance. Refer to mychart encounter 05/03/22.

## 2022-05-04 ENCOUNTER — Encounter: Payer: Medicaid Other | Admitting: Gastroenterology

## 2022-05-05 NOTE — Telephone Encounter (Signed)
Confirmed with patient that she did receive instructions & also advised she come in to complete stool sample and labs. Pt advised on when/where to go for labs. She verbalized all understanding.

## 2022-05-22 ENCOUNTER — Ambulatory Visit
Admission: RE | Admit: 2022-05-22 | Discharge: 2022-05-22 | Disposition: A | Discharge status: Home / Self Care | Payer: Medicaid Other | Source: Ambulatory Visit | Attending: Orthopedic Surgery | Admitting: Orthopedic Surgery

## 2022-05-22 DIAGNOSIS — M79641 Pain in right hand: Secondary | ICD-10-CM

## 2022-05-24 NOTE — Progress Notes (Signed)
Office Visit Note  Patient: Ann Clark             Date of Birth: [birth year]           MRN: [id number]             PCP: [name] Herter, NP Referring: [name] Herter, NP Visit Date: 05/25/2022   Subjective:  New Patient (Initial Visit) (Bil hand and feet pain)   History of Present Illness: Ann Clark is a 36 y.o. female here for evaluation of positive ANA and elevated ESR checked in association with joint pain and swelling most severely affecting her wrist.  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  Xray imaging unremarkable and had right wrist MRI on 11/25 for further evaluation.  Frequently experiences some right shoulder pain and feeling some popping or abnormalities with movement.  She has had previous shoulder injury with recurrent dislocations over the past several years.  She experiences some pain in the anterior portion of her ankles when waking from a prone sleeping position but does not bother her significantly during the day.  She is taking NSAIDs ibuprofen and naproxen over-the-counter with some relief but had to stop these due to GI irritation.  She also had steroid injections which were beneficial though had temporary relief. She denies any new skin rashes, oral ulcers, dry eyes, Raynaud's symptoms, abnormal bruising or bleeding, or lymphadenopathy.  She reports some generalized hair thinning or coming out more easily without any focal changes.    Labs reviewed 03/2022 ESR 27  02/2022 ANA pos RF neg ESR 14 Uric acid 3.3 Vit D 14.2  Activities of Daily Living:  Patient reports morning stiffness for 1 hour.   Patient Reports nocturnal pain.  Difficulty dressing/grooming: Reports Difficulty climbing stairs: Denies Difficulty getting out of chair: Denies Difficulty using hands for taps, buttons, cutlery,  and/or writing: Reports  Review of Systems  Constitutional:  Positive for fatigue.  HENT:  Negative for mouth sores and mouth dryness.   Eyes:  Negative for dryness.  Respiratory:  Negative for shortness of breath.   Cardiovascular:  Positive for chest pain and palpitations.  Gastrointestinal:  Positive for constipation. Negative for blood in stool and diarrhea.  Endocrine: Negative for increased urination.  Genitourinary:  Negative for involuntary urination.  Musculoskeletal:  Positive for joint pain, joint pain, joint swelling, myalgias, muscle weakness, morning stiffness, muscle tenderness and myalgias. Negative for gait problem.  Skin:  Positive for hair loss. Negative for color change, rash and sensitivity to sunlight.  Allergic/Immunologic: Negative for susceptible to infections.  Neurological:  Positive for dizziness and headaches.  Hematological:  Negative for swollen glands.  Psychiatric/Behavioral:  Positive for depressed mood and sleep disturbance. The patient is nervous/anxious.     PMFS History:  Patient Active Problem List   Diagnosis Date Noted   Positive ANA (antinuclear antibody) 05/25/2022   Insomnia 06/25/2020   Acute cystitis without hematuria 06/11/2020   Bacterial vaginitis 06/11/2020   Anxiety state 06/11/2020   H. pylori infection 02/27/2020   Vitamin D deficiency 02/27/2020   Myalgia 07/30/2015   GERD (gastroesophageal reflux disease) 07/30/2015   Arthralgia 07/16/2015   Bipolar I disorder ([place]) 07/16/2015    Past Medical History:  Diagnosis Date   Anemia    Anxiety    Preterm labor    Shoulder  pain    Trichomonas infection 08/01/2015    Family History  Problem Relation Age of Onset   COPD Mother    Diabetes Mother    Hyperlipidemia Mother    Hypertension Mother    Anxiety disorder Mother    Arthritis Mother    Asthma Mother    Depression Mother    Drug abuse Mother    Vision loss Mother    Kidney disease Father    Drug abuse Father     Heart Problems Father    ADD / ADHD Sister    Heart disease Sister    Clotting disorder Sister    Hearing loss Son    Past Surgical History:  Procedure Laterality Date   EUSTACHIAN TUBE DILATION     TONSILLECTOMY     Social History   Social History Narrative   Not on file   Immunization History  Administered Date(s) Administered   Tdap 07/30/2015     Objective: Vital Signs: BP 104/67 (BP Location: Right Arm, Patient Position: Sitting, Cuff Size: Normal)   Pulse 75   Resp 14   Ht _0  (1.575 m)   Wt 163 lb (73.9 kg)   BMI 29.81 kg/m    Physical Exam HENT:     Mouth/Throat:     Mouth: Mucous membranes are moist.     Pharynx: Oropharynx is clear.  Eyes:     Conjunctiva/sclera: Conjunctivae normal.  Cardiovascular:     Rate and Rhythm: Normal rate and regular rhythm.  Pulmonary:     Effort: Pulmonary effort is normal.     Breath sounds: Normal breath sounds.  Musculoskeletal:     Right lower leg: No edema.     Left lower leg: No edema.  Lymphadenopathy:     Cervical: No cervical adenopathy.  Skin:    General: Skin is warm and dry.     Findings: No rash.  Neurological:     General: No focal deficit present.     Mental Status: She is alert.  Psychiatric:        Mood and Affect: Mood normal.      Musculoskeletal Exam:  Neck full ROM, mild tenderness to pressure on right side at base of neck posterior scalene or trapezius muscles Shoulders full ROM no tenderness or swelling Elbows full ROM no tenderness or swelling Wrists full ROM mild right wrist tenderness no palpable swelling, no deviation no subluxation Fingers full ROM no tenderness or swelling Knees full ROM no tenderness or swelling Ankles full ROM no tenderness or swelling   Investigation: No additional findings.  Imaging: MR Wrist Right w/o contrast  Result Date: 05/25/2022 CLINICAL DATA:  Right wrist pain for 1 year.  No known injury. EXAM: MR OF THE RIGHT WRIST WITHOUT CONTRAST TECHNIQUE:  Multiplanar, multisequence MR imaging of the right wrist was performed. No intravenous contrast was administered. COMPARISON:  None Available. FINDINGS: Ligaments: Extrinsic ligaments are intact. Intact lunotriquetral ligament. 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 Triangular fibrocartilage: Intact Tendons: Mild tendinosis of the extensor carpi ulnaris tendon with mild peripheral subluxation as can be seen with a subsheath injury. Remainder of the extensor compartment tendons are intact. Flexor compartment tendons are intact. Carpal tunnel/median nerve: Flexor retinaculum is intact. Normal carpal tunnel without a mass. Median nerve demonstrates normal signal and caliber. Guyon's canal: Normal Guyon's canal. Normal ulnar nerve. Joint/cartilage: No focal chondral defect. Mild subchondral reactive marrow edema along the proximal ulnar aspect of the lunate. No joint effusion.  Bones/carpal alignment: No fracture, avascular necrosis, or osseous lesion. Normal alignment. Other: Muscles are normal. No fluid collection, hematoma, or soft tissue mass. IMPRESSION: 1. Mild tendinosis of the extensor carpi ulnaris tendon with mild peripheral subluxation as can be seen with a subsheath injury. 2. 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 3.  No acute osseous injury of the right wrist. Electronically Signed   By: Kathreen Devoid M.D.   On: 05/25/2022 06:39    Recent Labs: Lab Results  Component Value Date   WBC 5.7 03/29/2022   HGB 11.5 (L) 03/29/2022   PLT 205.0 03/29/2022   NA 135 03/29/2022   K 3.9 03/29/2022   CL 107 03/29/2022   CO2 23 03/29/2022   GLUCOSE 89 03/29/2022   BUN 9 03/29/2022   CREATININE 0.85 03/29/2022   BILITOT 0.3 03/29/2022   ALKPHOS 47 03/29/2022   AST 11 03/29/2022   ALT 8 03/29/2022   PROT 7.1 03/29/2022   ALBUMIN 4.0 03/29/2022   CALCIUM 8.8 03/29/2022   GFRAA 103 08/08/2020    Speciality Comments: No specialty comments  available.  Procedures:  No procedures performed Allergies: Patient has no known allergies.   Assessment / Plan:     Visit Diagnoses: Positive ANA (antinuclear antibody) - Plan: RNP Antibody, Anti-Smith antibody, Sjogrens syndrome-A extractable nuclear antibody, Anti-DNA antibody, double-stranded, C3 and C4  Positive ANA has some joint pain and swelling but localized to 1 area and no concerning systemic findings.  Will check specific antibody panel as listed above and serum complements but lower pretest suspicion of a systemic connective tissue disease.  Arthralgia, unspecified joint - Plan: celecoxib (CELEBREX) 100 MG capsule  Joint pains throughout the right upper extremity seems more suspicious for injury related process especially with the right shoulder with repetitive dislocations.  MRI of the wrist with tendon and ligamentous abnormalities I am not sure if this is something requiring orthopedic intervention or more supportive approach.  Previously had some relief with NSAIDs but had GI intolerance recommend trial of 100 mg Celebrex to see if this may be better tolerated as a treatment to mitigate symptoms while undergoing workup or definitive management.  Orders: Orders Placed This Encounter  Procedures   RNP Antibody   Anti-Smith antibody   Sjogrens syndrome-A extractable nuclear antibody   Anti-DNA antibody, double-stranded   C3 and C4   Meds ordered this encounter  Medications   celecoxib (CELEBREX) 100 MG capsule    Sig: Take 1 capsule (100 mg total) by mouth 2 (two) times daily as needed for moderate pain.    Dispense:  60 capsule    Refill:  0     Follow-Up Instructions: Return if symptoms worsen or fail to improve.   Collier Salina, MD  Note - This record has been created using [employer].  Chart creation errors have been sought, but may not always  have been located. Such creation errors do not reflect on  the standard of medical care.

## 2022-05-25 ENCOUNTER — Other Ambulatory Visit: Payer: Medicaid Other

## 2022-05-25 ENCOUNTER — Encounter: Payer: Self-pay | Admitting: Internal Medicine

## 2022-05-25 ENCOUNTER — Ambulatory Visit: Payer: Medicaid Other | Attending: Internal Medicine | Admitting: Internal Medicine

## 2022-05-25 VITALS — BP 104/67 | HR 75 | Resp 14 | Ht 62.0 in | Wt 163.0 lb

## 2022-05-25 DIAGNOSIS — K219 Gastro-esophageal reflux disease without esophagitis: Secondary | ICD-10-CM

## 2022-05-25 DIAGNOSIS — R768 Other specified abnormal immunological findings in serum: Secondary | ICD-10-CM

## 2022-05-25 DIAGNOSIS — M791 Myalgia, unspecified site: Secondary | ICD-10-CM | POA: Diagnosis not present

## 2022-05-25 DIAGNOSIS — M2550 Pain in unspecified joint: Secondary | ICD-10-CM | POA: Diagnosis not present

## 2022-05-25 DIAGNOSIS — Z8619 Personal history of other infectious and parasitic diseases: Secondary | ICD-10-CM

## 2022-05-25 MED ORDER — CELECOXIB 100 MG PO CAPS: 100.0000 mg | ORAL_CAPSULE | Freq: Two times a day (BID) | ORAL | 0 refills | Status: DC | PRN

## 2022-05-26 LAB — ANTI-DNA ANTIBODY, DOUBLE-STRANDED: ds DNA Ab: <1 IU/mL

## 2022-05-26 LAB — HELICOBACTER PYLORI  SPECIAL ANTIGEN
MICRO NUMBER:: 14239921
SPECIMEN QUALITY: ADEQUATE

## 2022-05-26 LAB — C3 AND C4
C3 Complement: 117 mg/dL (ref 83–193)
C4 Complement: 24 mg/dL (ref 15–57)

## 2022-05-26 LAB — ANTI-SMITH ANTIBODY: ENA SM Ab Ser-aCnc: <1 AI

## 2022-05-26 LAB — SJOGRENS SYNDROME-A EXTRACTABLE NUCLEAR ANTIBODY: SSA (Ro) (ENA) Antibody, IgG: 1.1 AI — AB

## 2022-05-26 LAB — RNP ANTIBODY: Ribonucleic Protein(ENA) Antibody, IgG: <1 AI

## 2022-05-31 ENCOUNTER — Ambulatory Visit (INDEPENDENT_AMBULATORY_CARE_PROVIDER_SITE_OTHER): Payer: Medicaid Other | Admitting: Orthopedic Surgery

## 2022-05-31 DIAGNOSIS — M25311 Other instability, right shoulder: Secondary | ICD-10-CM | POA: Diagnosis not present

## 2022-06-01 ENCOUNTER — Encounter: Payer: Self-pay | Admitting: Orthopedic Surgery

## 2022-06-01 NOTE — Progress Notes (Unsigned)
Office Visit Note   Patient: Ann Clark           Date of Birth: 1985/07/29           MRN: [id number] Visit Date: 05/31/2022 Requested by: Rema Fendt, NP [address] [place],  [place] [address] PCP: Rema Fendt, NP  Subjective: Chief Complaint  Patient presents with   Other    Scan review    HPI: Ann Clark is a 36 y.o. female who presents to the office reporting continued right hand and wrist pain along with right shoulder instability.  Pain in the wrist is dorsal and central.  MRI scan is reviewed with the patient and that shows mild tendinosis of the extensor carpi ulnaris tendon with degeneration of the scapholunate ligament without discrete tearing.  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  Hard for her to sweet.  She has been doing some training in beauty and that also gives her pain.  She also has had a nerve study which is negative.  Rheumatologic workup also negative.  Patient also reports significant right shoulder pain.  Has an MRI scan in 2017 which showed anterior-inferior glenoid labral disruption and she has had multiple dislocations and subluxations since that time..                ROS: All systems reviewed are negative as they relate to the chief complaint within the history of present illness.  Patient denies fevers or chills.  Assessment & Plan: Visit Diagnoses:  1. Instability of right shoulder joint     Plan: Impression is right hand and wrist pain.  Nothing definitively operative by my examination in that right hand or wrist.  She is requesting an injection today for pain relief which has helped her in the past which did include Toradol and anti-inflammatory steroid.  Regarding the shoulder she does have significant pathology as well as continued shoulder instability.  I think we could stabilize the shoulder arthroscopically.  The risk and benefits of that procedure were discussed including but limited to infection nerve and  vessel damage shoulder stiffness as well as recurrent instability.  Patient understands the risk and benefits associated with that endeavor.  She would like to proceed with surgery.  Regarding the right hand and wrist we had a very long and extensive discussion today.  She is somewhat frustrated about not really finding any answers about the right wrist.  I did tell her that seeing a hand surgeon could be a good idea to get the best and most definitive answer about whether or not there is any operative pathology in the wrist.  I did check her today and her ECU tendon does not subluxate with pronation and supination.  Nonetheless she is wants to hold off on any further workup in terms of referral to hand specialist for now.  She will follow-up 1 week after her shoulder stabilization.  The extensive nature of the rehabilitation required was also discussed.  All questions answered  Follow-Up Instructions: No follow-ups on file.   Orders:  No orders of the defined types were placed in this encounter.  No orders of the defined types were placed in this encounter.     Procedures: No procedures performed   Clinical Data: No additional findings.  Objective: Vital Signs: There were no vitals taken for this visit.  Physical Exam:  Constitutional: Patient appears well-developed HEENT:  Head: Normocephalic Eyes:EOM are normal Neck: Normal range of motion Cardiovascular:  Normal rate Pulmonary/chest: Effort normal Neurologic: Patient is alert Skin: Skin is warm Psychiatric: Patient has normal mood and affect  Ortho Exam: Ortho exam demonstrates on that right hand symmetric EPL FPL interosseous strength.  No subluxation or tenderness of the ECU tendon with pronation supination of the hand.  Radial pulse intact.  Has a little bit of central dorsal tenderness but no effusion is present in the wrist on the right-hand side.  Range of motion intact with flexion extension radial ulnar deviation and is  symmetric right wrist versus left wrist.  No masses lymphadenopathy or skin changes noted in the right wrist region.  Patient does have pretty reasonable shoulder range of motion with anterior apprehension and less than a centimeter sulcus sign.  Rotator cuff strength on the right intact infraspinatus supraspinatus and subscap muscle testing.  No discrete AC joint tenderness on the right.  Range of motion is approximately 60/100/175.  Specialty Comments:  No specialty comments available.  Imaging: No results found.   PMFS History: Patient Active Problem List   Diagnosis Date Noted   Positive ANA (antinuclear antibody) 05/25/2022   Insomnia 06/25/2020   Acute cystitis without hematuria 06/11/2020   Bacterial vaginitis 06/11/2020   Anxiety state 06/11/2020   H. pylori infection 02/27/2020   Vitamin D deficiency 02/27/2020   Myalgia 07/30/2015   GERD (gastroesophageal reflux disease) 07/30/2015   Arthralgia 07/16/2015   Bipolar I disorder ([place]) 07/16/2015   Past Medical History:  Diagnosis Date   Anemia    Anxiety    Preterm labor    Shoulder pain    Trichomonas infection 08/01/2015    Family History  Problem Relation Age of Onset   COPD Mother    Diabetes Mother    Hyperlipidemia Mother    Hypertension Mother    Anxiety disorder Mother    Arthritis Mother    Asthma Mother    Depression Mother    Drug abuse Mother    Vision loss Mother    Kidney disease Father    Drug abuse Father    Heart Problems Father    ADD / ADHD Sister    Heart disease Sister    Clotting disorder Sister    Hearing loss Son     Past Surgical History:  Procedure Laterality Date   EUSTACHIAN TUBE DILATION     TONSILLECTOMY     Social History   Occupational History   Not on file  Tobacco Use   Smoking status: Former    Packs/day: 0.10    Years: 5.00    Total pack years: 0.50    Types: Cigarettes    Quit date: 2022    Years since quitting: 1.9    Passive exposure: Past   Smokeless  tobacco: Never   Tobacco comments:    occasional  Vaping Use   Vaping Use: Some days  Substance and Sexual Activity   Alcohol use: Not Currently   Drug use: Not Currently    Types: Marijuana    Comment: last use approx 12/2020   Sexual activity: Yes

## 2022-06-03 ENCOUNTER — Other Ambulatory Visit: Payer: Self-pay | Admitting: Family

## 2022-06-03 DIAGNOSIS — G8929 Other chronic pain: Secondary | ICD-10-CM

## 2022-06-03 DIAGNOSIS — K219 Gastro-esophageal reflux disease without esophagitis: Secondary | ICD-10-CM

## 2022-06-03 MED ORDER — DICLOFENAC SODIUM 1 % EX GEL: 4.0000 g | Freq: Four times a day (QID) | CUTANEOUS | 0 refills | Status: DC

## 2022-06-03 MED ORDER — OMEPRAZOLE 40 MG PO CPDR: 40.0000 mg | DELAYED_RELEASE_CAPSULE | Freq: Every day | ORAL | 0 refills | Status: DC

## 2022-06-04 ENCOUNTER — Ambulatory Visit (AMBULATORY_SURGERY_CENTER): Payer: Medicaid Other | Admitting: Gastroenterology

## 2022-06-04 ENCOUNTER — Encounter: Payer: Self-pay | Admitting: Gastroenterology

## 2022-06-04 VITALS — BP 119/66 | HR 46 | Temp 98.0°F | Resp 13 | Ht 62.0 in | Wt 160.0 lb

## 2022-06-04 DIAGNOSIS — R140 Abdominal distension (gaseous): Secondary | ICD-10-CM | POA: Diagnosis not present

## 2022-06-04 DIAGNOSIS — D500 Iron deficiency anemia secondary to blood loss (chronic): Secondary | ICD-10-CM

## 2022-06-04 DIAGNOSIS — K581 Irritable bowel syndrome with constipation: Secondary | ICD-10-CM

## 2022-06-04 DIAGNOSIS — K2950 Unspecified chronic gastritis without bleeding: Secondary | ICD-10-CM | POA: Diagnosis not present

## 2022-06-04 DIAGNOSIS — K219 Gastro-esophageal reflux disease without esophagitis: Secondary | ICD-10-CM

## 2022-06-04 MED ORDER — SODIUM CHLORIDE 0.9 % IV SOLN: 500.0000 mL | Freq: Once | INTRAVENOUS | Status: DC

## 2022-06-04 NOTE — Progress Notes (Signed)
Called to room to assist during endoscopic procedure.  Patient ID and intended procedure confirmed with present staff. Received instructions for my participation in the procedure from the performing physician.  

## 2022-06-04 NOTE — Op Note (Signed)
[facility] Patient Name: Ann Clark Procedure Date: 06/04/2022 2:56 PM MRN: [id number] Endoscopist: Lorin Picket E. Tomasa Rand , MD, [id number] Age: 36 Referring MD:  Date of Birth: 1985/08/04 Gender: Female Account #: [id number] Procedure:                Upper GI endoscopy Indications:              Iron deficiency anemia, Abdominal bloating, nausea Medicines:                Monitored Anesthesia Care Procedure:                Pre-Anesthesia Assessment:                           - Prior to the procedure, a History and Physical                            was performed, and patient medications and                            allergies were reviewed. The patient's tolerance of                            previous anesthesia was also reviewed. The risks                            and benefits of the procedure and the sedation                            options and risks were discussed with the patient.                            All questions were answered, and informed consent                            was obtained. Prior Anticoagulants: The patient has                            taken no anticoagulant or antiplatelet agents. ASA                            Grade Assessment: II - A patient with mild systemic                            disease. After reviewing the risks and benefits,                            the patient was deemed in satisfactory condition to                            undergo the procedure.                           After obtaining informed consent, the endoscope was  passed under direct vision. Throughout the                            procedure, the patient's blood pressure, pulse, and                            oxygen saturations were monitored continuously. The                            GIF [id number] #[id number] was introduced through the                            mouth, and advanced to the second part of duodenum.                             The upper GI endoscopy was accomplished without                            difficulty. The patient tolerated the procedure                            well. Scope In: Scope Out: Findings:                 The examined portions of the nasopharynx,                            oropharynx and larynx were normal.                           The examined esophagus was normal.                           The entire examined stomach was normal. Biopsies                            were taken with a cold forceps for Helicobacter                            pylori testing. Estimated blood loss was minimal.                           The examined duodenum was normal. Biopsies for                            histology were taken with a cold forceps for                            evaluation of celiac disease. Estimated blood loss                            was minimal. Complications:            No immediate complications. Estimated Blood Loss:     Estimated blood loss was minimal. Impression:               -  The examined portions of the nasopharynx,                            oropharynx and larynx were normal.                           - Normal esophagus.                           - Normal stomach. Biopsied.                           - Normal examined duodenum. Biopsied.                           - No endoscopic abnormalities to explain iron                            deficiency or patient's symptoms Recommendation:           - Patient has a contact number available for                            emergencies. The signs and symptoms of potential                            delayed complications were discussed with the                            patient. Return to normal activities tomorrow.                            Written discharge instructions were provided to the                            patient.                           - Resume previous diet.                           - Continue present  medications.                           - Await pathology results. Angelize Ryce E. Tomasa Rand, MD 06/04/2022 3:27:19 PM This report has been signed electronically.

## 2022-06-04 NOTE — Progress Notes (Signed)
Report to PACU, RN, vss, BBS= Clear.  

## 2022-06-04 NOTE — Op Note (Signed)
[facility] Patient Name: Ann Clark Procedure Date: 06/04/2022 2:55 PM MRN: [id number] Endoscopist: Lorin Picket E. Tomasa Rand , MD, [id number] Age: 36 Referring MD:  Date of Birth: 26-Nov-1985 Gender: Female Account #: [id number] Procedure:                Colonoscopy Indications:              Iron deficiency anemia Medicines:                Monitored Anesthesia Care Procedure:                Pre-Anesthesia Assessment:                           - Prior to the procedure, a History and Physical                            was performed, and patient medications and                            allergies were reviewed. The patient's tolerance of                            previous anesthesia was also reviewed. The risks                            and benefits of the procedure and the sedation                            options and risks were discussed with the patient.                            All questions were answered, and informed consent                            was obtained. Prior Anticoagulants: The patient has                            taken no anticoagulant or antiplatelet agents. ASA                            Grade Assessment: II - A patient with mild systemic                            disease. After reviewing the risks and benefits,                            the patient was deemed in satisfactory condition to                            undergo the procedure.                           After obtaining informed consent, the colonoscope  was passed under direct vision. Throughout the                            procedure, the patient's blood pressure, pulse, and                            oxygen saturations were monitored continuously. The                            Olympus [id number]) Colonoscope was                            introduced through the anus and advanced to the the                            terminal ileum, with  identification of the                            appendiceal orifice and IC valve. The colonoscopy                            was performed without difficulty. The patient                            tolerated the procedure well. The quality of the                            bowel preparation was excellent. The terminal                            ileum, ileocecal valve, appendiceal orifice, and                            rectum were photographed. The bowel preparation                            used was Miralax and SUPREP via extended prep with                            split dose instruction. Scope In: 3:13:52 PM Scope Out: 3:21:31 PM Scope Withdrawal Time: 0 hours 5 minutes 7 seconds  Total Procedure Duration: 0 hours 7 minutes 39 seconds  Findings:                 The perianal and digital rectal examinations were                            normal. Pertinent negatives include normal                            sphincter tone and no palpable rectal lesions.                           The colon (entire examined portion) appeared normal.  The terminal ileum appeared normal.                           The retroflexed view of the distal rectum and anal                            verge was normal and showed no anal or rectal                            abnormalities. Complications:            No immediate complications. Estimated Blood Loss:     Estimated blood loss: none. Impression:               - The entire examined colon is normal.                           - The examined portion of the ileum was normal.                           - The distal rectum and anal verge are normal on                            retroflexion view.                           - No specimens collected. Recommendation:           - Patient has a contact number available for                            emergencies. The signs and symptoms of potential                            delayed  complications were discussed with the                            patient. Return to normal activities tomorrow.                            Written discharge instructions were provided to the                            patient.                           - Resume previous diet.                           - Continue present medications.                           - Repeat colonoscopy in 10 years for screening                            purposes.                           -  Patient's iron deficiency can be attributed to                            menstrual blood loss.                           - Follow up in our office as needed for ongoing                            management of chronic GI symptoms. Oyindamola Key E. Tomasa Rand, MD 06/04/2022 3:30:36 PM This report has been signed electronically.

## 2022-06-04 NOTE — Progress Notes (Signed)
Pt's states no medical or surgical changes since previsit or office visit. 

## 2022-06-04 NOTE — Patient Instructions (Signed)
Await pathology results.  YOU HAD AN ENDOSCOPIC PROCEDURE TODAY AT THE [facility]:   Refer to the procedure report that was given to you for any specific questions about what was found during the examination.  If the procedure report does not answer your questions, please call your gastroenterologist to clarify.  If you requested that your care partner not be given the details of your procedure findings, then the procedure report has been included in a sealed envelope for you to review at your convenience later.  YOU SHOULD EXPECT: Some feelings of bloating in the abdomen. Passage of more gas than usual.  Walking can help get rid of the air that was put into your GI tract during the procedure and reduce the bloating. If you had a lower endoscopy (such as a colonoscopy or flexible sigmoidoscopy) you may notice spotting of blood in your stool or on the toilet paper. If you underwent a bowel prep for your procedure, you may not have a normal bowel movement for a few days.  Please Note:  You might notice some irritation and congestion in your nose or some drainage.  This is from the oxygen used during your procedure.  There is no need for concern and it should clear up in a day or so.  SYMPTOMS TO REPORT IMMEDIATELY:  Following lower endoscopy (colonoscopy or flexible sigmoidoscopy):  Excessive amounts of blood in the stool  Significant tenderness or worsening of abdominal pains  Swelling of the abdomen that is new, acute  Fever of 100F or higher  Following upper endoscopy (EGD)  Vomiting of blood or coffee ground material  New chest pain or pain under the shoulder blades  Painful or persistently difficult swallowing  New shortness of breath  Fever of 100F or higher  Black, tarry-looking stools  For urgent or emergent issues, a gastroenterologist can be reached at any hour by calling [phone]. Do not use MyChart messaging for urgent concerns.    DIET:  We do recommend a  small meal at first, but then you may proceed to your regular diet.  Drink plenty of fluids but you should avoid alcoholic beverages for 24 hours.  ACTIVITY:  You should plan to take it easy for the rest of today and you should NOT DRIVE or use heavy machinery until tomorrow (because of the sedation medicines used during the test).    FOLLOW UP: Our staff will call the number listed on your records the next business day following your procedure.  We will call around 7:15- 8:00 am to check on you and address any questions or concerns that you may have regarding the information given to you following your procedure. If we do not reach you, we will leave a message.     If any biopsies were taken you will be contacted by phone or by letter within the next 1-3 weeks.  Please call us at [phone] if you have not heard about the biopsies in 3 weeks.    SIGNATURES/CONFIDENTIALITY: You and/or your care partner have signed paperwork which will be entered into your electronic medical record.  These signatures attest to the fact that that the information above on your After Visit Summary has been reviewed and is understood.  Full responsibility of the confidentiality of this discharge information lies with you and/or your care-partner.  

## 2022-06-04 NOTE — Progress Notes (Signed)
[facility] History and Physical   Primary Care Physician:  Rema Fendt, NP   Reason for Procedure:   Iron deficiency anemia, chronic bloating, constipation  Plan:    EGD, colonoscopy     HPI: Ann Clark is a 36 y.o. female undergoing initial iron deficiency anemia and chronic GI symptoms of abdominal bloating, nausea, constipation and lower abdominal pain.  Her symptoms have improved slightly since her office visit in October.   Past Medical History:  Diagnosis Date   Anemia    Anxiety    Preterm labor    Shoulder pain    Trichomonas infection 08/01/2015    Past Surgical History:  Procedure Laterality Date   EUSTACHIAN TUBE DILATION     TONSILLECTOMY      Prior to Admission medications   Medication Sig Start Date End Date Taking? Authorizing Provider  celecoxib (CELEBREX) 100 MG capsule Take 1 capsule (100 mg total) by mouth 2 (two) times daily as needed for moderate pain. 05/25/22  Yes Rice, Jamesetta Orleans, MD  cetirizine (ZYRTEC) 10 MG chewable tablet Chew 10 mg by mouth daily.   Yes [provider]  diclofenac Sodium (VOLTAREN) 1 % GEL Apply 4 g topically 4 (four) times daily. 06/03/22  Yes Zonia Kief, Amy J, NP  docusate sodium (COLACE) 100 MG capsule Take 1 capsule (100 mg total) by mouth 2 (two) times daily as needed for mild constipation. 02/26/22  Yes Zonia Kief, Amy J, NP  omeprazole (PRILOSEC) 40 MG capsule Take 1 capsule (40 mg total) by mouth daily. 06/03/22 11/30/22 Yes Zonia Kief, Amy J, NP  Acetaminophen (TYLENOL PO) Take by mouth.    [provider]  cetirizine (ZYRTEC) 10 MG tablet Take 10 mg by mouth daily. Patient not taking: Reported on 03/29/2022    [provider]  linaclotide Karlene Einstein) 290 MCG CAPS capsule Take 1 capsule (290 mcg total) by mouth daily before breakfast. Patient not taking: Reported on 05/25/2022 03/29/22 06/27/22  Doree Albee, PA-C  metroNIDAZOLE (FLAGYL) 500 MG tablet Take 500 mg by mouth 2 (two)  times daily. Patient not taking: Reported on 05/25/2022 03/03/22   [provider]    Current Outpatient Medications  Medication Sig Dispense Refill   celecoxib (CELEBREX) 100 MG capsule Take 1 capsule (100 mg total) by mouth 2 (two) times daily as needed for moderate pain. 60 capsule 0   cetirizine (ZYRTEC) 10 MG chewable tablet Chew 10 mg by mouth daily.     diclofenac Sodium (VOLTAREN) 1 % GEL Apply 4 g topically 4 (four) times daily. 350 g 0   docusate sodium (COLACE) 100 MG capsule Take 1 capsule (100 mg total) by mouth 2 (two) times daily as needed for mild constipation. 60 capsule 2   omeprazole (PRILOSEC) 40 MG capsule Take 1 capsule (40 mg total) by mouth daily. 180 capsule 0   Acetaminophen (TYLENOL PO) Take by mouth.     cetirizine (ZYRTEC) 10 MG tablet Take 10 mg by mouth daily. (Patient not taking: Reported on 03/29/2022)     linaclotide (LINZESS) 290 MCG CAPS capsule Take 1 capsule (290 mcg total) by mouth daily before breakfast. (Patient not taking: Reported on 05/25/2022) 90 capsule 0   metroNIDAZOLE (FLAGYL) 500 MG tablet Take 500 mg by mouth 2 (two) times daily. (Patient not taking: Reported on 05/25/2022)     Current Facility-Administered Medications  Medication Dose Route Frequency Provider Last Rate Last Admin   0.9 %  sodium chloride infusion  500 mL Intravenous Once  Jenel Lucks, MD        Allergies as of 06/04/2022   (No Known Allergies)    Family History  Problem Relation Age of Onset   COPD Mother    Diabetes Mother    Hyperlipidemia Mother    Hypertension Mother    Anxiety disorder Mother    Arthritis Mother    Asthma Mother    Depression Mother    Drug abuse Mother    Vision loss Mother    Kidney disease Father    Drug abuse Father    Heart Problems Father    ADD / ADHD Sister    Heart disease Sister    Clotting disorder Sister    Hearing loss Son     Social History   Socioeconomic History   Marital status: Single    Spouse  name: Not on file   Number of children: Not on file   Years of education: Not on file   Highest education level: Not on file  Occupational History   Not on file  Tobacco Use   Smoking status: Former    Packs/day: 0.10    Years: 5.00    Total pack years: 0.50    Types: Cigarettes    Quit date: 2022    Years since quitting: 1.9    Passive exposure: Past   Smokeless tobacco: Never   Tobacco comments:    occasional  Vaping Use   Vaping Use: Some days  Substance and Sexual Activity   Alcohol use: Not Currently   Drug use: Not Currently    Types: Marijuana    Comment: last use approx 12/2020   Sexual activity: Yes  Other Topics Concern   Not on file  Social History Narrative   Not on file   Social Determinants of Health   Financial Resource Strain: Not on file  Food Insecurity: Not on file  Transportation Needs: Not on file  Physical Activity: Not on file  Stress: Not on file  Social Connections: Not on file  Intimate Partner Violence: Not on file    Review of Systems:  All other review of systems negative except as mentioned in the HPI.  Physical Exam: Vital signs BP 112/65   Pulse 64   Temp 98 F (36.7 C) (Temporal)   Ht 5\' 2"  (1.575 m)   Wt 160 lb (72.6 kg)   SpO2 100%   BMI 29.26 kg/m   General:   Alert,  Well-developed, well-nourished, pleasant and cooperative in NAD Airway:  Mallampati 2 Lungs:  Clear throughout to auscultation.   Heart:  Regular rate and rhythm; no murmurs, clicks, rubs,  or gallops. Abdomen:  Soft, nontender and nondistended. Normal bowel sounds.   Neuro/Psych:  Normal mood and affect. A and O x 3   Johnathon Olden E. , MD [facility]

## 2022-06-07 ENCOUNTER — Telehealth: Payer: Self-pay | Admitting: *Deleted

## 2022-06-07 ENCOUNTER — Telehealth: Payer: Self-pay

## 2022-06-07 NOTE — Telephone Encounter (Signed)
Patient returned call stating that she has had an upset stomach for the last few days and has also had some pain around her belly button and is requesting a call back to discuss. Please advsie.

## 2022-06-07 NOTE — Telephone Encounter (Signed)
Called the patient back to address her c/o abdominal discomfort around her "belly button." This pain is slowly resolving and I reassured her this is common post coloscopy. No fever, she is having bowel movement and eating and drinking fine. Encouraged her to let us know if the pain, fever, bleeding occurs.

## 2022-06-07 NOTE — Telephone Encounter (Signed)
Attempted to reach pt. With follow-up call following endoscopic procedure 06/04/2022.  LM On pt. Voice mail to call if she has any questions or concerns.

## 2022-06-14 NOTE — Progress Notes (Signed)
Ann Clark, The biopsies of your duodenum were normal.  There is no evidence of celiac disease. The biopsies of your stomach showed mild chronic gastritis, but no evidence of H. pylori infection.  Gastritis is very common and most often related to medications such as NSAIDs (such as naproxen). Please follow-up with [place] in the clinic to further discuss management of your chronic GI symptoms.  You will be due for your next colonoscopy in 10 years for colon cancer screening.

## 2022-07-15 IMAGING — CT CT HEAD W/O CM
3 series · 16 of 47 positions shown, 19 images · non-contrast
Comparison: No pertinent prior exams available for comparison.

CLINICAL DATA: Headache, chronic, new features or increased
frequency. Headache for 2-3 weeks, increasing.

EXAM:
CT HEAD WITHOUT CONTRAST
TECHNIQUE: Contiguous axial images were obtained from the base of the skull
through the vertex without intravenous contrast.

[Series 2: head wo · axial · 0.46mm/px · z∈[+1374,+1519]mm · 10 of 35 slices shown, 13 images]
[im 3/35  brain]
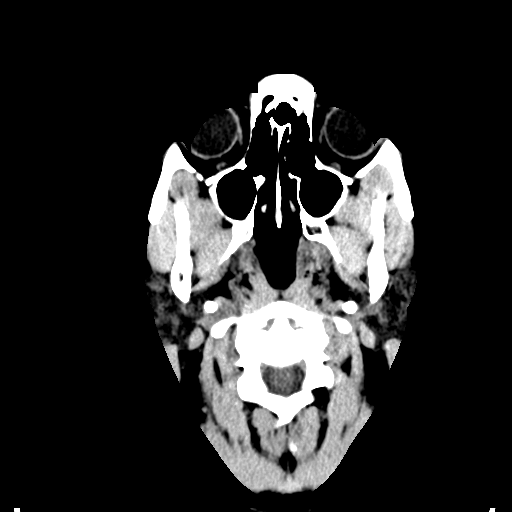
[im 3/35  bone]
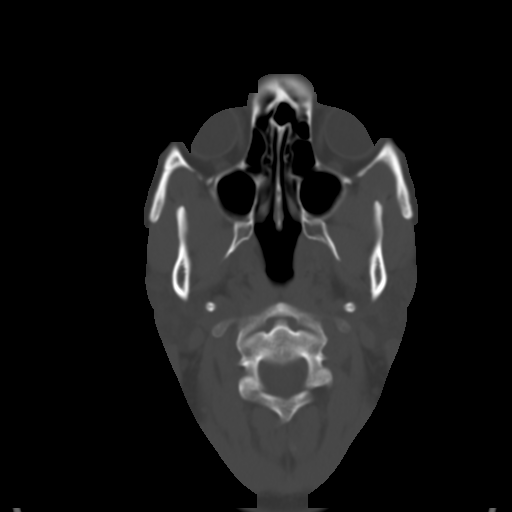
[im 6/35  brain]
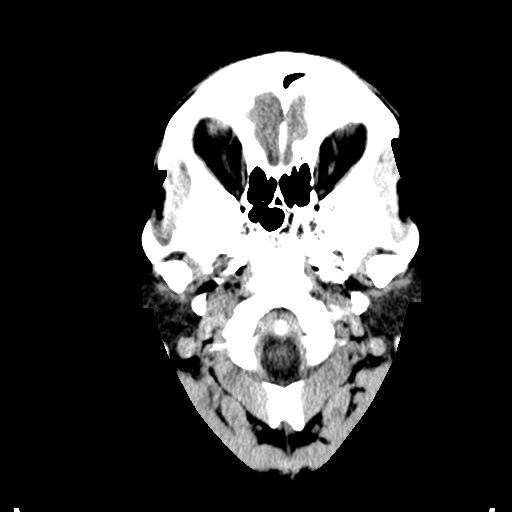
[im 10/35  brain]
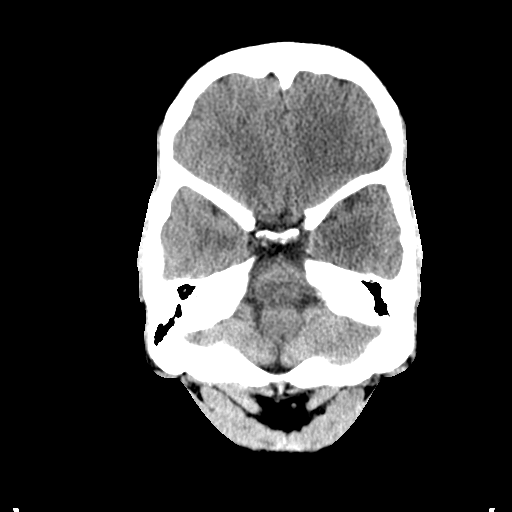
[im 12/35  brain]
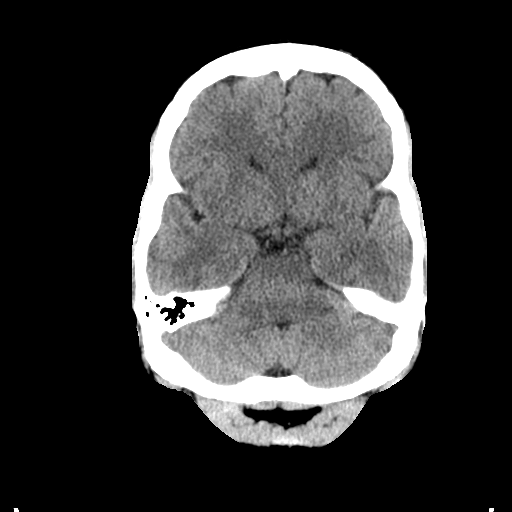
[im 16/35  brain]
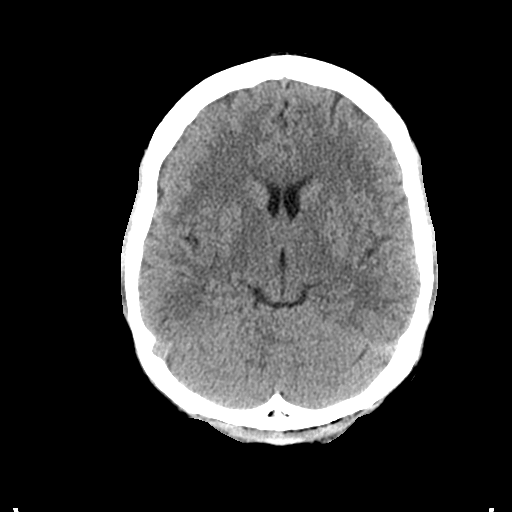
[im 16/35  bone]
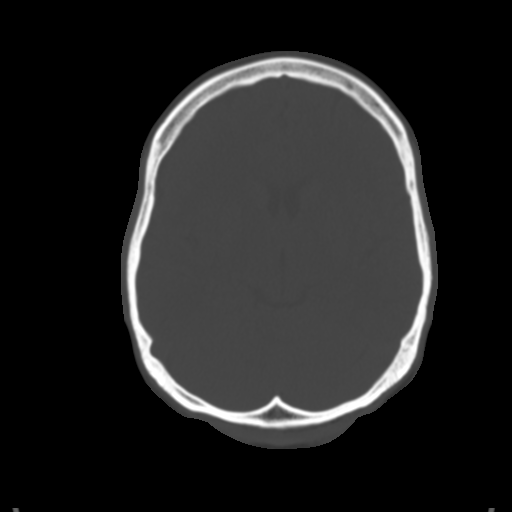
[im 19/35  brain]
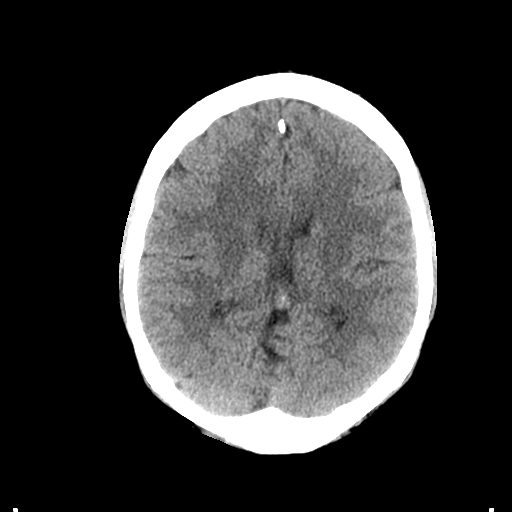
[im 23/35  brain]
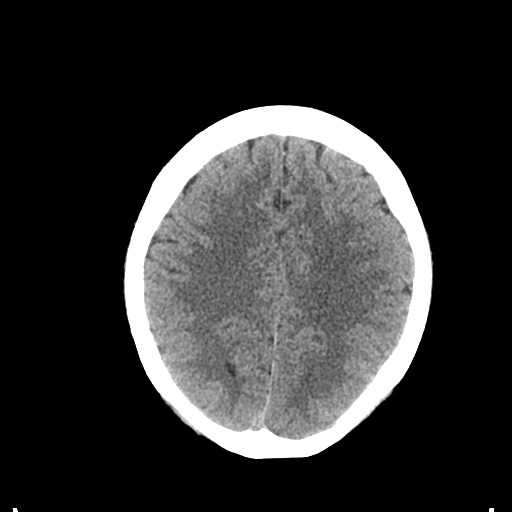
[im 26/35  brain]
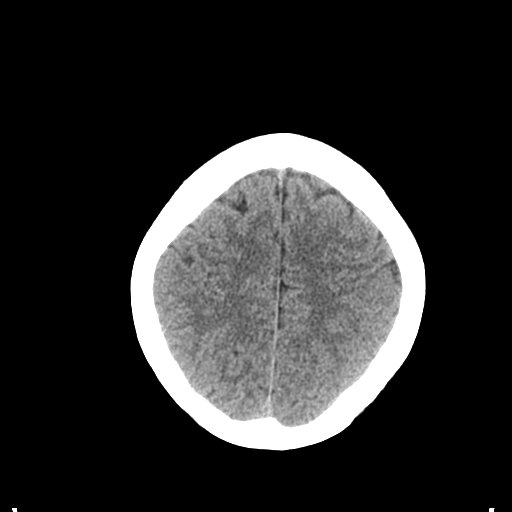
[im 29/35  brain]
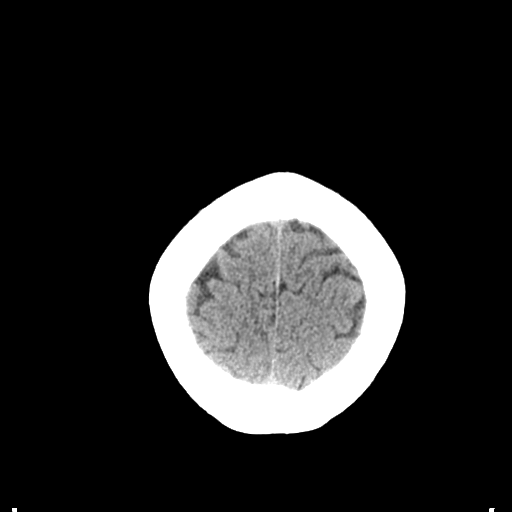
[im 29/35  bone]
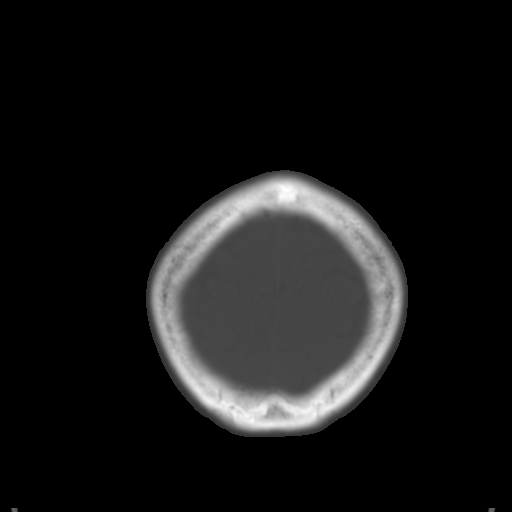
[im 32/35  brain]
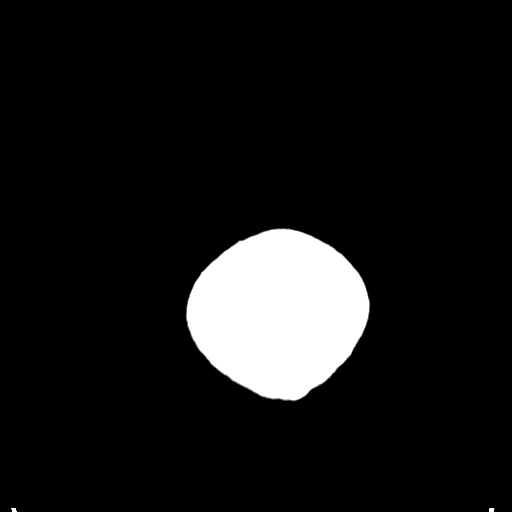

[Series 4: coronal soft tissue · coronal · 0.32mm/px · 3 of 73 slices shown]
[im 25/73  brain]
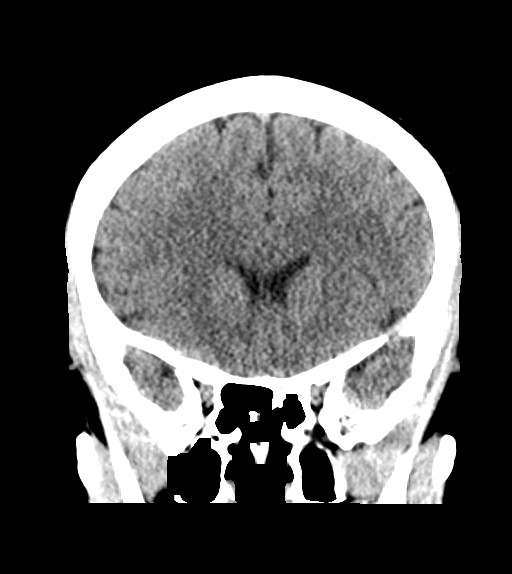
[im 33/73  brain]
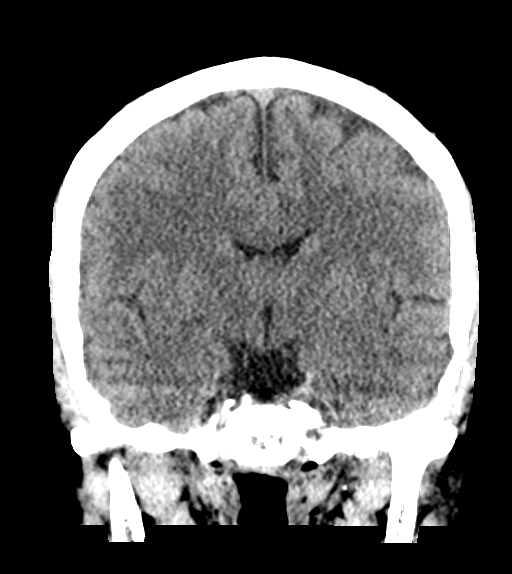
[im 41/73  brain]
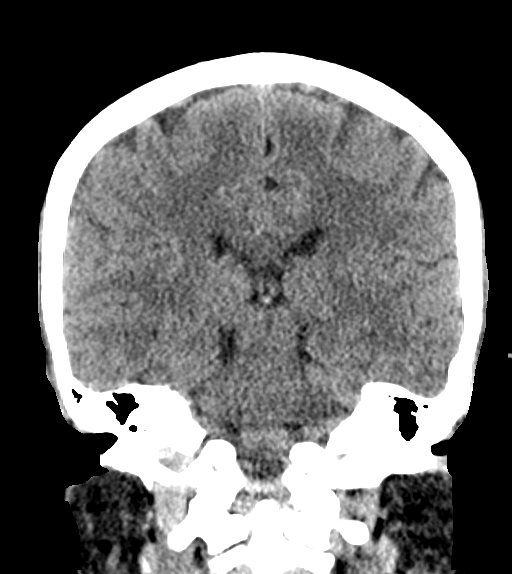

[Series 5: sagittal soft tissue · sagittal · 0.37mm/px · 3 of 57 slices shown]
[im 19/57  brain]
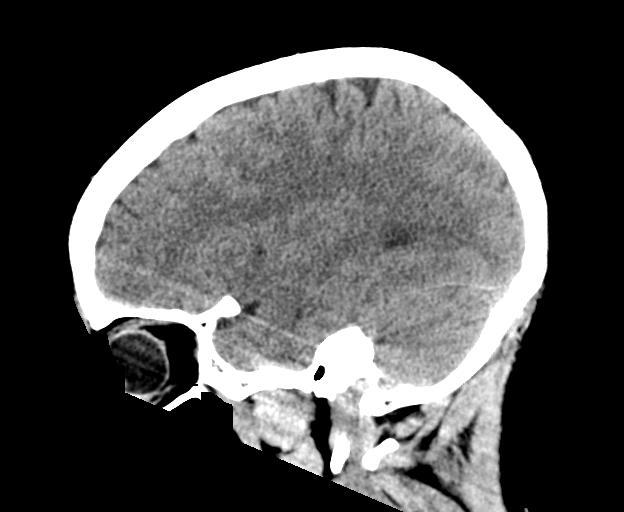
[im 29/57  brain]
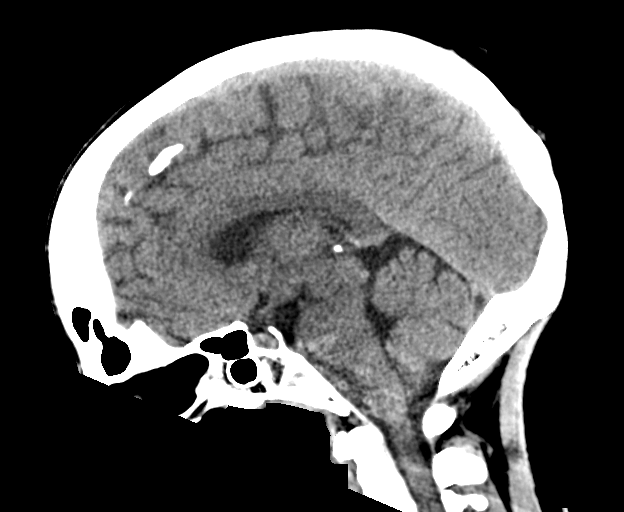
[im 38/57  brain]
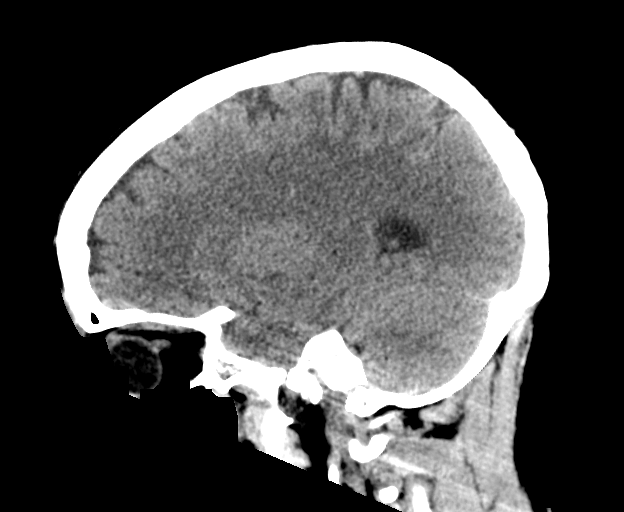

[16 of 47 positions shown; findings below may reference images not displayed]

FINDINGS: Brain:

Cerebral volume is normal.

There is no acute intracranial hemorrhage.

No demarcated cortical infarct.

No extra-axial fluid collection.

No evidence of intracranial mass.

No midline shift.

Vascular: No hyperdense vessel.

Skull: Normal. Negative for fracture or focal lesion.

Sinuses/Orbits: Visualized orbits show no acute finding. No
significant paranasal sinus disease at the imaged levels.
IMPRESSION: Unremarkable noncontrast CT appearance of the brain. No evidence of
acute intracranial abnormality.

## 2022-07-23 NOTE — Pre-Procedure Instructions (Signed)
Surgical Instructions    Your procedure is scheduled on Thursday, February 1st.  Report to [facility] Main Entrance "A" at 05:30 A.M., then check in with the Admitting office.  Call this number if you have problems the morning of surgery:  [phone]  If you have any questions prior to your surgery date call [phone]: Open Monday-Friday 8am-4pm If you experience any cold or flu symptoms such as cough, fever, chills, shortness of breath, etc. between now and your scheduled surgery, please notify us at the above number.     Remember:  Do not eat after midnight the night before your surgery  You may drink clear liquids until 04:30 AM the morning of your surgery.   Clear liquids allowed are: Water, Non-Citrus Juices (without pulp), Carbonated Beverages, Clear Tea, Black Coffee Only (NO MILK, CREAM OR POWDERED CREAMER of any kind), and Gatorade.   Patient Instructions  The night before surgery:  No food after midnight. ONLY clear liquids after midnight  The day of surgery (if you do NOT have diabetes):  Drink ONE (1) Pre-Surgery Clear Ensure by 04:30 AM the morning of surgery. Drink in one sitting. Do not sip.  This drink was given to you during your hospital  pre-op appointment visit.  Nothing else to drink after completing the  Pre-Surgery Clear Ensure.          If you have questions, please contact your surgeon's office.     Take these medicines the morning of surgery with A SIP OF WATER  cetirizine (ZYRTEC)  omeprazole (PRILOSEC)   If needed: Acetaminophen (TYLENOL PO)   As of today, STOP taking any Aspirin (unless otherwise instructed by your surgeon) Aleve, Naproxen, Ibuprofen, Motrin, Advil, Goody's, BC's, all herbal medications, fish oil, and all vitamins. This includes celecoxib (CELEBREX) and diclofenac Sodium (VOLTAREN) 1 % GEL.                     Do NOT Smoke (Tobacco/Vaping) for 24 hours prior to your procedure.  If you use a CPAP at night, you may bring  your mask/headgear for your overnight stay.   Contacts, glasses, piercing's, hearing aid's, dentures or partials may not be worn into surgery, please bring cases for these belongings.    For patients admitted to the hospital, discharge time will be determined by your treatment team.   Patients discharged the day of surgery will not be allowed to drive home, and someone needs to stay with them for 24 hours.  SURGICAL WAITING ROOM VISITATION Patients having surgery or a procedure may have no more than 2 support people in the waiting area - these visitors may rotate.   Children under the age of 27 must have an adult with them who is not the patient. If the patient needs to stay at the hospital during part of their recovery, the visitor guidelines for inpatient rooms apply. Pre-op nurse will coordinate an appropriate time for 1 support person to accompany patient in pre-op.  This support person may not rotate.   Please refer to the [facility] website for the visitor guidelines for Inpatients (after your surgery is over and you are in a regular room).    Special instructions:   [place]- Preparing For Surgery  Before surgery, you can play an important role. Because skin is not sterile, your skin needs to be as free of germs as possible. You can reduce the number of germs on your skin by washing with CHG (chlorahexidine gluconate) Soap  before surgery.  CHG is an antiseptic cleaner which kills germs and bonds with the skin to continue killing germs even after washing.    Oral Hygiene is also important to reduce your risk of infection.  Remember - BRUSH YOUR TEETH THE MORNING OF SURGERY WITH YOUR REGULAR TOOTHPASTE  Please do not use if you have an allergy to CHG or antibacterial soaps. If your skin becomes reddened/irritated stop using the CHG.  Do not shave (including legs and underarms) for at least 48 hours prior to first CHG shower. It is OK to shave your face.  Please follow these  instructions carefully.   Shower the NIGHT BEFORE SURGERY and the MORNING OF SURGERY  If you chose to wash your hair, wash your hair first as usual with your normal shampoo.  After you shampoo, rinse your hair and body thoroughly to remove the shampoo.  Use CHG Soap as you would any other liquid soap. You can apply CHG directly to the skin and wash gently with a scrungie or a clean washcloth.   Apply the CHG Soap to your body ONLY FROM THE NECK DOWN.  Do not use on open wounds or open sores. Avoid contact with your eyes, ears, mouth and genitals (private parts). Wash Face and genitals (private parts)  with your normal soap.   Wash thoroughly, paying special attention to the area where your surgery will be performed.  Thoroughly rinse your body with warm water from the neck down.  DO NOT shower/wash with your normal soap after using and rinsing off the CHG Soap.  Pat yourself dry with a CLEAN TOWEL.  Wear CLEAN PAJAMAS to bed the night before surgery  Place CLEAN SHEETS on your bed the night before your surgery  DO NOT SLEEP WITH PETS.   Day of Surgery: Take a shower with CHG soap. Do not wear jewelry or makeup Do not wear lotions, powders, perfumes, or deodorant. Do not shave 48 hours prior to surgery.  Do not bring valuables to the hospital. [facility] is not responsible for any belongings or valuables. Do not wear nail polish, gel polish, artificial nails, or any other type of covering on natural nails (fingers and toes) If you have artificial nails or gel coating that need to be removed by a nail salon, please have this removed prior to surgery. Artificial nails or gel coating may interfere with anesthesia's ability to adequately monitor your vital signs. Wear Clean/Comfortable clothing the morning of surgery Remember to brush your teeth WITH YOUR REGULAR TOOTHPASTE.   Please read over the following fact sheets that you were given.    If you received a COVID test during  your pre-op visit  it is requested that you wear a mask when out in public, stay away from anyone that may not be feeling well and notify your surgeon if you develop symptoms. If you have been in contact with anyone that has tested positive in the last 10 days please notify you surgeon.

## 2022-07-26 ENCOUNTER — Inpatient Hospital Stay (HOSPITAL_COMMUNITY)
Admission: RE | Admit: 2022-07-26 | Discharge: 2022-07-26 | Disposition: A | Discharge status: Home / Self Care | Payer: Medicaid Other | Source: Ambulatory Visit

## 2022-07-26 NOTE — Progress Notes (Signed)
Called pt to see if she was coming to her PAT appt. She stated that she went out of town "for an emergency." I asked if she'd like to try to reschedule or just go over her history on the phone. The patient said she is in the middle of something and she will call back. I tried to give the pt the number to return my phone call and she hung up the phone.

## 2022-07-28 ENCOUNTER — Encounter (HOSPITAL_COMMUNITY): Payer: Self-pay | Admitting: Anesthesiology

## 2022-07-28 ENCOUNTER — Encounter (HOSPITAL_COMMUNITY): Payer: Self-pay | Admitting: Orthopedic Surgery

## 2022-07-28 NOTE — Progress Notes (Signed)
Patient states she is cancelling this procedure. Informed her to call MD's office to let them know her desire to cancel the surgery.  I called MD's office and spoke with Malachy Mood to inform her of cancellation of surgery per patient.

## 2022-07-28 NOTE — Anesthesia Preprocedure Evaluation (Signed)
Anesthesia Evaluation    Airway        Dental   Pulmonary former smoker          Cardiovascular negative cardio ROS      Neuro/Psych  PSYCHIATRIC DISORDERS Anxiety  Bipolar Disorder   negative neurological ROS     GI/Hepatic Neg liver ROS,GERD  Medicated,,  Endo/Other    Renal/GU negative Renal ROS  negative genitourinary   Musculoskeletal Right shoulder labral tear Right shoulder instability   Abdominal   Peds  Hematology  (+) Blood dyscrasia, anemia ANA +   Anesthesia Other Findings   Reproductive/Obstetrics                              Anesthesia Physical Anesthesia Plan  ASA: 2  Anesthesia Plan: General   Post-op Pain Management: Minimal or no pain anticipated and Regional block*   Induction: Intravenous  PONV Risk Score and Plan: 4 or greater and Treatment may vary due to age or medical condition and Ondansetron  Airway Management Planned: Oral ETT  Additional Equipment: None  Intra-op Plan:   Post-operative Plan: Extubation in OR  Informed Consent:   Plan Discussed with:   Anesthesia Plan Comments:          Anesthesia Quick Evaluation

## 2022-07-29 ENCOUNTER — Ambulatory Visit (HOSPITAL_COMMUNITY): Admission: RE | Admit: 2022-07-29 | Payer: Medicaid Other | Source: Home / Self Care | Admitting: Orthopedic Surgery

## 2022-07-29 DIAGNOSIS — Z01818 Encounter for other preprocedural examination: Secondary | ICD-10-CM

## 2022-07-29 SURGERY — ARTHROSCOPY, SHOULDER, WITH GLENOID LABRUM REPAIR
Anesthesia: General | Site: Shoulder | Laterality: Right

## 2022-08-03 ENCOUNTER — Other Ambulatory Visit: Payer: Self-pay | Admitting: Internal Medicine

## 2022-08-03 ENCOUNTER — Telehealth: Payer: Self-pay | Admitting: Surgical

## 2022-08-03 DIAGNOSIS — M2550 Pain in unspecified joint: Secondary | ICD-10-CM

## 2022-08-03 NOTE — Telephone Encounter (Signed)
Next Visit: Return if symptoms worsen or fail to improve.   Last Visit: 05/25/2022  Last Fill: 05/25/2022  DX: Arthralgia, unspecified joint   Current Dose per office note on 05/25/2022: Previously had some relief with NSAIDs but had GI intolerance recommend trial of 100 mg Celebrex to see if this may be better tolerated as a treatment to mitigate symptoms while undergoing workup or definitive management.   Labs: 03/29/2022 hemoglobin 11.5, HCT 35.6, MCV 74.7  Okay to refill celebrex?   Would you like patient to follow up with you or get this refill from PCP?

## 2022-08-03 NOTE — Telephone Encounter (Signed)
Next Visit: not on file  Last Visit: 05/25/2022  Last Fill: 05/25/2022  DX:  Positive ANA (antinuclear antibody)   Current Dose per office note 05/25/2022: Celebrex Take 1 capsule (100 mg total) by mouth 2 (two) times daily as needed for moderate pain.   Labs: 03/29/2022 Hgb 11.5, Hct 35.6, MCV 74.7  Okay to refill Celebrex? When should patient follow up?

## 2022-08-03 NOTE — Telephone Encounter (Signed)
Called patient left message on voicemail advised appointment was canceled due to her not having surgery.

## 2022-08-04 MED ORDER — CELECOXIB 100 MG PO CAPS: 100.0000 mg | ORAL_CAPSULE | Freq: Two times a day (BID) | ORAL | 0 refills | Status: DC | PRN

## 2022-08-04 NOTE — Telephone Encounter (Signed)
Rheumatology workup from initial visit was negative.  She needs to follow-up with her orthopedic surgeon Dr. Marlou Sa for the right wrist as well as shoulder problems. I'm okay to refill the celebrex for now if that is helpful but she doesn't need to keep seeing [place] long term just for that.

## 2022-08-06 ENCOUNTER — Encounter: Payer: Medicaid Other | Admitting: Surgical

## 2022-09-07 NOTE — Progress Notes (Unsigned)
Patient ID: Ann Clark, female    DOB: 10-May-1986  MRN: [id number]  CC: Annual Physical Exam  Subjective: Ann Clark is a 37 y.o. female who presents for annual physical exam.   Her concerns today include:  - Bilateral hands and feet pain/discomfort persisting. She denies red flag symptoms. States she was seen by Rheumatology and "everything ok". She would like to try Ibuprofen for pain management as alternative to Celebrex.  - Reports thinks she caught bronchitis from her manager at work. Endorses productive cough. She denies associated red flag symptoms.  - Routine STD screening. She denies symptoms. - Taking Colace as needed for constipation. She prefers natural measures such as increasing water intake, vegetables, and fruit.  - Requests vitamin D screening.  - No further issues/concerns for discussion today.   Patient Active Problem List   Diagnosis Date Noted   Positive ANA (antinuclear antibody) 05/25/2022   Insomnia 06/25/2020   Acute cystitis without hematuria 06/11/2020   Bacterial vaginitis 06/11/2020   Anxiety state 06/11/2020   H. pylori infection 02/27/2020   Vitamin D deficiency 02/27/2020   Myalgia 07/30/2015   GERD (gastroesophageal reflux disease) 07/30/2015   Arthralgia 07/16/2015   Bipolar I disorder ([place]) 07/16/2015     Current Outpatient Medications on File Prior to Visit  Medication Sig Dispense Refill   Acetaminophen (TYLENOL PO) Take by mouth.     cetirizine (ZYRTEC) 10 MG chewable tablet Chew 10 mg by mouth daily.     diclofenac Sodium (VOLTAREN) 1 % GEL Apply 4 g topically 4 (four) times daily. 350 g 0   docusate sodium (COLACE) 100 MG capsule Take 1 capsule (100 mg total) by mouth 2 (two) times daily as needed for mild constipation. 60 capsule 2   omeprazole (PRILOSEC) 40 MG capsule Take 1 capsule (40 mg total) by mouth daily. 180 capsule 0   No current facility-administered medications on file prior to visit.    No Known  Allergies  Social History   Socioeconomic History   Marital status: Single    Spouse name: Not on file   Number of children: Not on file   Years of education: Not on file   Highest education level: Not on file  Occupational History   Not on file  Tobacco Use   Smoking status: Former    Packs/day: 0.10    Years: 5.00    Total pack years: 0.50    Types: Cigarettes    Quit date: 2022    Years since quitting: 2.1    Passive exposure: Past   Smokeless tobacco: Never   Tobacco comments:    occasional  Vaping Use   Vaping Use: Some days  Substance and Sexual Activity   Alcohol use: Not Currently   Drug use: Not Currently    Types: Marijuana    Comment: last use approx 12/2020   Sexual activity: Yes  Other Topics Concern   Not on file  Social History Narrative   Not on file   Social Determinants of Health   Financial Resource Strain: Not on file  Food Insecurity: Not on file  Transportation Needs: Not on file  Physical Activity: Not on file  Stress: Not on file  Social Connections: Not on file  Intimate Partner Violence: Not on file    Family History  Problem Relation Age of Onset   COPD Mother    Diabetes Mother    Hyperlipidemia Mother    Hypertension Mother    Anxiety disorder  Mother    Arthritis Mother    Asthma Mother    Depression Mother    Drug abuse Mother    Vision loss Mother    Kidney disease Father    Drug abuse Father    Heart Problems Father    ADD / ADHD Sister    Heart disease Sister    Clotting disorder Sister    Hearing loss Son     Past Surgical History:  Procedure Laterality Date   DILATION AND EVACUATION  01/22/2005   EUSTACHIAN TUBE DILATION     TONSILLECTOMY      ROS: Review of Systems Negative except as stated above  PHYSICAL EXAM: BP 111/77 (BP Location: Left Arm, Patient Position: Sitting, Cuff Size: Large)   Pulse 78   Temp 98.3 F (36.8 C)   Resp 16   Ht 5' 2.01" (1.575 m)   Wt 157 lb (71.2 kg)   SpO2 98%    BMI 28.71 kg/m   Physical Exam HENT:     Head: Normocephalic and atraumatic.     Right Ear: Tympanic membrane, ear canal and external ear normal.     Left Ear: Tympanic membrane, ear canal and external ear normal.     Nose: Nose normal.     Mouth/Throat:     Mouth: Mucous membranes are moist.     Pharynx: Oropharynx is clear.  Eyes:     Extraocular Movements: Extraocular movements intact.     Conjunctiva/sclera: Conjunctivae normal.     Pupils: Pupils are equal, round, and reactive to light.  Cardiovascular:     Rate and Rhythm: Normal rate and regular rhythm.     Pulses: Normal pulses.     Heart sounds: Normal heart sounds.  Pulmonary:     Effort: Pulmonary effort is normal.     Breath sounds: Normal breath sounds.  Chest:  Breasts:    Right: Normal.     Left: Normal.     Comments: Elmon Else, CMA present. Abdominal:     General: Bowel sounds are normal.     Palpations: Abdomen is soft.  Musculoskeletal:        General: Normal range of motion.     Right shoulder: Normal.     Left shoulder: Normal.     Right upper arm: Normal.     Left upper arm: Normal.     Right elbow: Normal.     Left elbow: Normal.     Right forearm: Normal.     Left forearm: Normal.     Right wrist: Normal.     Left wrist: Normal.     Right hand: Normal.     Left hand: Normal.     Cervical back: Normal, normal range of motion and neck supple.     Thoracic back: Normal.     Lumbar back: Normal.     Right hip: Normal.     Left hip: Normal.     Right upper leg: Normal.     Left upper leg: Normal.     Right knee: Normal.     Left knee: Normal.     Right lower leg: Normal.     Left lower leg: Normal.     Right ankle: Normal.     Left ankle: Normal.     Right foot: Normal.     Left foot: Normal.  Skin:    General: Skin is warm and dry.     Capillary Refill: Capillary refill takes less than 2 seconds.  Neurological:     General: No focal deficit present.     Mental Status: She is  alert and oriented to person, place, and time.  Psychiatric:        Mood and Affect: Mood normal.        Behavior: Behavior normal.     ASSESSMENT AND PLAN: 1. Annual physical exam - Counseled on 150 minutes of exercise per week as tolerated, healthy eating (including decreased daily intake of saturated fats, cholesterol, added sugars, sodium), STI prevention, and routine healthcare maintenance.  2. Screening for metabolic disorder - Routine screening.  - CMP14+EGFR  3. Diabetes mellitus screening - Routine screening.  - Hemoglobin A1c  4. Screening cholesterol level - Routine screening.  - Lipid panel  5. Routine screening for STI (sexually transmitted infection) - Routine screening.  - Cervicovaginal ancillary only  6. Bronchitis - Patient today in office with no cardiopulmonary distress.  - Azithromycin as prescribed. Counseled on medication adherence.  - Follow-up with primary provider as scheduled.  - azithromycin (ZITHROMAX) 500 MG tablet; Take 1 tablet (500 mg total) by mouth daily for 3 days.  Dispense: 3 tablet; Refill: 0  7. Bilateral hand pain 8. Pain in both feet - Ibuprofen as prescribed. Counseled on medication adherence.  - Referral to Neurology for further evaluation/management.  - Ambulatory referral to Neurology - ibuprofen (ADVIL) 800 MG tablet; Take 1 tablet (800 mg total) by mouth every 8 (eight) hours as needed.  Dispense: 30 tablet; Refill: 1  9. Vitamin D deficiency - Routine screening.  - VITAMIN D 25 Hydroxy (Vit-D Deficiency, Fractures)   Patient was given the opportunity to ask questions.  Patient verbalized understanding of the plan and was able to repeat key elements of the plan. Patient was given clear instructions to go to Emergency Department or return to medical center if symptoms don't improve, worsen, or new problems develop.The patient verbalized understanding.   Orders Placed This Encounter  Procedures   Hemoglobin A1c   Lipid  panel   CMP14+EGFR   VITAMIN D 25 Hydroxy (Vit-D Deficiency, Fractures)   Ambulatory referral to Neurology     Requested Prescriptions   Signed Prescriptions Disp Refills   azithromycin (ZITHROMAX) 500 MG tablet 3 tablet 0    Sig: Take 1 tablet (500 mg total) by mouth daily for 3 days.   ibuprofen (ADVIL) 800 MG tablet 30 tablet 1    Sig: Take 1 tablet (800 mg total) by mouth every 8 (eight) hours as needed.    Return in about 1 year (around 09/08/2023) for Physical per patient preference.  [name] Herter, NP

## 2022-09-08 ENCOUNTER — Encounter: Payer: Self-pay | Admitting: Family

## 2022-09-08 ENCOUNTER — Ambulatory Visit (INDEPENDENT_AMBULATORY_CARE_PROVIDER_SITE_OTHER): Payer: Medicaid Other | Admitting: Family

## 2022-09-08 ENCOUNTER — Other Ambulatory Visit (HOSPITAL_COMMUNITY)
Admission: RE | Admit: 2022-09-08 | Discharge: 2022-09-08 | Disposition: A | Discharge status: Home / Self Care | Payer: Medicaid Other | Source: Ambulatory Visit | Attending: Family | Admitting: Family

## 2022-09-08 VITALS — BP 111/77 | HR 78 | Temp 98.3°F | Resp 16 | Ht 62.01 in | Wt 157.0 lb

## 2022-09-08 DIAGNOSIS — M79671 Pain in right foot: Secondary | ICD-10-CM

## 2022-09-08 DIAGNOSIS — M79642 Pain in left hand: Secondary | ICD-10-CM

## 2022-09-08 DIAGNOSIS — J40 Bronchitis, not specified as acute or chronic: Secondary | ICD-10-CM | POA: Diagnosis not present

## 2022-09-08 DIAGNOSIS — Z131 Encounter for screening for diabetes mellitus: Secondary | ICD-10-CM | POA: Diagnosis not present

## 2022-09-08 DIAGNOSIS — Z113 Encounter for screening for infections with a predominantly sexual mode of transmission: Secondary | ICD-10-CM | POA: Diagnosis present

## 2022-09-08 DIAGNOSIS — Z13228 Encounter for screening for other metabolic disorders: Secondary | ICD-10-CM

## 2022-09-08 DIAGNOSIS — M79672 Pain in left foot: Secondary | ICD-10-CM | POA: Diagnosis not present

## 2022-09-08 DIAGNOSIS — Z0001 Encounter for general adult medical examination with abnormal findings: Secondary | ICD-10-CM | POA: Diagnosis not present

## 2022-09-08 DIAGNOSIS — E559 Vitamin D deficiency, unspecified: Secondary | ICD-10-CM | POA: Diagnosis not present

## 2022-09-08 DIAGNOSIS — Z13220 Encounter for screening for lipoid disorders: Secondary | ICD-10-CM

## 2022-09-08 DIAGNOSIS — M79641 Pain in right hand: Secondary | ICD-10-CM

## 2022-09-08 DIAGNOSIS — Z0000 Encounter for general adult medical examination without abnormal findings: Secondary | ICD-10-CM

## 2022-09-08 MED ORDER — IBUPROFEN 800 MG PO TABS: 800.0000 mg | ORAL_TABLET | Freq: Three times a day (TID) | ORAL | 1 refills | Status: AC | PRN

## 2022-09-08 MED ORDER — AZITHROMYCIN 500 MG PO TABS: 500.0000 mg | ORAL_TABLET | Freq: Every day | ORAL | 0 refills | Status: AC

## 2022-09-08 NOTE — Progress Notes (Signed)
.  Pt presents for annual physical exam  -having a cough on and off

## 2022-09-08 NOTE — Patient Instructions (Signed)

## 2022-09-09 LAB — CMP14+EGFR
ALT: 12 IU/L (ref 0–32)
AST: 18 IU/L (ref 0–40)
Albumin/Globulin Ratio: 1.5 (ref 1.2–2.2)
Albumin: 4 g/dL (ref 3.9–4.9)
Alkaline Phosphatase: 53 IU/L (ref 44–121)
BUN/Creatinine Ratio: 10 (ref 9–23)
BUN: 9 mg/dL (ref 6–20)
Bilirubin Total: <0.2 mg/dL (ref 0.0–1.2)
CO2: 20 mmol/L (ref 20–29)
Calcium: 8.8 mg/dL (ref 8.7–10.2)
Chloride: 104 mmol/L (ref 96–106)
Creatinine, Ser: 0.92 mg/dL (ref 0.57–1.00)
Globulin, Total: 2.7 g/dL (ref 1.5–4.5)
Glucose: 82 mg/dL (ref 70–99)
Potassium: 4.4 mmol/L (ref 3.5–5.2)
Sodium: 136 mmol/L (ref 134–144)
Total Protein: 6.7 g/dL (ref 6.0–8.5)
eGFR: 82 mL/min/{1.73_m2} (ref >59)

## 2022-09-09 LAB — LIPID PANEL
Chol/HDL Ratio: 3.1 ratio (ref 0.0–4.4)
Cholesterol, Total: 162 mg/dL (ref 100–199)
HDL: 52 mg/dL (ref >39)
LDL Chol Calc (NIH): 100 mg/dL — ABNORMAL HIGH (ref 0–99)
Triglycerides: 50 mg/dL (ref 0–149)
VLDL Cholesterol Cal: 10 mg/dL (ref 5–40)

## 2022-09-09 LAB — HEMOGLOBIN A1C
Est. average glucose Bld gHb Est-mCnc: 105 mg/dL
Hgb A1c MFr Bld: 5.3 % (ref 4.8–5.6)

## 2022-09-13 ENCOUNTER — Encounter: Payer: Self-pay | Admitting: Family

## 2022-09-13 LAB — CERVICOVAGINAL ANCILLARY ONLY
Bacterial Vaginitis (gardnerella): POSITIVE — AB
Candida Glabrata: NEGATIVE
Candida Vaginitis: NEGATIVE
Chlamydia: NEGATIVE
Comment: NEGATIVE
Comment: NEGATIVE
Comment: NEGATIVE
Comment: NEGATIVE
Comment: NEGATIVE
Comment: NORMAL
Neisseria Gonorrhea: NEGATIVE
Trichomonas: NEGATIVE

## 2022-09-14 ENCOUNTER — Other Ambulatory Visit: Payer: Self-pay | Admitting: Family

## 2022-09-14 ENCOUNTER — Encounter: Payer: Self-pay | Admitting: Neurology

## 2022-09-14 DIAGNOSIS — B9689 Other specified bacterial agents as the cause of diseases classified elsewhere: Secondary | ICD-10-CM

## 2022-09-14 MED ORDER — METRONIDAZOLE 500 MG PO TABS: 500.0000 mg | ORAL_TABLET | Freq: Two times a day (BID) | ORAL | 0 refills | Status: DC

## 2022-09-14 MED ORDER — METRONIDAZOLE 0.75 % VA GEL: 1.0000 | Freq: Every day | VAGINAL | 0 refills | Status: AC

## 2022-09-14 NOTE — Telephone Encounter (Signed)
Order complete. 

## 2022-09-16 NOTE — Progress Notes (Signed)
Initial neurology clinic note  SERVICE DATE: 09/23/22  Reason for Evaluation: Consultation requested by [name] Herter, NP for an opinion regarding pain in feet and hands. My final recommendations will be communicated back to the requesting physician by way of shared medical record or letter to requesting physician via [place] mail.  HPI: This is [name], a 37 y.o. right-handed female with a medical history of vit D deficiency, bipolar I, insomnia, anxiety, former smoker who presents to neurology clinic with the chief complaint of pain in feet and hands. The patient is alone today.  Patient has had symptoms for a couple of years. The pain started in her hands. They were "locking up". They will hurt when she is trying to use them. She can't write or hold things too long or they will lock up. She has to rub her hands a lot due to pain. Sometimes the pain is pins and needles, sometimes numbness. She has symptoms in her feet as well. She describes it as achiness, pins and needles, or numbness.   Patient had an EMG in 03/2022 of the RUE that was normal.   Patient was seen by rheumatology due to symptoms and positive ANA. There was not a clear rheumatologic process per documentation. She has dry eyes and mouth, but thinks this is normal.  Patient has right shoulder problems. 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 She was supposed to get surgery last month, but she "chickened out".  At some point, patient was taking celebrex that helped with symptoms. She has been off the celebrex for 2 months and now thinks the pain is getting worse. She is currently on tylenol. This helps some, but not for long. Voltaren gel was working and she just recently restarted it. She feels like the celebrex helped the most. She was not given refills.  She thinks her symptoms are because of a circulation problem.  The patient has not noticed any recent skin rashes nor does she report any constitutional  symptoms like fever, anorexia or unintentional weight loss. She does endorse night sweats over the last couple of years.  EtOH use: very rare  Restrictive diet? No, but not eating as well lately Family history of neuropathy/myopathy/neurologic disease? No sure   MEDICATIONS:  Outpatient Encounter Medications as of 09/23/2022  Medication Sig   Acetaminophen (TYLENOL PO) Take by mouth as needed.   cetirizine (ZYRTEC) 10 MG chewable tablet Chew 10 mg by mouth as needed.   diclofenac Sodium (VOLTAREN) 1 % GEL Apply 4 g topically 4 (four) times daily.   docusate sodium (COLACE) 100 MG capsule Take 1 capsule (100 mg total) by mouth 2 (two) times daily as needed for mild constipation.   omeprazole (PRILOSEC) 40 MG capsule Take 1 capsule (40 mg total) by mouth daily.   ibuprofen (ADVIL) 800 MG tablet Take 1 tablet (800 mg total) by mouth every 8 (eight) hours as needed. (Patient not taking: Reported on 09/23/2022)   [EXPIRED] metroNIDAZOLE (METROGEL) 0.75 % vaginal gel Place 1 Applicatorful vaginally at bedtime for 5 days.   [DISCONTINUED] diclofenac Sodium (VOLTAREN) 1 % GEL Apply 4 g topically 4 (four) times daily.   [DISCONTINUED] omeprazole (PRILOSEC) 40 MG capsule Take 1 capsule (40 mg total) by mouth daily.   No facility-administered encounter medications on file as of 09/23/2022.    PAST MEDICAL HISTORY: Past Medical History:  Diagnosis Date   Anemia    Anxiety    Preterm labor  Shoulder pain    Trichomonas infection 08/01/2015    PAST SURGICAL HISTORY: Past Surgical History:  Procedure Laterality Date   DILATION AND EVACUATION  01/22/2005   EUSTACHIAN TUBE DILATION     TONSILLECTOMY      ALLERGIES: No Known Allergies  FAMILY HISTORY: Family History  Problem Relation Age of Onset   COPD Mother    Diabetes Mother    Hyperlipidemia Mother    Hypertension Mother    Anxiety disorder Mother    Arthritis Mother    Asthma Mother    Depression Mother    Drug abuse Mother     Vision loss Mother    Kidney disease Father    Drug abuse Father    Heart Problems Father    ADD / ADHD Sister    Heart disease Sister    Clotting disorder Sister    Hearing loss Son     SOCIAL HISTORY: Social History   Tobacco Use   Smoking status: Former    Packs/day: 0.10    Years: 5.00    Additional pack years: 0.00    Total pack years: 0.50    Types: Cigarettes    Quit date: 2022    Years since quitting: 2.2    Passive exposure: Past   Smokeless tobacco: Never   Tobacco comments:    occasional  Vaping Use   Vaping Use: Some days  Substance Use Topics   Alcohol use: Not Currently   Drug use: Not Currently    Types: Marijuana    Comment: last use approx 12/2020   Social History   Social History Narrative   Are you right handed or left handed? right   Are you currently employed ? no   What is your current occupation?   Do you live at home alone? son   Who lives with you?    What type of home do you live in: 1 story or 2 story? two    Caffeine 1-2 cups daily     OBJECTIVE: PHYSICAL EXAM: BP 127/82   Pulse 88   Ht 5\' 2"  (1.575 m)   Wt 157 lb (71.2 kg)   SpO2 98%   BMI 28.72 kg/m   General: General appearance: Awake and alert. No distress. Cooperative with exam.  Skin: No obvious rash or jaundice. HEENT: Atraumatic. Anicteric. Lungs: Non-labored breathing on room air  Extremities: No edema. No obvious deformity.  Musculoskeletal: No obvious joint swelling. Psych: Affect appropriate. Pressured speech.  Neurological: Mental Status: Alert. Speech fluent. No pseudobulbar affect Cranial Nerves: CNII: No RAPD. Visual fields grossly intact. CNIII, IV, VI: PERRL. No nystagmus. EOMI. CN V: Facial sensation intact bilaterally to fine touch. CN VII: Facial muscles symmetric and strong. No ptosis at rest. CN VIII: Hearing grossly intact bilaterally. CN IX: No hypophonia. CN X: Palate elevates symmetrically. CN XI: Full strength shoulder shrug  bilaterally. CN XII: Tongue protrusion full and midline. No atrophy or fasciculations. No significant dysarthria Motor: Tone is normal. No atrophy. No grip or percussive myotonia. Strength 5/5 in bilateral upper and lower extremities.  Reflexes:  Right Left   Bicep 2+ 2+   Tricep 2+ 2+   BrRad 2+ 2+   Knee 2+ 2+   Ankle 2+ 2+    Pathological Reflexes: Babinski: flexor response bilaterally Hoffman: positive bilaterally Troemner: absent bilaterally Sensation: Pinprick: Intact in all extremities Vibration: Intact in all extremities Proprioception: Intact in bilateral great toes Coordination: Intact finger-to- nose-finger bilaterally. Romberg negative. Gait: Able to  rise from chair with arms crossed unassisted. Normal, narrow-based gait. Able to tandem walk. Able to walk on toes and heels.  Lab and Test Review: Internal labs: 09/08/22: CMP unremarkable HbA1c: 5.3  05/25/22: dsDNA: < 1 SSA: 1.1 (< 1.0 is normal) antiSM negative RNP negative  03/29/22: TSH 0.98 ESR 27  Imaging: MRI right wrist (05/22/22): IMPRESSION: 1. Mild tendinosis of the extensor carpi ulnaris tendon with mild peripheral subluxation as can be seen with a subsheath injury. 2. 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 3.  No acute osseous injury of the right wrist.  CT head wo contrast (10/29/20): FINDINGS: Brain:   Cerebral volume is normal.   There is no acute intracranial hemorrhage.   No demarcated cortical infarct.   No extra-axial fluid collection.   No evidence of intracranial mass.   No midline shift.   Vascular: No hyperdense vessel.   Skull: Normal. Negative for fracture or focal lesion.   Sinuses/Orbits: Visualized orbits show no acute finding. No significant paranasal sinus disease at the imaged levels.   IMPRESSION: Unremarkable noncontrast CT appearance of the brain. No evidence of acute intracranial abnormality.  EMG (04/16/22 by Dr. Laurence Spates): EMG & NCV Findings: All nerve conduction studies (as indicated in the following tables) were within normal limits.     All examined muscles (as indicated in the following table) showed no evidence of electrical instability.     Impression: Essentially NORMAL electrodiagnostic study of the right upper limb.  There is no significant electrodiagnostic evidence of nerve entrapment, brachial plexopathy or cervical radiculopathy.  As you know, purely sensory or demyelinating radiculopathies and chemical radiculitis may not be detected with this particular electrodiagnostic study.   Recommendations: 1.  Follow-up with referring physician. 2.  Continue current management of symptoms.  ASSESSMENT: Ann Clark is a 37 y.o. female who presents for evaluation of pain, numbness, and tingling in bilateral hands and feet. She has a relevant medical history of vit D deficiency, bipolar I, insomnia, anxiety, former smoker. Her neurological examination is essentially normal. Previous EMG of RUE from 03/2022 was normal. The etiology of patient's symptoms is currently unclear. Given the previously normal EMG and normal exam today and previous robust response to celebrex, symptoms seem less likely secondary to primary neurologic cause such as neuropathy. I will look for treatable causes as a precaution. We discussed repeat EMG to rule out peripheral nerve process, but patient deferred, which is reasonable given the recent normal EMG.  PLAN: -Blood work: B1, B12, IFE -EMG discussed, but patient deferred   -Return to clinic as needed  The impression above as well as the plan as outlined below were extensively discussed with the patient who voiced understanding. All questions were answered to their satisfaction.  When available, results of the above investigations and possible further recommendations will be communicated to the patient via telephone/MyChart. Patient to call office if not contacted after  expected testing turnaround time.   Total time spent reviewing records, interview, history/exam, documentation, and coordination of care on day of encounter:  45 min   Thank you for allowing me to participate in patient's care.  If I can answer any additional questions, I would be pleased to do so.  Kai Levins, MD   CC: [name] Herter, NP [place] [id number]  CC: Referring provider: [name] Herter, NP [address] [place],  [place] [address]

## 2022-09-18 ENCOUNTER — Other Ambulatory Visit: Payer: Self-pay | Admitting: Family

## 2022-09-18 DIAGNOSIS — K219 Gastro-esophageal reflux disease without esophagitis: Secondary | ICD-10-CM

## 2022-09-18 DIAGNOSIS — G8929 Other chronic pain: Secondary | ICD-10-CM

## 2022-09-20 MED ORDER — OMEPRAZOLE 40 MG PO CPDR: 40.0000 mg | DELAYED_RELEASE_CAPSULE | Freq: Every day | ORAL | 0 refills | Status: DC

## 2022-09-20 MED ORDER — DICLOFENAC SODIUM 1 % EX GEL: 4.0000 g | Freq: Four times a day (QID) | CUTANEOUS | 0 refills | Status: DC

## 2022-09-23 ENCOUNTER — Encounter: Payer: Self-pay | Admitting: Neurology

## 2022-09-23 ENCOUNTER — Other Ambulatory Visit (INDEPENDENT_AMBULATORY_CARE_PROVIDER_SITE_OTHER): Payer: Medicaid Other

## 2022-09-23 ENCOUNTER — Ambulatory Visit (INDEPENDENT_AMBULATORY_CARE_PROVIDER_SITE_OTHER): Payer: Medicaid Other | Admitting: Neurology

## 2022-09-23 VITALS — BP 127/82 | HR 88 | Ht 62.0 in | Wt 157.0 lb

## 2022-09-23 DIAGNOSIS — R200 Anesthesia of skin: Secondary | ICD-10-CM

## 2022-09-23 DIAGNOSIS — R202 Paresthesia of skin: Secondary | ICD-10-CM | POA: Diagnosis not present

## 2022-09-23 DIAGNOSIS — R209 Unspecified disturbances of skin sensation: Secondary | ICD-10-CM | POA: Diagnosis not present

## 2022-09-23 DIAGNOSIS — M2550 Pain in unspecified joint: Secondary | ICD-10-CM

## 2022-09-23 NOTE — Patient Instructions (Addendum)
I am not sure of the cause of your symptoms. It does not appear linked to your nerves though, especially given your previous EMG was normal and that your neurologic exam is normal.  I will do some blood work today to look for vitamin deficiencies or abnormal proteins that could cause symptoms.  We discussed repeating EMG, but agreed it would likely not be helpful as it would likely be normal again.  I will be in touch when I have your results.  Please let me know if you have any questions or concerns in the meantime.   Follow up with me as needed.  The physicians and staff at [facility] are committed to providing excellent care. You may receive a survey requesting feedback about your experience at our office. We strive to receive "very good" responses to the survey questions. If you feel that your experience would prevent you from giving the office a "very good " response, please contact our office to try to remedy the situation. We may be reached at [phone]. Thank you for taking the time out of your busy day to complete the survey.  Kai Levins, MD [facility]

## 2022-09-27 ENCOUNTER — Other Ambulatory Visit: Payer: Self-pay

## 2022-09-27 ENCOUNTER — Other Ambulatory Visit: Payer: Self-pay | Admitting: *Deleted

## 2022-09-27 DIAGNOSIS — N760 Acute vaginitis: Secondary | ICD-10-CM

## 2022-09-27 MED ORDER — FLUCONAZOLE 150 MG PO TABS: 150.0000 mg | ORAL_TABLET | Freq: Once | ORAL | 0 refills | Status: AC

## 2022-09-27 NOTE — Telephone Encounter (Signed)
Requested medication (s) are due for refill today - no  Requested medication (s) are on the active medication list -no  Future visit scheduled -no  Last refill: years  Notes to clinic: Patient states she was recently treated for BV and now has yeast- is requesting Diflucan  Requested Prescriptions  Pending Prescriptions Disp Refills   fluconazole (DIFLUCAN) 150 MG tablet 1 tablet 0    Sig: Take 1 tablet (150 mg total) by mouth once for 1 dose.     Off-Protocol Failed - 09/27/2022  3:43 PM      Failed - Medication not assigned to a protocol, review manually.      Passed - Valid encounter within last 12 months    Recent Outpatient Visits           2 weeks ago Annual physical exam   [place] [facility], Amy J, NP   7 months ago Chronic constipation   [place] [facility], [place], NP   1 year ago Annual physical exam   [place] [facility], [place], NP   1 year ago Chronic pain of right ankle   [facility], [place], NP   2 years ago Annual physical exam   [place] [facility], Amy J, NP                 Requested Prescriptions  Pending Prescriptions Disp Refills   fluconazole (DIFLUCAN) 150 MG tablet 1 tablet 0    Sig: Take 1 tablet (150 mg total) by mouth once for 1 dose.     Off-Protocol Failed - 09/27/2022  3:43 PM      Failed - Medication not assigned to a protocol, review manually.      Passed - Valid encounter within last 12 months    Recent Outpatient Visits           2 weeks ago Annual physical exam   [place] [facility], [place], NP   7 months ago Chronic constipation   [place] [facility], [place], NP   1 year ago Annual physical exam   [place] [facility], [place], NP   1 year ago Chronic pain of right ankle     [facility] at [facility], [place], NP   2 years ago Annual physical exam   [facility], [place], NP

## 2022-09-27 NOTE — Telephone Encounter (Signed)
Patient states she was recently treated for BV and now has yeast infection. Patient is requesting Diflucan Rx

## 2022-09-30 LAB — IMMUNOFIXATION ELECTROPHORESIS
IgG (Immunoglobin G), Serum: 1279 mg/dL (ref 600–1640)
IgM, Serum: 127 mg/dL (ref 50–300)
Immunoglobulin A: 178 mg/dL (ref 47–310)

## 2022-09-30 LAB — VITAMIN B12: Vitamin B-12: 336 pg/mL (ref 200–1100)

## 2022-09-30 LAB — VITAMIN B1: Vitamin B1 (Thiamine): <6 nmol/L — ABNORMAL LOW (ref 8–30)

## 2022-10-01 ENCOUNTER — Telehealth: Payer: Self-pay | Admitting: Family

## 2022-10-01 NOTE — Telephone Encounter (Signed)
Pt is calling to speak to DJ to file complaint. Pt does not wish to discuss complaint. However states that she has previously discussed this with DJ. And would like this resolved immediately. Cb- [phone]

## 2023-01-10 ENCOUNTER — Other Ambulatory Visit: Payer: Self-pay | Admitting: Family

## 2023-01-10 DIAGNOSIS — G8929 Other chronic pain: Secondary | ICD-10-CM

## 2023-01-10 DIAGNOSIS — K219 Gastro-esophageal reflux disease without esophagitis: Secondary | ICD-10-CM

## 2023-01-11 MED ORDER — DICLOFENAC SODIUM 1 % EX GEL: 4.0000 g | Freq: Four times a day (QID) | CUTANEOUS | 0 refills | Status: AC

## 2023-01-11 MED ORDER — OMEPRAZOLE 40 MG PO CPDR: 40.0000 mg | DELAYED_RELEASE_CAPSULE | Freq: Every day | ORAL | 0 refills | Status: DC

## 2023-03-28 ENCOUNTER — Other Ambulatory Visit: Payer: Self-pay | Admitting: Family

## 2023-03-28 DIAGNOSIS — K219 Gastro-esophageal reflux disease without esophagitis: Secondary | ICD-10-CM

## 2023-03-28 MED ORDER — OMEPRAZOLE 40 MG PO CPDR: 40.0000 mg | DELAYED_RELEASE_CAPSULE | Freq: Every day | ORAL | 0 refills | Status: AC

## 2023-03-30 ENCOUNTER — Other Ambulatory Visit: Payer: Self-pay | Admitting: Family

## 2023-03-30 DIAGNOSIS — E559 Vitamin D deficiency, unspecified: Secondary | ICD-10-CM

## 2023-03-31 NOTE — Telephone Encounter (Signed)
Schedule appointment?

## 2023-04-07 NOTE — Telephone Encounter (Signed)
Schedule appointment?

## 2023-08-02 ENCOUNTER — Ambulatory Visit
Admission: EM | Admit: 2023-08-02 | Discharge: 2023-08-02 | Disposition: A | Discharge status: Home / Self Care | Payer: Medicaid Other | Attending: Family Medicine | Admitting: Family Medicine

## 2023-08-02 DIAGNOSIS — B9689 Other specified bacterial agents as the cause of diseases classified elsewhere: Secondary | ICD-10-CM

## 2023-08-02 DIAGNOSIS — J208 Acute bronchitis due to other specified organisms: Secondary | ICD-10-CM | POA: Diagnosis not present

## 2023-08-02 MED ORDER — BENZONATATE 200 MG PO CAPS: 200.0000 mg | ORAL_CAPSULE | Freq: Three times a day (TID) | ORAL | 0 refills | Status: AC | PRN

## 2023-08-02 MED ORDER — AZITHROMYCIN 250 MG PO TABS: 250.0000 mg | ORAL_TABLET | Freq: Every day | ORAL | 0 refills | Status: AC

## 2023-08-02 MED ORDER — ALBUTEROL SULFATE HFA 108 (90 BASE) MCG/ACT IN AERS: 1.0000 | INHALATION_SPRAY | Freq: Four times a day (QID) | RESPIRATORY_TRACT | 0 refills | Status: AC | PRN

## 2023-08-02 MED ORDER — PREDNISONE 20 MG PO TABS: 40.0000 mg | ORAL_TABLET | Freq: Every day | ORAL | 0 refills | Status: AC

## 2023-08-02 NOTE — Discharge Instructions (Addendum)
 Start Zithromax  as per.  He may take Tessalon  as needed for cough.  Prednisone  daily for 5 days with breakfast.  Albuterol  inhaler as she needed for shortness of breath.  Lots of rest and fluids.  Please follow-up with your PCP in 2 to 3 days for recheck.  Please go to the ER if you develop any worsening symptoms.  Hope you feel better soon!

## 2023-08-02 NOTE — ED Triage Notes (Addendum)
Pt presents to UC for c/o cough, chest pain, shortness of breath, weakness, body aches x1 week. States she's had a cough x2 weeks but worsened last week. Taking Tylenol and alkaseltzer

## 2023-08-02 NOTE — ED Provider Notes (Addendum)
 [facility]    CSN: [id number] Arrival date & time: 08/02/23  1428      History   Chief Complaint No chief complaint on file.   HPI Ann Clark is a 38 y.o. female  presents for evaluation of URI symptoms for 2 weeks. Patient reports associated symptoms of productive cough with purulent sputum, chest pain with coughing, shortness of breath, body aches, fatigue, shortness of breath. Denies N/V/D, fevers, ear pain, sore throat. Patient does not have a hx of asthma. Patient does have a history of smoking.  Reports was in the ER with her grandmother recently and exposed to multiple sick contacts..  Pt has taken Tylenol  and cough medicine OTC for symptoms.  Patient denies breast-feeding or pregnancy.  Pt has no other concerns at this time.   HPI  Past Medical History:  Diagnosis Date   Anemia    Anxiety    Preterm labor    Shoulder pain    Trichomonas infection 08/01/2015    Patient Active Problem List   Diagnosis Date Noted   Positive ANA (antinuclear antibody) 05/25/2022   Insomnia 06/25/2020   Acute cystitis without hematuria 06/11/2020   Bacterial vaginitis 06/11/2020   Anxiety state 06/11/2020   H. pylori infection 02/27/2020   Vitamin D  deficiency 02/27/2020   Myalgia 07/30/2015   GERD (gastroesophageal reflux disease) 07/30/2015   Arthralgia 07/16/2015   Bipolar I disorder (HCC) 07/16/2015    Past Surgical History:  Procedure Laterality Date   DILATION AND EVACUATION  01/22/2005   EUSTACHIAN TUBE DILATION     TONSILLECTOMY      OB History     Gravida  2   Para  1   Term      Preterm  1   AB      Living  1      SAB      IAB      Ectopic      Multiple      Live Births  1            Home Medications    Prior to Admission medications   Medication Sig Start Date End Date Taking? Authorizing Provider  albuterol  (VENTOLIN  HFA) 108 (90 Base) MCG/ACT inhaler Inhale 1-2 puffs into the lungs every 6 (six) hours as needed.  08/02/23  Yes Patton Rabinovich, Jodi R, NP  azithromycin  (ZITHROMAX ) 250 MG tablet Take 1 tablet (250 mg total) by mouth daily. Take first 2 tablets together, then 1 every day until finished. 08/02/23  Yes Kacin Dancy, Jodi R, NP  benzonatate  (TESSALON ) 200 MG capsule Take 1 capsule (200 mg total) by mouth 3 (three) times daily as needed. 08/02/23  Yes Bryla Burek, Jodi R, NP  cetirizine (ZYRTEC) 10 MG chewable tablet Chew 10 mg by mouth as needed.   Yes [provider]  omeprazole  (PRILOSEC) 40 MG capsule Take 1 capsule (40 mg total) by mouth daily. 03/28/23 09/24/23 Yes Lorren, Amy J, NP  predniSONE  (DELTASONE ) 20 MG tablet Take 2 tablets (40 mg total) by mouth daily with breakfast for 5 days. 08/02/23 08/07/23 Yes Kiven Vangilder, Jodi R, NP  Acetaminophen  (TYLENOL  PO) Take by mouth as needed.    [provider]  diclofenac  Sodium (VOLTAREN ) 1 % GEL Apply 4 g topically 4 (four) times daily. 01/11/23   Lorren Greig PARAS, NP  docusate sodium  (COLACE) 100 MG capsule Take 1 capsule (100 mg total) by mouth 2 (two) times daily as needed for mild constipation. 02/26/22   Lorren,  Amy J, NP  ibuprofen  (ADVIL ) 800 MG tablet Take 1 tablet (800 mg total) by mouth every 8 (eight) hours as needed. Patient not taking: Reported on 09/23/2022 09/08/22   Lorren Greig PARAS, NP    Family History Family History  Problem Relation Age of Onset   COPD Mother    Diabetes Mother    Hyperlipidemia Mother    Hypertension Mother    Anxiety disorder Mother    Arthritis Mother    Asthma Mother    Depression Mother    Drug abuse Mother    Vision loss Mother    Kidney disease Father    Drug abuse Father    Heart Problems Father    ADD / ADHD Sister    Heart disease Sister    Clotting disorder Sister    Hearing loss Son     Social History Social History   Tobacco Use   Smoking status: Some Days    Current packs/day: 0.00    Average packs/day: 0.1 packs/day for 5.0 years (0.5 ttl pk-yrs)    Types: Cigarettes    Start date: 2017    Last  attempt to quit: 2022    Years since quitting: 3.0    Passive exposure: Past   Smokeless tobacco: Never   Tobacco comments:    occasional  Vaping Use   Vaping status: Former  Substance Use Topics   Alcohol use: Not Currently   Drug use: Not Currently    Types: Marijuana    Comment: last use approx 12/2020     Allergies   Patient has no known allergies.   Review of Systems Review of Systems  Constitutional:  Positive for fatigue.  HENT:  Positive for congestion.   Respiratory:  Positive for cough and shortness of breath.   Musculoskeletal:  Positive for myalgias.     Physical Exam Triage Vital Signs ED Triage Vitals  Encounter Vitals Group     BP 08/02/23 1618 114/76     Systolic BP Percentile --      Diastolic BP Percentile --      Pulse Rate 08/02/23 1618 84     Resp 08/02/23 1618 18     Temp 08/02/23 1618 98.5 F (36.9 C)     Temp Source 08/02/23 1618 Oral     SpO2 08/02/23 1618 97 %     Weight --      Height --      Head Circumference --      Peak Flow --      Pain Score 08/02/23 1614 7     Pain Loc --      Pain Education --      Exclude from Growth Chart --    No data found.  Updated Vital Signs BP 114/76 (BP Location: Left Arm)   Pulse 84   Temp 98.5 F (36.9 C) (Oral)   Resp 18   SpO2 97%   Visual Acuity Right Eye Distance:   Left Eye Distance:   Bilateral Distance:    Right Eye Near:   Left Eye Near:    Bilateral Near:     Physical Exam Vitals and nursing note reviewed.  Constitutional:      General: She is not in acute distress.    Appearance: She is well-developed. She is not ill-appearing.  HENT:     Head: Normocephalic and atraumatic.     Right Ear: Tympanic membrane and ear canal normal.     Left Ear: Tympanic membrane  and ear canal normal.     Nose: Congestion present.     Mouth/Throat:     Mouth: Mucous membranes are moist.     Pharynx: Oropharynx is clear. Uvula midline. No oropharyngeal exudate or posterior oropharyngeal  erythema.     Tonsils: No tonsillar exudate or tonsillar abscesses.  Eyes:     Conjunctiva/sclera: Conjunctivae normal.     Pupils: Pupils are equal, round, and reactive to light.  Cardiovascular:     Rate and Rhythm: Normal rate and regular rhythm.     Heart sounds: Normal heart sounds.  Pulmonary:     Effort: Pulmonary effort is normal.     Breath sounds: Normal breath sounds. No wheezing or rhonchi.  Musculoskeletal:     Cervical back: Normal range of motion and neck supple.  Lymphadenopathy:     Cervical: No cervical adenopathy.  Skin:    General: Skin is warm and dry.  Neurological:     General: No focal deficit present.     Mental Status: She is alert and oriented to person, place, and time.  Psychiatric:        Mood and Affect: Mood normal.        Behavior: Behavior normal.      UC Treatments / Results  Labs (all labs ordered are listed, but only abnormal results are displayed) Labs Reviewed - No data to display  EKG   Radiology No results found.  Procedures Procedures (including critical care time)  Medications Ordered in UC Medications - No data to display  Initial Impression / Assessment and Plan / UC Course  I have reviewed the triage vital signs and the nursing notes.  Pertinent labs & imaging results that were available during my care of the patient were reviewed by me and considered in my medical decision making (see chart for details).     Reviewed exam and symptoms with patient.  No red flags.  Will start Zithromax  for bronchitis given length of symptoms.  Albuterol  and Tessalon  as needed.  Prednisone  daily for 5 days.  Advised PCP follow-up 2 to 3 days for recheck.  ER precautions reviewed and patient verbalized understanding. Final Clinical Impressions(s) / UC Diagnoses   Final diagnoses:  Acute bacterial bronchitis     Discharge Instructions      Start Zithromax  as per.  He may take Tessalon  as needed for cough.  Prednisone  daily for 5  days with breakfast.  Albuterol  inhaler as she needed for shortness of breath.  Lots of rest and fluids.  Please follow-up with your PCP in 2 to 3 days for recheck.  Please go to the ER if you develop any worsening symptoms.  Hope you feel better soon!   ED Prescriptions     Medication Sig Dispense Auth. Provider   azithromycin  (ZITHROMAX ) 250 MG tablet Take 1 tablet (250 mg total) by mouth daily. Take first 2 tablets together, then 1 every day until finished. 6 tablet Bexton Haak, Jodi R, NP   benzonatate  (TESSALON ) 200 MG capsule Take 1 capsule (200 mg total) by mouth 3 (three) times daily as needed. 20 capsule Braya Habermehl, Jodi R, NP   predniSONE  (DELTASONE ) 20 MG tablet Take 2 tablets (40 mg total) by mouth daily with breakfast for 5 days. 10 tablet Thomasene Dubow, Jodi R, NP   albuterol  (VENTOLIN  HFA) 108 (90 Base) MCG/ACT inhaler Inhale 1-2 puffs into the lungs every 6 (six) hours as needed. 1 each Elyza Whitt, Jodi R, NP      PDMP not  reviewed this encounter.   Loreda Myla SAUNDERS, NP 08/02/23 1640    Loreda Myla SAUNDERS, NP 08/02/23 1640

## 2023-10-14 ENCOUNTER — Other Ambulatory Visit: Payer: Self-pay | Admitting: Family Medicine

## 2023-10-14 DIAGNOSIS — Z1231 Encounter for screening mammogram for malignant neoplasm of breast: Secondary | ICD-10-CM

## 2024-05-14 ENCOUNTER — Ambulatory Visit
Admission: RE | Admit: 2024-05-14 | Discharge: 2024-05-14 | Disposition: A | Discharge status: Home / Self Care | Source: Ambulatory Visit | Attending: Family Medicine | Admitting: Family Medicine

## 2024-05-14 DIAGNOSIS — Z1231 Encounter for screening mammogram for malignant neoplasm of breast: Secondary | ICD-10-CM
